# Patient Record
Sex: Male | Born: 1950 | Race: Black or African American | Hispanic: No | Marital: Married | Smoking: Never smoker
Health system: Southern US, Community
[De-identification: ages and names within clinical notes are randomized; demographics above are authoritative.]

## PROBLEM LIST (undated history)

## (undated) DIAGNOSIS — I10 Essential (primary) hypertension: Secondary | ICD-10-CM

## (undated) DIAGNOSIS — E7800 Pure hypercholesterolemia, unspecified: Secondary | ICD-10-CM

## (undated) DIAGNOSIS — N400 Enlarged prostate without lower urinary tract symptoms: Secondary | ICD-10-CM

## (undated) DIAGNOSIS — B009 Herpesviral infection, unspecified: Secondary | ICD-10-CM

## (undated) HISTORY — DX: Essential (primary) hypertension: I10

## (undated) HISTORY — DX: Benign prostatic hyperplasia without lower urinary tract symptoms: N40.0

## (undated) HISTORY — DX: Herpesviral infection, unspecified: B00.9

## (undated) HISTORY — DX: Pure hypercholesterolemia, unspecified: E78.00

## (undated) HISTORY — PX: CYST REMOVAL TRUNK: SHX6283

## (undated) HISTORY — PX: ELBOW SURGERY: SHX618

---

## 2002-12-22 ENCOUNTER — Encounter (INDEPENDENT_AMBULATORY_CARE_PROVIDER_SITE_OTHER): Payer: Self-pay | Admitting: *Deleted

## 2002-12-22 ENCOUNTER — Ambulatory Visit (HOSPITAL_BASED_OUTPATIENT_CLINIC_OR_DEPARTMENT_OTHER): Admission: RE | Admit: 2002-12-22 | Discharge: 2002-12-22 | Payer: Self-pay | Admitting: Orthopedic Surgery

## 2005-06-14 ENCOUNTER — Ambulatory Visit: Payer: Self-pay | Admitting: Pulmonary Disease

## 2006-09-02 ENCOUNTER — Ambulatory Visit: Payer: Self-pay | Admitting: Pulmonary Disease

## 2006-09-02 LAB — CONVERTED CEMR LAB
ALT: 27 units/L (ref 0–40)
Bacteria, U Microscopic: NEGATIVE /hpf
Basophils Relative: 0.3 % (ref 0.0–1.0)
Bilirubin Urine: NEGATIVE
Calcium: 9.3 mg/dL (ref 8.4–10.5)
Chloride: 104 meq/L (ref 96–112)
Chol/HDL Ratio, serum: 4.3
Cholesterol: 245 mg/dL (ref 0–200)
Eosinophil percent: 1.3 % (ref 0.0–5.0)
Glucose, Bld: 97 mg/dL (ref 70–99)
HCT: 40.3 % (ref 39.0–52.0)
HDL: 56.5 mg/dL (ref >39.0)
Hemoglobin, Urine: NEGATIVE
LDL DIRECT: 172.5 mg/dL
Lymphocytes Relative: 36.9 % (ref 12.0–46.0)
MCV: 91.9 fL (ref 78.0–100.0)
Monocytes Absolute: 0.4 10*3/uL (ref 0.2–0.7)
Nitrite: NEGATIVE
PSA: 0.67 ng/mL (ref 0.10–4.00)
TSH: 1.19 microintl units/mL (ref 0.35–5.50)
Total Protein, Urine: NEGATIVE mg/dL
Triglyceride fasting, serum: 66 mg/dL (ref 0–149)
Urobilinogen, UA: 0.2 (ref 0.0–1.0)
VLDL: 13 mg/dL (ref 0–40)
WBC: 3.9 10*3/uL — ABNORMAL LOW (ref 4.5–10.5)
pH: 7.5 (ref 5.0–8.0)

## 2007-10-21 DIAGNOSIS — E7800 Pure hypercholesterolemia, unspecified: Secondary | ICD-10-CM | POA: Insufficient documentation

## 2007-11-24 ENCOUNTER — Ambulatory Visit: Payer: Self-pay | Admitting: Pulmonary Disease

## 2007-11-24 DIAGNOSIS — B009 Herpesviral infection, unspecified: Secondary | ICD-10-CM | POA: Insufficient documentation

## 2007-12-09 ENCOUNTER — Telehealth: Payer: Self-pay | Admitting: Pulmonary Disease

## 2009-01-05 ENCOUNTER — Ambulatory Visit: Payer: Self-pay | Admitting: Pulmonary Disease

## 2009-01-05 DIAGNOSIS — I10 Essential (primary) hypertension: Secondary | ICD-10-CM | POA: Insufficient documentation

## 2009-01-09 LAB — CONVERTED CEMR LAB
ALT: 21 units/L (ref 0–53)
AST: 24 units/L (ref 0–37)
BUN: 9 mg/dL (ref 6–23)
Eosinophils Relative: 1.7 % (ref 0.0–5.0)
GFR calc non Af Amer: 82.28 mL/min (ref >60)
HCT: 40.7 % (ref 39.0–52.0)
Hemoglobin: 13.8 g/dL (ref 13.0–17.0)
Ketones, ur: NEGATIVE mg/dL
LDL Cholesterol: 136 mg/dL — ABNORMAL HIGH (ref 0–99)
Lymphs Abs: 1.4 10*3/uL (ref 0.7–4.0)
Monocytes Relative: 11.6 % (ref 3.0–12.0)
Platelets: 231 10*3/uL (ref 150.0–400.0)
Potassium: 4.1 meq/L (ref 3.5–5.1)
RBC: 4.4 M/uL (ref 4.22–5.81)
Sodium: 139 meq/L (ref 135–145)
Specific Gravity, Urine: 1.01 (ref 1.000–1.030)
TSH: 1.28 microintl units/mL (ref 0.35–5.50)
Total Bilirubin: 0.9 mg/dL (ref 0.3–1.2)
Total CHOL/HDL Ratio: 4
Urine Glucose: NEGATIVE mg/dL
Urobilinogen, UA: 0.2 (ref 0.0–1.0)
VLDL: 12.6 mg/dL (ref 0.0–40.0)
WBC: 4 10*3/uL — ABNORMAL LOW (ref 4.5–10.5)
pH: 7 (ref 5.0–8.0)

## 2009-02-09 ENCOUNTER — Telehealth: Payer: Self-pay | Admitting: Pulmonary Disease

## 2009-08-09 ENCOUNTER — Telehealth: Payer: Self-pay | Admitting: Pulmonary Disease

## 2009-08-09 ENCOUNTER — Ambulatory Visit: Payer: Self-pay | Admitting: Pulmonary Disease

## 2009-08-09 LAB — CONVERTED CEMR LAB
Cholesterol: 171 mg/dL (ref 0–200)
HDL: 53.2 mg/dL (ref >39.00)
Triglycerides: 41 mg/dL (ref 0.0–149.0)

## 2010-03-01 ENCOUNTER — Telehealth (INDEPENDENT_AMBULATORY_CARE_PROVIDER_SITE_OTHER): Payer: Self-pay | Admitting: *Deleted

## 2010-03-03 ENCOUNTER — Ambulatory Visit: Payer: Self-pay | Admitting: Pulmonary Disease

## 2010-03-06 ENCOUNTER — Ambulatory Visit: Payer: Self-pay | Admitting: Pulmonary Disease

## 2010-03-07 LAB — CONVERTED CEMR LAB
ALT: 20 units/L (ref 0–53)
AST: 25 units/L (ref 0–37)
Albumin: 4.1 g/dL (ref 3.5–5.2)
Basophils Absolute: 0 10*3/uL (ref 0.0–0.1)
CO2: 30 meq/L (ref 19–32)
Calcium: 9.2 mg/dL (ref 8.4–10.5)
Chloride: 108 meq/L (ref 96–112)
Cholesterol: 194 mg/dL (ref 0–200)
Eosinophils Relative: 2.8 % (ref 0.0–5.0)
Monocytes Relative: 11.5 % (ref 3.0–12.0)
Neutrophils Relative %: 48.4 % (ref 43.0–77.0)
Nitrite: NEGATIVE
PSA: 0.9 ng/mL (ref 0.10–4.00)
Platelets: 251 10*3/uL (ref 150.0–400.0)
Sodium: 142 meq/L (ref 135–145)
Specific Gravity, Urine: 1.02 (ref 1.000–1.030)
TSH: 1.08 microintl units/mL (ref 0.35–5.50)
Total Protein: 7.6 g/dL (ref 6.0–8.3)
Urine Glucose: NEGATIVE mg/dL
Urobilinogen, UA: 0.2 (ref 0.0–1.0)
VLDL: 17.2 mg/dL (ref 0.0–40.0)
WBC: 4.3 10*3/uL — ABNORMAL LOW (ref 4.5–10.5)

## 2010-09-24 LAB — CONVERTED CEMR LAB
ALT: 22 units/L (ref 0–53)
Alkaline Phosphatase: 57 units/L (ref 39–117)
BUN: 7 mg/dL (ref 6–23)
Basophils Relative: 0.5 % (ref 0.0–1.0)
Bilirubin, Direct: 0.1 mg/dL (ref 0.0–0.3)
CO2: 33 meq/L — ABNORMAL HIGH (ref 19–32)
Crystals: NEGATIVE
Eosinophils Absolute: 0.1 10*3/uL (ref 0.0–0.7)
Eosinophils Relative: 1.9 % (ref 0.0–5.0)
Glucose, Bld: 93 mg/dL (ref 70–99)
HCT: 40.3 % (ref 39.0–52.0)
HDL: 56.2 mg/dL (ref >39.0)
Hemoglobin, Urine: NEGATIVE
Hemoglobin: 13.4 g/dL (ref 13.0–17.0)
MCV: 93.2 fL (ref 78.0–100.0)
Monocytes Absolute: 0.4 10*3/uL (ref 0.1–1.0)
Monocytes Relative: 10.9 % (ref 3.0–12.0)
Mucus, UA: NEGATIVE
Platelets: 233 10*3/uL (ref 150–400)
Potassium: 4.1 meq/L (ref 3.5–5.1)
RBC / HPF: NONE SEEN
RBC: 4.33 M/uL (ref 4.22–5.81)
Sodium: 143 meq/L (ref 135–145)
Total CHOL/HDL Ratio: 2.9
Total Protein, Urine: NEGATIVE mg/dL
Total Protein: 7.9 g/dL (ref 6.0–8.3)
Urine Glucose: NEGATIVE mg/dL
Urobilinogen, UA: 0.2 (ref 0.0–1.0)
WBC: 4 10*3/uL — ABNORMAL LOW (ref 4.5–10.5)

## 2010-09-26 NOTE — Assessment & Plan Note (Signed)
Summary: 6 months/apc   CC:  7 month ROV & review....  History of Present Illness: 60 y/o BM here for a follow up visit.. he has multiple medical problems as noted below... Followed for general medical purposes w/ hx of HBP, Hyperchol, BPH & hx HSVII.Marland Kitchen.   ~  August 09, 2009:  he has been taking the AZOR regularly & tol well- BP's at home have been 120-130's/ 70-80's... due for f/u FLP...   ~  March 06, 2010:  he noted some self-limited upper back pain several months ago- now resolved w/ heat & Ibuprofen... BP remains controlled on AZOR, but LDL is still sl elevated on Simva20 so we decided to incr to Simva40... no other complaints or concerns...    Current Problems:   PHYSICAL EXAMINATION (ICD-V70.0) - he is allergic to Sulfa drugs... he had routine colonoscopy by GI in [place] (DrMixon) 9/07 and it was neg... f/u planned 9yrs... I've reiterated my rec for ASA 81mg /d and MVI...  HYPERTENSION (ICD-401.9) - he's had borderline BP's on prev office visits in the past- up to 140's / 90's... BP today = 140/82 and he notes similar at home... denies HA, fatigue, visual changes, CP, palipit, dizziness, syncope, dyspnea, edema, etc...    ~  5/10:  we decided to start therapy w/ combination product- AZOR 5/40 one tab daily...  ~  12/10: he reports good BP control at home & reads 130/82 here today- tol Rx well, continue same.  ~  7/11: remains stable on the AZOR Rx> 140/82 & similar at home.  HYPERCHOLESTEROLEMIA (ICD-272.0) - on SIMVASTATIN 20mg /d... he is allergic to Lipitor w/ rash...  ~  FLP 1/08 showed TChol 245, TG 66, HDL 57, LDL 173... on diet alone, started Zocor 20/d...  ~  FLP 3/09 showed TChol 163, TG 96, HDL 56, LDL 88... rec> continue Simva20.  ~  FLP 5/10 on diet alone showed TChol 199, TG 63, HDL 51, LDL 136... rec restart SIMVA20.  ~  FLP 12/10 showed TChol 171, TG 41, HDL 53, LDL 110... continue Simva20 + diet.  ~  FLP 7/11 showed TChol 194, TG 86, HDL 58, LDL 119... he wants to get  LDL<100, rec incr Simva40.  BENIGN PROSTATIC HYPERTROPHY, MILD, HX OF (ICD-V13.8) - eval by DrCarbon @ [facility] and [facility] in [facility] urology w/ prev cysto... all symptoms resolved and denies current problems...  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 he gets occas outbreaks (?4-6/yr) and requests suppressive therapy- Rx Famvir 250mg Bid Prn.   Preventive Screening-Counseling & Management  Alcohol-Tobacco     Smoking Status: never  Allergies: 1)  ! Sulfa 2)  ! Lipitor (Atorvastatin Calcium)  Comments:  Nurse/Medical Assistant: The patient's medications and allergies were reviewed with the patient and were updated in the Medication and Allergy Lists.  Past History:  Past Medical History: HYPERTENSION (ICD-401.9) HYPERCHOLESTEROLEMIA (ICD-272.0) BENIGN PROSTATIC HYPERTROPHY, MILD, HX OF (ICD-V13.8) Hx of HSV (ICD-054.9)  Family History: Reviewed history from 01/05/2009 and no changes required. Father died age 49 w/ heart dis & CHF Mother died age 56 w/ stroke, hx heart dis, HBP 9 Siblings:  he is the youngest of 38... 2 Brothers passed- 1 age 58 ?cause, 1 died from lymphoma 7 Sibs alive in good health- 1 w/ DM  Social History: Reviewed history from 01/05/2009 and no changes required. Married, wife= Windell Moulding, 12 yrs... 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  Review of Systems  The patient denies fever, chills, sweats, anorexia, fatigue, weakness, malaise, weight loss, sleep disorder, blurring, diplopia, eye irritation, eye discharge, vision loss, eye pain, photophobia, earache, ear discharge, tinnitus, decreased hearing, nasal congestion, nosebleeds, sore throat, hoarseness, chest pain, palpitations, syncope, dyspnea on exertion, orthopnea, PND, peripheral edema, cough, dyspnea at rest, excessive sputum, hemoptysis, wheezing, pleurisy, nausea, vomiting,  diarrhea, constipation, change in bowel habits, abdominal pain, melena, hematochezia, jaundice, gas/bloating, indigestion/heartburn, dysphagia, odynophagia, dysuria, hematuria, urinary frequency, urinary hesitancy, nocturia, incontinence, back pain, joint pain, joint swelling, muscle cramps, muscle weakness, stiffness, arthritis, sciatica, restless legs, leg pain at night, leg pain with exertion, rash, itching, dryness, suspicious lesions, paralysis, paresthesias, seizures, tremors, vertigo, transient blindness, frequent falls, frequent headaches, difficulty walking, depression, anxiety, memory loss, confusion, cold intolerance, heat intolerance, polydipsia, polyphagia, polyuria, unusual weight change, abnormal bruising, bleeding, enlarged lymph nodes, urticaria, allergic rash, hay fever, and recurrent infections.    Vital Signs:  Patient profile:   60 year old male Height:      66 inches Weight:      175.50 pounds BMI:     28.43 O2 Sat:      99 % on Room air Temp:     97.8 degrees F oral Pulse rate:   57 / minute BP sitting:   140 / 82  (right arm) Cuff size:   regular  Vitals Entered By: Randell Loop CMA (March 06, 2010 2:16 PM)  O2 Sat at Rest %:  99 O2 Flow:  Room air CC: 7 month ROV & review... Is Patient Diabetic? No Pain Assessment Patient in pain? no      Comments no changes in meds today   Physical Exam  Additional Exam:  WD, WN, 60 y/o BM in NAD... GENERAL:  Alert & oriented; pleasant & cooperative... HEENT:  [place]/AT, EOM-wnl, PERRLA, Fundi-benign, EACs-clear, TMs-wnl, NOSE-clear, THROAT-clear & wnl. NECK:  Supple w/ full ROM; no JVD; normal carotid impulses w/o bruits; no thyromegaly or nodules palpated; no lymphadenopathy. CHEST:  Clear to P & A; without wheezes/ rales/ or rhonchi. HEART:  Regular Rhythm; without murmurs/ rubs/ or gallops. ABDOMEN:  Soft & nontender; normal bowel sounds; no organomegaly or masses detected. (RECTAL:  Neg - prostate 3+ & nontender w/o  nodules; stool hematest neg) EXT: without deformities or arthritic changes; no varicose veins/ venous insuffic/ or edema. NEURO:  CN's intact; motor testing normal; sensory testing normal; gait normal & balance OK. DERM:  No lesions noted; no rash etc...    MISC. Report  Procedure date:  03/03/2010  Findings:      Hepatic/Liver Function Panel (HEPATIC)   Total Bilirubin           0.7 mg/dL                   5.7-8.4   Direct Bilirubin          0.1 mg/dL                   6.9-6.2   Alkaline Phosphatase      53 U/L                      39-117   AST                       25 U/L  0-37   ALT                       20 U/L                      0-53   Total Protein             7.6 g/dL                    1.6-1.0   Albumin                   4.1 g/dL                    9.6-0.4  CBC Platelet w/Diff (CBCD)   White Cell Count     [L]  4.3 K/uL                    4.5-10.5   Red Cell Count       [L]  4.12 Mil/uL                 4.22-5.81   Hemoglobin                13.1 g/dL                   54.0-98.1   Hematocrit           [L]  38.4 %                      39.0-52.0   MCV                       93.1 fl                     78.0-100.0   Platelet Count            251.0 K/uL                  150.0-400.0   Neutrophil %              48.4 %                      43.0-77.0   Lymphocyte %              36.4 %                      12.0-46.0   Monocyte %                11.5 %                      3.0-12.0   Eosinophils%              2.8 %                       0.0-5.0   Basophils %               0.9 %                       0.0-3.0  TSH (TSH)   FastTSH                   1.08 uIU/mL  0.35-5.50  BMP (METABOL)   Sodium                    142 mEq/L                   135-145   Potassium                 4.4 mEq/L                   3.5-5.1   Chloride                  108 mEq/L                   96-112   Carbon Dioxide            30 mEq/L                    19-32   Glucose                    97 mg/dL                    04-54   BUN                       10 mg/dL                    0-98   Creatinine                0.9 mg/dL                   1.1-9.1   Calcium                   9.2 mg/dL                   4.7-82.9   GFR                       90.22 mL/min                >60  Comments:      UDip Only (UDIP)   Color                     LT. YELLOW   Clarity                   CLEAR                       Clear   Specific Gravity          1.020                       1.000 - 1.030   Urine Ph                  7.0                         5.0-8.0   Protein                   NEGATIVE                    Negative   Urine Glucose  NEGATIVE                    Negative   Ketones                   NEGATIVE                    Negative   Urine Bilirubin           NEGATIVE                    Negative   Blood                     NEGATIVE                    Negative   Urobilinogen              0.2                         0.0 - 1.0   Leukocyte Esterace        NEGATIVE                    Negative   Nitrite                   NEGATIVE                    Negative  Prostate Specific Antigen (PSA)   PSA-Hyb                   0.90 ng/mL                  0.10-4.00  Lipid Panel (LIPID)   Cholesterol               194 mg/dL                   8-756   Triglycerides             86.0 mg/dL                  4.3-329.5   HDL                       18.84 mg/dL                 >16.60   LDL Cholesterol      [H]  630 mg/dL                   1-60   Impression & Recommendations:  Problem # 1:  HYPERTENSION (ICD-401.9) Controlled>  same Rx, refilled. His updated medication list for this problem includes:    Azor 5-40 Mg Tabs (Amlodipine-olmesartan) .Marland Kitchen... Take 1 tab by mouth once daily...  Problem # 2:  HYPERCHOLESTEROLEMIA (ICD-272.0) We reviewed FLP & LDL 119... he wants to get his numbers closer to goal <100...  REC> increase SIMVA40... His updated medication list for this problem  includes:    Simvastatin 40 Mg Tabs (Simvastatin) .Marland Kitchen... Take 1 tab by mouth at bedtime...  Problem # 3:  BENIGN PROSTATIC HYPERTROPHY, MILD, HX OF (ICD-V13.8) He is asymptomatic  and PSA = 0.90...  Complete Medication List: 1)  Azor 5-40 Mg Tabs (Amlodipine-olmesartan) .... Take 1 tab by mouth once daily.Marland KitchenMarland Kitchen 2)  Simvastatin 40 Mg Tabs (Simvastatin) .... Take  1 tab by mouth at bedtime.Marland KitchenMarland Kitchen 3)  Famvir 250 Mg Tabs (Famciclovir) .Marland Kitchen.. 1 tab by mouth two times a day as directed...  Patient Instructions: 1)  Today we updated your med list- see below.... 2)  We decided to increase the Simvastatin to 40mg  at bedtime.Marland KitchenMarland Kitchen 3)  Continue your low cholesterol/ low fat diet.Marland KitchenMarland Kitchen 4)  Monitor your BP at home & call for questions.Marland KitchenMarland Kitchen 5)  Please schedule a follow-up appointment in 1 year. Prescriptions: SIMVASTATIN 40 MG TABS (SIMVASTATIN) take 1 tab by mouth at bedtime...  #30 x 12   Entered and Authorized by:   Michele Mcalpine MD   Signed by:   Michele Mcalpine MD on 03/06/2010   Method used:   Print then Give to Patient   RxID:   [id number] AZOR 5-40 MG TABS (AMLODIPINE-OLMESARTAN) take 1 tab by mouth once daily...  #30 x 12   Entered and Authorized by:   Michele Mcalpine MD   Signed by:   Michele Mcalpine MD on 03/06/2010   Method used:   Print then Give to Patient   RxID:   [id number]

## 2010-09-26 NOTE — Progress Notes (Signed)
Summary: put in labs  Phone Note Call from Patient Call back at Home Phone [phone]   Caller: Patient Call For: nadel Summary of Call: pt has appt 7/11. wants to do his labs this week.  Initial call taken by: Tivis Ringer, CNA,  March 01, 2010 3:13 PM  Follow-up for Phone Call        last full labs done 5.2010.  order placed in idx fro hepatic, tsh, udip, psa, cbcd, flp, bmet (same labs done last year).  pt aware order has been placed, the time the lab opens and to arrive for lab work fasting.  pt verbalized his understanding. Follow-up by: Boone Master CNA/MA,  March 01, 2010 3:20 PM

## 2010-11-22 ENCOUNTER — Other Ambulatory Visit: Payer: Self-pay | Admitting: Pulmonary Disease

## 2011-01-12 NOTE — Op Note (Signed)
   NAME:  Jared Roth, BERGFELD                            ACCOUNT NO.:  [id number]   MEDICAL RECORD NO.:  [id number]                   PATIENT TYPE:  AMB   LOCATION:  DSC                                  FACILITY:  [facility]   PHYSICIAN:  Leonides Grills, M.D.                  DATE OF BIRTH:  [birth year]   DATE OF PROCEDURE:  12/22/2002  DATE OF DISCHARGE:                                 OPERATIVE REPORT   PREOPERATIVE DIAGNOSIS:  Left second metatarsal osteochondroma.   POSTOPERATIVE DIAGNOSIS:  Most likely left second metatarsal osteochondroma.   PROCEDURE:  Excision, deep left second metatarsal osteochondroma.   ANESTHESIA:  General endotracheal tube with popliteal block.   SURGEON:  Leonides Grills, M.D.   ASSISTANT:  Lianne Cure, P.A.   ESTIMATED BLOOD LOSS:  Minimal.   COMPLICATIONS:  None.   DISPOSITION:  Stable to the PAR.   INDICATIONS:  This is a 59 year old male who has had a slow-growing mass on  the left second metatarsal that has been symptomatic and interfering with  his life.  After obtaining an MRI that was consistent with an  osteochondroma, he was consented for the above procedure.  All risks, which  include infection, nerve or vessel injury, possibility of malignancy with  future surgery, pathologic fracture, swelling, and recurrence, were all  explained and questions encouraged and answered.   DESCRIPTION OF PROCEDURE:  Patient brought to the operating room and placed  in supine position after adequate general endotracheal tube anesthesia was  administered as well as a popliteal block.  The left lower extremity was  then prepped and draped in a sterile manner over a proximally-placed thigh  tourniquet.  The limb was gravity-exsanguinated and the tourniquet was  elevated to 290 mmHg.  A longitudinal incision over the lesion was then made  dorsally.  Dissection was carried down through soft tissue.  A longitudinal  incision over the incision was then made dorsally.   Dissection was carried  down through soft tissue.  Soft tissues were dissected both medially and  laterally around the lesion.  Once the lesion was isolated out with  meticulous dissection, a curved quarter-inch osteotome was used and at the  base of the lesion, this was osteotomized.  The wound was copiously  irrigated with normal saline, the tourniquet was deflated, hemostasis was  obtained.  The wound was closed with 4-0 nylon suture.  A sterile dressing  was applied.  The patient was stable to the PAR.                                               Leonides Grills, M.D.    PB/MEDQ  D:  12/22/2002  T:  12/22/2002  Job:  [id number]

## 2011-02-01 ENCOUNTER — Encounter: Payer: Self-pay | Admitting: *Deleted

## 2011-03-06 ENCOUNTER — Ambulatory Visit: Payer: Self-pay | Admitting: Pulmonary Disease

## 2011-03-23 ENCOUNTER — Ambulatory Visit (INDEPENDENT_AMBULATORY_CARE_PROVIDER_SITE_OTHER): Payer: Managed Care, Other (non HMO) | Admitting: Pulmonary Disease

## 2011-03-23 ENCOUNTER — Ambulatory Visit (INDEPENDENT_AMBULATORY_CARE_PROVIDER_SITE_OTHER)
Admission: RE | Admit: 2011-03-23 | Discharge: 2011-03-23 | Disposition: A | Discharge status: Home / Self Care | Payer: Managed Care, Other (non HMO) | Source: Ambulatory Visit | Attending: Pulmonary Disease | Admitting: Pulmonary Disease

## 2011-03-23 ENCOUNTER — Other Ambulatory Visit (INDEPENDENT_AMBULATORY_CARE_PROVIDER_SITE_OTHER): Payer: Managed Care, Other (non HMO)

## 2011-03-23 ENCOUNTER — Other Ambulatory Visit (INDEPENDENT_AMBULATORY_CARE_PROVIDER_SITE_OTHER): Payer: Managed Care, Other (non HMO) | Admitting: Pulmonary Disease

## 2011-03-23 ENCOUNTER — Encounter: Payer: Self-pay | Admitting: Pulmonary Disease

## 2011-03-23 VITALS — BP 144/88 | HR 56 | Temp 97.5°F | Ht 66.0 in | Wt 175.6 lb

## 2011-03-23 DIAGNOSIS — Z0000 Encounter for general adult medical examination without abnormal findings: Secondary | ICD-10-CM

## 2011-03-23 DIAGNOSIS — Z87898 Personal history of other specified conditions: Secondary | ICD-10-CM

## 2011-03-23 DIAGNOSIS — E7800 Pure hypercholesterolemia, unspecified: Secondary | ICD-10-CM

## 2011-03-23 DIAGNOSIS — I10 Essential (primary) hypertension: Secondary | ICD-10-CM

## 2011-03-23 LAB — HEPATIC FUNCTION PANEL
ALT: 26 U/L (ref 0–53)
Alkaline Phosphatase: 54 U/L (ref 39–117)
Bilirubin, Direct: 0.1 mg/dL (ref 0.0–0.3)
Total Protein: 7.7 g/dL (ref 6.0–8.3)

## 2011-03-23 LAB — BASIC METABOLIC PANEL
CO2: 30 mEq/L (ref 19–32)
Chloride: 104 mEq/L (ref 96–112)
Potassium: 4.3 mEq/L (ref 3.5–5.1)
Sodium: 141 mEq/L (ref 135–145)

## 2011-03-23 LAB — CBC WITH DIFFERENTIAL/PLATELET
Basophils Absolute: 0.1 10*3/uL (ref 0.0–0.1)
Eosinophils Absolute: 0.1 10*3/uL (ref 0.0–0.7)
Lymphocytes Relative: 33.6 % (ref 12.0–46.0)
MCHC: 33.8 g/dL (ref 30.0–36.0)
MCV: 92.7 fl (ref 78.0–100.0)
Monocytes Absolute: 0.4 10*3/uL (ref 0.1–1.0)
Neutro Abs: 2.1 10*3/uL (ref 1.4–7.7)
Neutrophils Relative %: 52.4 % (ref 43.0–77.0)
RDW: 13 % (ref 11.5–14.6)

## 2011-03-23 LAB — URINALYSIS, ROUTINE W REFLEX MICROSCOPIC
Ketones, ur: NEGATIVE
Specific Gravity, Urine: >=1.03 (ref 1.000–1.030)
Urine Glucose: NEGATIVE
pH: 6 (ref 5.0–8.0)

## 2011-03-23 LAB — LIPID PANEL
Total CHOL/HDL Ratio: 3
Triglycerides: 66 mg/dL (ref 0.0–149.0)

## 2011-03-23 MED ORDER — AMLODIPINE-OLMESARTAN 5-40 MG PO TABS: 1.0000 | ORAL_TABLET | Freq: Every day | ORAL | Status: DC

## 2011-03-23 MED ORDER — FAMCICLOVIR 250 MG PO TABS: 250.0000 mg | ORAL_TABLET | Freq: Two times a day (BID) | ORAL | Status: DC

## 2011-03-23 MED ORDER — SIMVASTATIN 20 MG PO TABS: 20.0000 mg | ORAL_TABLET | Freq: Every day | ORAL | Status: DC

## 2011-03-23 NOTE — Progress Notes (Signed)
Subjective:    Patient ID: Jared Roth, male    DOB: 06-19-1951, 60 y.o.   MRN: [id number]  HPI 60 y/o BM here for a follow up visit.. he has multiple medical problems as noted below... Followed for general medical purposes w/ hx of HBP, Hyperchol, BPH & hx HSVII.Marland Kitchen.  ~  August 09, 2009:  he has been taking the AZOR regularly & tol well- BP's at home have been 120-130's/ 70-80's... due for f/u FLP...  ~  March 06, 2010:  he noted some self-limited upper back pain several months ago- now resolved w/ heat & Ibuprofen... BP remains controlled on AZOR, but LDL is still sl elevated on Simva20 so we decided to incr to Simva40... no other complaints or concerns...  ~  March 23, 2011:  Yearly ROV & CPX> he notes some intermittent heartburn that he relates to emotional upset & it affects his breathing, no CP/ palpit/ etc; occurs 1-2 per week, lasts a few seconds, & relief is spont; we discussed further eval if it increasing in freq or severity, for now try Prilosec OTC before breakfast...  BP appears well controlled on AZOR;  Chol well managed on the Simva40;  The only issue is med compliance> not taking them every day & he is encouraged to take regularly...   Problem List:   PHYSICAL EXAMINATION (ICD-V70.0) - he is allergic to Sulfa drugs... he had routine colonoscopy by GI in [place] (DrMixon) 9/07 and it was neg... f/u planned 63yrs... I've reiterated my rec for ASA 81mg /d and MVI... ~  CXR 7/12 showed normal heart size, clear lungs, NAD... ~  EKG 7/12 showed NSR, WNL...  HYPERTENSION (ICD-401.9) - on AZOR 5-40 daily & tol well... BP today = 144/88 but he ran out of med several days ago... denies HA, fatigue, visual changes, CP, palipit, dizziness, syncope, dyspnea, edema, etc...   ~  5/10:  we decided to start therapy w/ combination product- AZOR 5/40 one tab daily... ~  12/10: he reports good BP control at home & reads 130/82 here today- tol Rx well, continue same. ~  7/11: remains stable on the  AZOR Rx> 140/82 & similar at home. ~  7/12: stable on AZOR 5-40 per his home BP checks; BP 144/88 in office but ran out several days he said.  HYPERCHOLESTEROLEMIA (ICD-272.0) - on SIMVASTATIN 40mg /d... he is allergic to Lipitor w/ rash... ~  FLP 1/08 showed TChol 245, TG 66, HDL 57, LDL 173... on diet alone, started Zocor 20/d... ~  FLP 3/09 showed TChol 163, TG 96, HDL 56, LDL 88... rec> continue Simva20. ~  FLP 5/10 on diet alone showed TChol 199, TG 63, HDL 51, LDL 136... rec restart SIMVA20. ~  FLP 12/10 showed TChol 171, TG 41, HDL 53, LDL 110... continue Simva20 + diet. ~  FLP 7/11 on Simva20 showed TChol 194, TG 86, HDL 58, LDL 119... he wants to get LDL<100, rec incr Simva40. ~  FLP 7/12 on Simva40 showed TChol 166, TG 66, HDL 56, LDL 96... Continue same + diet...  BENIGN PROSTATIC HYPERTROPHY, MILD, HX OF (ICD-V13.8) - eval by DrCarbon @ [facility] and Dr[facility] in [facility] urology w/ prev cysto... all symptoms resolved and denies current problems... 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 he gets occas outbreaks (?4-6/yr) and requests suppressive therapy- Rx Famvir 250mg Bid Prn.   No past surgical history on file.   Outpatient Encounter Prescriptions  as of 03/23/2011  Medication Sig Dispense Refill  . amLODipine-olmesartan (AZOR) 5-40 MG per tablet Take 1 tablet by mouth daily.  30 tablet  11  . famciclovir (FAMVIR) 250 MG tablet Take 1 tablet (250 mg total) by mouth 2 (two) times daily.  60 tablet  11  . simvastatin (ZOCOR) 40 MG tablet Take 1 tablet (40 mg total) by mouth at bedtime.  30 tablet  11    Allergies  Allergen Reactions  . Atorvastatin     REACTION: INTOL Lipitor w/ rash  . Sulfonamide Derivatives     REACTION: as a child    Current Medications, Allergies, Past Medical History, Past Surgical History, Family History, and Social History were reviewed in [employer] record.    Review of  Systems    The patient denies fever, chills, sweats, anorexia, fatigue, weakness, malaise, weight loss, sleep disorder, blurring, diplopia, eye irritation, eye discharge, vision loss, eye pain, photophobia, earache, ear discharge, tinnitus, decreased hearing, nasal congestion, nosebleeds, sore throat, hoarseness, chest pain, palpitations, syncope, dyspnea on exertion, orthopnea, PND, peripheral edema, cough, dyspnea at rest, excessive sputum, hemoptysis, wheezing, pleurisy, nausea, vomiting, diarrhea, constipation, change in bowel habits, abdominal pain, melena, hematochezia, jaundice, gas/bloating, indigestion/heartburn, dysphagia, odynophagia, dysuria, hematuria, urinary frequency, urinary hesitancy, nocturia, incontinence, back pain, joint pain, joint swelling, muscle cramps, muscle weakness, stiffness, arthritis, sciatica, restless legs, leg pain at night, leg pain with exertion, rash, itching, dryness, suspicious lesions, paralysis, paresthesias, seizures, tremors, vertigo, transient blindness, frequent falls, frequent headaches, difficulty walking, depression, anxiety, memory loss, confusion, cold intolerance, heat intolerance, polydipsia, polyphagia, polyuria, unusual weight change, abnormal bruising, bleeding, enlarged lymph nodes, urticaria, allergic rash, hay fever, and recurrent infections.     Objective:   Physical Exam      WD, WN, 60 y/o BM in NAD... GENERAL:  Alert & oriented; pleasant & cooperative... HEENT:  [place]/AT, EOM-wnl, PERRLA, Fundi-benign, EACs-clear, TMs-wnl, NOSE-clear, THROAT-clear & wnl. NECK:  Supple w/ full ROM; no JVD; normal carotid impulses w/o bruits; no thyromegaly or nodules palpated; no lymphadenopathy. CHEST:  Clear to P & A; without wheezes/ rales/ or rhonchi. HEART:  Regular Rhythm; without murmurs/ rubs/ or gallops. ABDOMEN:  Soft & nontender; normal bowel sounds; no organomegaly or masses detected. (RECTAL:  Neg - prostate 3+ & nontender w/o nodules; stool  hematest neg) EXT: without deformities or arthritic changes; no varicose veins/ venous insuffic/ or edema. NEURO:  CN's intact; motor testing normal; sensory testing normal; gait normal & balance OK. DERM:  No lesions noted; no rash etc...   Assessment & Plan:   CPX>  Good general health; not sure what to make of his symptom- very brief (just seconds), resolves spont, only 1-2/wk- so we decided to try OTC Prilosec & rec further eval if persistant or worsening symptoms...  HBP>  AZOR appears to be working well> rec taking it every day!  CHOL>  Simva40 working satis & again rec taking every day + diet & exercise discussed...  GU>  Stable & asymptomatic at present; PSA is wnl.Marland KitchenMarland Kitchen

## 2011-03-23 NOTE — Patient Instructions (Signed)
Today we updated your med list in EPIC...    Be sure to take your meds regularly!!!  Today we did your follow up CXR & fasting blood work...    Please call the PHONE TREE in a few days for your results...    Dial [id number] & when prompted enter your patient number followed by the # symbol...    Your patient number is:   [id number]#  Call for any problems.Marland KitchenMarland Kitchen

## 2011-03-25 ENCOUNTER — Encounter: Payer: Self-pay | Admitting: Pulmonary Disease

## 2011-03-26 ENCOUNTER — Other Ambulatory Visit: Payer: Self-pay | Admitting: *Deleted

## 2011-03-26 MED ORDER — SIMVASTATIN 40 MG PO TABS: 20.0000 mg | ORAL_TABLET | Freq: Every day | ORAL | Status: DC

## 2011-03-27 ENCOUNTER — Other Ambulatory Visit: Payer: Self-pay | Admitting: *Deleted

## 2011-03-27 MED ORDER — SIMVASTATIN 40 MG PO TABS: 40.0000 mg | ORAL_TABLET | Freq: Every day | ORAL | Status: DC

## 2011-06-21 ENCOUNTER — Telehealth: Payer: Self-pay | Admitting: Pulmonary Disease

## 2011-06-21 NOTE — Telephone Encounter (Signed)
LMOM for pt TCB.  Reviewed pt's chart, I don't believe he has ever been on either of these meds before.

## 2011-06-22 MED ORDER — TADALAFIL 20 MG PO TABS: ORAL_TABLET | ORAL | Status: DC

## 2011-06-22 NOTE — Telephone Encounter (Signed)
Called, spoke with pt.  Informed SN is ok with the cialis 20 mg.  He verbalized understanding of this and aware rx sent to [facility].

## 2011-06-22 NOTE — Telephone Encounter (Signed)
Per SN---ok for cialis 20mg    #10  Take as directed and refill prn.  thanks

## 2011-06-22 NOTE — Telephone Encounter (Signed)
Called, spoke with pt.  He is requesting a rx for cialis to try this medication.  States approx 2-3 years ago he was given a sample of viagra from the urologist but after taking it, his vision was "bright."  Dr. Kriste Basque, pls advise. Thanks!  [facility] [address]

## 2011-09-11 ENCOUNTER — Ambulatory Visit: Payer: Managed Care, Other (non HMO) | Admitting: Adult Health

## 2011-09-12 ENCOUNTER — Ambulatory Visit: Payer: Managed Care, Other (non HMO) | Admitting: Adult Health

## 2011-11-19 ENCOUNTER — Ambulatory Visit: Payer: Managed Care, Other (non HMO) | Admitting: Adult Health

## 2011-11-20 ENCOUNTER — Encounter: Payer: Self-pay | Admitting: Adult Health

## 2011-11-20 ENCOUNTER — Ambulatory Visit (INDEPENDENT_AMBULATORY_CARE_PROVIDER_SITE_OTHER): Payer: Managed Care, Other (non HMO) | Admitting: Adult Health

## 2011-11-20 DIAGNOSIS — R079 Chest pain, unspecified: Secondary | ICD-10-CM

## 2011-11-20 DIAGNOSIS — K219 Gastro-esophageal reflux disease without esophagitis: Secondary | ICD-10-CM

## 2011-11-20 NOTE — Progress Notes (Signed)
Subjective:    Patient ID: Jared Roth, male    DOB: 12-13-1950, 61 y.o.   MRN: [id number]  Shortness of Breath   61 y/o BM here for a follow up visit.. he has multiple medical problems as noted below... Followed for general medical purposes w/ hx of HBP, Hyperchol, BPH & hx HSVII.Marland Kitchen.  ~  August 09, 2009:  he has been taking the AZOR regularly & tol well- BP's at home have been 120-130's/ 70-80's... due for f/u FLP...  ~  March 06, 2010:  he noted some self-limited upper back pain several months ago- now resolved w/ heat & Ibuprofen... BP remains controlled on AZOR, but LDL is still sl elevated on Simva20 so we decided to incr to Simva40... no other complaints or concerns...  ~  March 23, 2011:  Yearly ROV & CPX> he notes some intermittent heartburn that he relates to emotional upset & it affects his breathing, no CP/ palpit/ etc; occurs 1-2 per week, lasts a few seconds, & relief is spont; we discussed further eval if it increasing in freq or severity, for now try Prilosec OTC before breakfast...  BP appears well controlled on AZOR;  Chol well managed on the Simva40;  The only issue is med compliance> not taking them every day & he is encouraged to take regularly...    11/20/2011  Pt c/o sob x7-2mo. Pt states that the "attacks" occur with/without exertion.  Occurs for few seconds. Comes and goes. Xray last ov was neg. Labs unrevealing Some anxiety, EKG was okay. Had this last ov with Dr. Kriste Basque  . Was recommended to take Prilosec , unsure if it helped.  Has a lot of reflux , heartburn and indigestion. 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 Has occasional Trouble swallowing . No choking.  No hemoptysis, exertional chest pain , n/v/d, edema.     Problem List:   PHYSICAL EXAMINATION (ICD-V70.0) - he is allergic to Sulfa drugs... he had routine colonoscopy by GI in [place] (DrMixon) 9/07 and it was neg... f/u planned 71yrs... I've reiterated my rec for  ASA 81mg /d and MVI... ~  CXR 7/12 showed normal heart size, clear lungs, NAD... ~  EKG 7/12 showed NSR, WNL...  HYPERTENSION (ICD-401.9) - on AZOR 5-40 daily & tol well... BP today = 144/88 but he ran out of med several days ago... denies HA, fatigue, visual changes, CP, palipit, dizziness, syncope, dyspnea, edema, etc...   ~  5/10:  we decided to start therapy w/ combination product- AZOR 5/40 one tab daily... ~  12/10: he reports good BP control at home & reads 130/82 here today- tol Rx well, continue same. ~  7/11: remains stable on the AZOR Rx> 140/82 & similar at home. ~  7/12: stable on AZOR 5-40 per his home BP checks; BP 144/88 in office but ran out several days he said.  HYPERCHOLESTEROLEMIA (ICD-272.0) - on SIMVASTATIN 40mg /d... he is allergic to Lipitor w/ rash... ~  FLP 1/08 showed TChol 245, TG 66, HDL 57, LDL 173... on diet alone, started Zocor 20/d... ~  FLP 3/09 showed TChol 163, TG 96, HDL 56, LDL 88... rec> continue Simva20. ~  FLP 5/10 on diet alone showed TChol 199, TG 63, HDL 51, LDL 136... rec restart SIMVA20. ~  FLP 12/10 showed TChol 171, TG 41, HDL 53, LDL 110... continue Simva20 + diet. ~  FLP 7/11 on Simva20 showed TChol 194, TG 86, HDL 58, LDL 119.Marland KitchenMarland Kitchen  he wants to get LDL<100, rec incr Simva40. ~  FLP 7/12 on Simva40 showed TChol 166, TG 66, HDL 56, LDL 96... Continue same + diet...  BENIGN PROSTATIC HYPERTROPHY, MILD, HX OF (ICD-V13.8) - eval by DrCarbon @ [facility] and DrDavis in [facility] urology w/ prev cysto... all symptoms resolved and denies current problems... 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 he gets occas outbreaks (?4-6/yr) and requests suppressive therapy- Rx Famvir 250mg Bid Prn.   No past surgical history on file.   Outpatient Encounter Prescriptions as of 03/23/2011  Medication Sig Dispense Refill  . amLODipine-olmesartan (AZOR) 5-40 MG per tablet Take 1 tablet by mouth daily.  30 tablet  11  .  famciclovir (FAMVIR) 250 MG tablet Take 1 tablet (250 mg total) by mouth 2 (two) times daily.  60 tablet  11  . simvastatin (ZOCOR) 40 MG tablet Take 1 tablet (40 mg total) by mouth at bedtime.  30 tablet  11    Allergies  Allergen Reactions  . Atorvastatin     REACTION: INTOL Lipitor w/ rash  . Sulfonamide Derivatives     REACTION: as a child    Current Medications, Allergies, Past Medical History, Past Surgical History, Family History, and Social History were reviewed in [employer] record.    Review of Systems  Respiratory: Positive for shortness of breath.        Constitutional:   No  weight loss, night sweats,  Fevers, chills, fatigue, or  lassitude.  HEENT:   No headaches,  Difficulty swallowing,  Tooth/dental problems, or  Sore throat,                No sneezing, itching, ear ache, nasal congestion, post nasal drip,   CV:  No chest pain,  Orthopnea, PND, swelling in lower extremities, anasarca, dizziness, palpitations, syncope.   GI  No  nausea, vomiting, diarrhea, change in bowel habits, loss of appetite, bloody stools.   Resp:   No excess mucus, no productive cough,  No non-productive cough,  No coughing up of blood.  No change in color of mucus.  No wheezing.  No chest wall deformity  Skin: no rash or lesions.  GU: no dysuria, change in color of urine, no urgency or frequency.  No flank pain, no hematuria   MS:  No joint pain or swelling.  No decreased range of motion.  No back pain.  Psych:  No change in mood or affect. No depression or anxiety.  No memory loss.       Objective:   Physical Exam       WD, WN, 62 y/o BM in NAD... GENERAL:  Alert & oriented; pleasant & cooperative... HEENT:  [place]/AT, , EACs-clear, TMs-wnl, NOSE-clear, THROAT-clear & wnl. NECK:  Supple w/ full ROM; no JVD; normal carotid impulses w/o bruits; no thyromegaly or nodules palpated; no lymphadenopathy. CHEST:  Clear to P & A; without wheezes/ rales/ or  rhonchi. HEART:  Regular Rhythm; without murmurs/ rubs/ or gallops. ABDOMEN:  Soft & nontender; normal bowel sounds; no organomegaly or masses detected.  EXT: without deformities or arthritic changes; no varicose veins/ venous insuffic/ or edema. NEURO:   gait normal & balance OK. DERM:  No lesions noted; no rash etc...  EKG with no acute changes   Assessment & Plan:

## 2011-11-20 NOTE — Patient Instructions (Signed)
Begin Dexilant 60mg daily until samples are gone then switch to Prilosec 20mg daily before meal .  GERD diet  follow up Dr. Nadel  4 weeks and As needed   Please contact office for sooner follow up if symptoms do not improve or worsen or seek emergency care    

## 2011-11-22 DIAGNOSIS — K219 Gastro-esophageal reflux disease without esophagitis: Secondary | ICD-10-CM | POA: Insufficient documentation

## 2011-11-22 NOTE — Assessment & Plan Note (Signed)
Begin Dexilant 60mg  daily until samples are gone then switch to Prilosec 20mg  daily before meal .  GERD diet  follow up Dr. Kriste Basque  4 weeks and As needed   Please contact office for sooner follow up if symptoms do not improve or worsen or seek emergency care

## 2011-11-30 ENCOUNTER — Telehealth: Payer: Self-pay | Admitting: Pulmonary Disease

## 2011-11-30 NOTE — Telephone Encounter (Signed)
Called and spoke with pt and he is aware of appt made for him to see SN on 4/26.  Pt voiced his understanding.

## 2011-12-21 ENCOUNTER — Ambulatory Visit: Payer: Managed Care, Other (non HMO) | Admitting: Pulmonary Disease

## 2011-12-26 ENCOUNTER — Other Ambulatory Visit: Payer: Self-pay | Admitting: Pulmonary Disease

## 2012-01-01 ENCOUNTER — Telehealth: Payer: Self-pay | Admitting: Pulmonary Disease

## 2012-01-01 NOTE — Telephone Encounter (Signed)
Received copies from [facility] 01/01/12 . Forwarded 3 pages to Dr.Nadel,for review.

## 2012-02-05 DIAGNOSIS — R002 Palpitations: Secondary | ICD-10-CM | POA: Insufficient documentation

## 2012-02-05 DIAGNOSIS — R0602 Shortness of breath: Secondary | ICD-10-CM | POA: Insufficient documentation

## 2012-02-05 DIAGNOSIS — R9431 Abnormal electrocardiogram [ECG] [EKG]: Secondary | ICD-10-CM | POA: Insufficient documentation

## 2012-02-05 DIAGNOSIS — R011 Cardiac murmur, unspecified: Secondary | ICD-10-CM | POA: Insufficient documentation

## 2012-03-12 DIAGNOSIS — I351 Nonrheumatic aortic (valve) insufficiency: Secondary | ICD-10-CM | POA: Insufficient documentation

## 2012-03-25 ENCOUNTER — Encounter: Payer: Managed Care, Other (non HMO) | Admitting: Pulmonary Disease

## 2012-03-26 ENCOUNTER — Encounter: Payer: Self-pay | Admitting: Pulmonary Disease

## 2012-04-03 ENCOUNTER — Other Ambulatory Visit: Payer: Self-pay | Admitting: Pulmonary Disease

## 2012-04-04 ENCOUNTER — Telehealth: Payer: Self-pay | Admitting: Pulmonary Disease

## 2012-04-04 NOTE — Telephone Encounter (Signed)
I spoke with pt and he stated rx was already sent and [facility] and made him aware of this. Nothing further was needed

## 2012-04-23 ENCOUNTER — Encounter: Payer: Managed Care, Other (non HMO) | Admitting: Pulmonary Disease

## 2012-05-15 ENCOUNTER — Encounter: Payer: Managed Care, Other (non HMO) | Admitting: Pulmonary Disease

## 2012-05-16 ENCOUNTER — Other Ambulatory Visit: Payer: Self-pay | Admitting: Pulmonary Disease

## 2012-05-16 MED ORDER — AMLODIPINE-OLMESARTAN 5-40 MG PO TABS: 1.0000 | ORAL_TABLET | Freq: Every day | ORAL | Status: DC

## 2012-05-16 NOTE — Telephone Encounter (Signed)
Pt has appt on 10 /18/2013 Refill x 1

## 2012-06-12 ENCOUNTER — Encounter: Payer: Self-pay | Admitting: *Deleted

## 2012-06-12 ENCOUNTER — Other Ambulatory Visit: Payer: Self-pay | Admitting: Pulmonary Disease

## 2012-06-13 ENCOUNTER — Other Ambulatory Visit (INDEPENDENT_AMBULATORY_CARE_PROVIDER_SITE_OTHER): Payer: Managed Care, Other (non HMO)

## 2012-06-13 ENCOUNTER — Encounter: Payer: Self-pay | Admitting: Pulmonary Disease

## 2012-06-13 ENCOUNTER — Ambulatory Visit (INDEPENDENT_AMBULATORY_CARE_PROVIDER_SITE_OTHER): Payer: Managed Care, Other (non HMO) | Admitting: Pulmonary Disease

## 2012-06-13 VITALS — BP 134/80 | HR 54 | Temp 97.3°F | Ht 66.0 in | Wt 167.8 lb

## 2012-06-13 DIAGNOSIS — Z87898 Personal history of other specified conditions: Secondary | ICD-10-CM

## 2012-06-13 DIAGNOSIS — E291 Testicular hypofunction: Secondary | ICD-10-CM

## 2012-06-13 DIAGNOSIS — E7800 Pure hypercholesterolemia, unspecified: Secondary | ICD-10-CM

## 2012-06-13 DIAGNOSIS — I10 Essential (primary) hypertension: Secondary | ICD-10-CM

## 2012-06-13 DIAGNOSIS — Z0000 Encounter for general adult medical examination without abnormal findings: Secondary | ICD-10-CM

## 2012-06-13 LAB — HEPATIC FUNCTION PANEL
Albumin: 3.8 g/dL (ref 3.5–5.2)
Alkaline Phosphatase: 48 U/L (ref 39–117)
Total Bilirubin: 0.9 mg/dL (ref 0.3–1.2)

## 2012-06-13 LAB — CBC WITH DIFFERENTIAL/PLATELET
Eosinophils Relative: 1 % (ref 0.0–5.0)
HCT: 41.6 % (ref 39.0–52.0)
Hemoglobin: 13.5 g/dL (ref 13.0–17.0)
Lymphs Abs: 2 10*3/uL (ref 0.7–4.0)
Monocytes Relative: 12.8 % — ABNORMAL HIGH (ref 3.0–12.0)
Neutro Abs: 2.1 10*3/uL (ref 1.4–7.7)
WBC: 4.8 10*3/uL (ref 4.5–10.5)

## 2012-06-13 LAB — BASIC METABOLIC PANEL
Calcium: 9.3 mg/dL (ref 8.4–10.5)
GFR: 87.3 mL/min (ref >60.00)
Glucose, Bld: 84 mg/dL (ref 70–99)
Sodium: 140 mEq/L (ref 135–145)

## 2012-06-13 LAB — LIPID PANEL
HDL: 62.8 mg/dL (ref >39.00)
LDL Cholesterol: 104 mg/dL — ABNORMAL HIGH (ref 0–99)
VLDL: 14 mg/dL (ref 0.0–40.0)

## 2012-06-13 MED ORDER — SIMVASTATIN 40 MG PO TABS: 40.0000 mg | ORAL_TABLET | Freq: Every day | ORAL | Status: DC

## 2012-06-13 MED ORDER — TADALAFIL 20 MG PO TABS: ORAL_TABLET | ORAL | Status: DC

## 2012-06-13 MED ORDER — FAMCICLOVIR 250 MG PO TABS: 250.0000 mg | ORAL_TABLET | Freq: Two times a day (BID) | ORAL | Status: DC | PRN

## 2012-06-13 MED ORDER — AMLODIPINE-OLMESARTAN 5-40 MG PO TABS: 1.0000 | ORAL_TABLET | Freq: Every day | ORAL | Status: DC

## 2012-06-13 NOTE — Patient Instructions (Addendum)
Today we updated your med list in our EPIC system...    Continue your current medications the same...  Today we did your FASTING blood work...    We will communicate the results to you when avail...  Call for any questions or if we can be of assistance in any way.Marland KitchenMarland Kitchen

## 2012-06-14 ENCOUNTER — Encounter: Payer: Self-pay | Admitting: Pulmonary Disease

## 2012-06-14 NOTE — Progress Notes (Addendum)
Subjective:    Patient ID: Jared Roth, male    DOB: 08/07/1951, 61 y.o.   MRN: [id number]  HPI 61 y/o BM here for a follow up visit.. he has multiple medical problems as noted below... Followed for general medical purposes w/ hx of HBP, Hyperchol, BPH & hx HSVII.Marland Kitchen.  ~  August 09, 2009:  he has been taking the AZOR regularly & tol well- BP's at home have been 120-130's/ 70-80's... due for f/u FLP...  ~  March 06, 2010:  he noted some self-limited upper back pain several months ago- now resolved w/ heat & Ibuprofen... BP remains controlled on AZOR, but LDL is still sl elevated on Simva20 so we decided to incr to Simva40... no other complaints or concerns...  ~  March 23, 2011:  Yearly ROV & CPX> he notes some intermittent heartburn that he relates to emotional upset & it affects his breathing, no CP/ palpit/ etc; occurs 1-2 per week, lasts a few seconds, & relief is spont; we discussed further eval if it increasing in freq or severity, for now try Prilosec OTC before breakfast...  BP appears well controlled on AZOR;  Chol well managed on the Simva40;  The only issue is med compliance> not taking them every day & he is encouraged to take regularly...  ~  June 13, 2012:  77mo ROV & CPX> Jared Roth reports that he has seen a number of physicians this past yr>>    Cardiology>  He reports being referred for a treadmill by his GI before they would do EGD; he tells me that his EKG was OK & the treadmill was neg...    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    Derm>  He had a plantar wart removed recently...    We reviewed prob list, meds, xrays and labs> see below for updates >>  LABS 10/13:  FLP- at goals on Simva40 x LDL=104;  Chems- wnl;  CBC- wnl;  TSH=0.55         Problem List:   PHYSICAL EXAMINATION (ICD-V70.0) - he is allergic to Sulfa drugs... he had routine colonoscopy by GI in [place] (DrMixon) 9/07 and it was neg... f/u planned 13yrs... I've reiterated my rec for ASA 81mg /d and MVI... ~  CXR 7/12 showed normal heart size, clear lungs, NAD... ~  EKG 7/12 showed NSR, WNL... ~  2013:  He reports CXR & EKG from his physicians in [place] at [facility] (we do not have copies)...  HYPERTENSION (ICD-401.9) - on AZOR 5-40 daily & tol well...  ~  5/10:  we decided to start therapy w/ combination product- AZOR 5/40 one tab daily... ~  12/10: he reports good BP control at home & reads 130/82 here today- tol Rx well, continue same. ~  7/11: remains stable on the AZOR Rx> 140/82 & similar at home. ~  7/12: stable on AZOR 5-40 per his home  BP checks; BP 144/88 in office but ran out several days he said. ~  10/13: BP= 134/80 & denies HA, fatigue, visual changes, CP, palipit, dizziness, syncope, dyspnea, edema, etc...    HYPERCHOLESTEROLEMIA (ICD-272.0) - on SIMVASTATIN 40mg /d... he is allergic to Lipitor w/ rash... ~  FLP 1/08 showed TChol 245, TG 66, HDL 57, LDL 173... on diet alone, started Zocor 20/d... ~  FLP 3/09 showed TChol 163, TG 96, HDL 56, LDL 88... rec> continue Simva20. ~  FLP 5/10 on diet alone showed TChol 199, TG 63, HDL 51, LDL 136... rec restart SIMVA20. ~  FLP 12/10 showed TChol 171, TG 41, HDL 53, LDL 110... continue Simva20 + diet. ~  FLP 7/11 on Simva20 showed TChol 194, TG 86, HDL 58, LDL 119... he wants to get LDL<100, rec incr Simva40. ~  FLP 7/12 on Simva40 showed TChol 166, TG 66, HDL 56, LDL 96... Continue same + diet... ~  FLP 10/13 on Simva40 showed TChol 181, TG  70, HDL 63, LDL 104  GI>  Eval in [place] for "throat spasms" in 2013; we don't have records but pt reports not much improvement after Prilosec Rx & then had EGD & Ba swallow which he reports were both neg/ WNL; he was tried on Dexilant & again- not much benefit; pt reports that all his symptoms resolved spontaneously ~Sep2013 & he notes "I think it was stress related, now I don't let things bother me"; he also notes new part time job now (retired from [employer] in 2009)... ~  He had neg colonoscopy from GI in [place] in 2007 w/ rec for recheck in 10 yrs...  MALE HYPOGONADISM >> BENIGN PROSTATIC HYPERTROPHY, MILD, HX OF (ICD-V13.8) - eval by DrCarbon @ [facility] and DrDavis in [facility] urology w/ prev cysto... all symptoms resolved and denies current problems... 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 he gets occas outbreaks (?4-6/yr) and requests suppressive therapy- Rx Famvir 250mg Bid Prn.   No past surgical history on file.   Outpatient Encounter Prescriptions as of 06/13/2012  Medication Sig Dispense Refill  . amLODipine-olmesartan (AZOR) 5-40 MG per tablet Take 1 tablet by mouth daily.  30 tablet  11  . famciclovir (FAMVIR) 250 MG tablet Take 1 tablet (250 mg total) by mouth 2 (two) times daily as needed.  60 tablet  11  . simvastatin (ZOCOR) 40 MG tablet Take 1 tablet (40 mg total) by mouth at bedtime.  30 tablet  11  . tadalafil (CIALIS) 20 MG tablet Take as directed  10 tablet  prn  . DISCONTD: amLODipine-olmesartan (AZOR) 5-40 MG per tablet Take 1 tablet by mouth daily.  30 tablet  1  . DISCONTD: famciclovir (FAMVIR) 250 MG tablet Take 1 tablet (250 mg total) by mouth 2 (two) times daily.  60 tablet  11  .  DISCONTD: famciclovir (FAMVIR) 250 MG tablet Take 250 mg by mouth 2 (two) times daily as needed.      Marland Kitchen DISCONTD: simvastatin (ZOCOR) 40 MG tablet Take 1 tablet (40 mg total) by mouth at bedtime.  30 tablet  11  . DISCONTD: tadalafil (CIALIS) 20 MG tablet Take as directed  10 tablet  prn  . DISCONTD: simvastatin (ZOCOR) 20 MG tablet TAKE 1 TABLET AT BEDTIME  30 tablet  0    Allergies  Allergen Reactions  . Atorvastatin     REACTION: INTOL Lipitor w/ rash  . Sulfonamide Derivatives     REACTION: as a child    Current Medications, Allergies, Past Medical History, Past Surgical History, Family History, and Social History were reviewed in [employer] record.    Review of Systems    The patient denies fever, chills, sweats, anorexia, fatigue, weakness, malaise, weight loss, sleep disorder, blurring, diplopia, eye irritation, eye discharge, vision loss, eye pain, photophobia, earache, ear discharge, tinnitus, decreased hearing, nasal congestion, nosebleeds, sore throat, hoarseness, chest pain, palpitations, syncope, dyspnea on exertion, orthopnea, PND, peripheral edema, cough, dyspnea at rest, excessive sputum, hemoptysis, wheezing, pleurisy, nausea, vomiting, diarrhea, constipation, change in bowel habits, abdominal pain, melena, hematochezia, jaundice, gas/bloating, indigestion/heartburn, dysphagia, odynophagia, dysuria, hematuria, urinary frequency, urinary hesitancy, nocturia, incontinence, back pain, joint pain, joint swelling, muscle cramps, muscle weakness, stiffness, arthritis, sciatica, restless legs, leg pain at night, leg pain with exertion, rash, itching, dryness, suspicious lesions, paralysis, paresthesias, seizures, tremors, vertigo, transient blindness, frequent falls, frequent headaches, difficulty walking, depression, anxiety, memory loss, confusion, cold intolerance, heat intolerance, polydipsia, polyphagia, polyuria, unusual weight change, abnormal bruising,  bleeding, enlarged lymph nodes, urticaria, allergic rash, hay fever, and recurrent infections.     Objective:   Physical Exam      WD, WN, 61 y/o BM in NAD... GENERAL:  Alert & oriented; pleasant & cooperative... HEENT:  [place]/AT, EOM-wnl, PERRLA, Fundi-benign, EACs-clear, TMs-wnl, NOSE-clear, THROAT-clear & wnl. NECK:  Supple w/ full ROM; no JVD; normal carotid impulses w/o bruits; no thyromegaly or nodules palpated; no lymphadenopathy. CHEST:  Clear to P & A; without wheezes/ rales/ or rhonchi. HEART:  Regular Rhythm; without murmurs/ rubs/ or gallops. ABDOMEN:  Soft & nontender; normal bowel sounds; no organomegaly or masses detected. (RECTAL:  Neg - prostate 3+ & nontender w/o nodules; stool hematest neg) EXT: without deformities or arthritic changes; no varicose veins/ venous insuffic/ or edema. NEURO:  CN's intact; motor testing normal; sensory testing normal; gait normal & balance OK. DERM:  No lesions noted; no rash etc...  RADIOLOGY DATA:  Reviewed in the EPIC EMR & discussed w/ the patient...  LABORATORY DATA:  Reviewed in the EPIC EMR & discussed w/ the patient...   Assessment & Plan:    CPX>  Good general health; not sure what to make of his symptom- very brief (just seconds), resolves spont, only 1-2/wk- so we decided to try OTC Prilosec & rec further eval if persistant or worsening symptoms...  HBP>  AZOR appears to be working well> rec taking it every day!   We discussed poss changing med if he wants to consider [facility] etc...  CHOL>  Simva40 working satis & again rec taking every day + diet & exercise discussed...  GU> Hypogonadism & ED>  He reports an interval check from Urology in [place] (we don't have notes); Dx w/ Low-T & he tried Axiron transiently & stopped on his own; I encouraged him to consider restart w/ f/u Testos & PSAs...   Patient's Medications  New Prescriptions    He was started on AXIRON by his Urologist in [place]...  Previous Medications   No  medications on file  Modified Medications   Modified Medication Previous Medication   AMLODIPINE-OLMESARTAN (AZOR) 5-40 MG PER TABLET amLODipine-olmesartan (AZOR) 5-40 MG per tablet  Take 1 tablet by mouth daily.    Take 1 tablet by mouth daily.   FAMCICLOVIR (FAMVIR) 250 MG TABLET famciclovir (FAMVIR) 250 MG tablet      Take 1 tablet (250 mg total) by mouth 2 (two) times daily as needed.    Take 1 tablet (250 mg total) by mouth 2 (two) times daily.   SIMVASTATIN (ZOCOR) 40 MG TABLET simvastatin (ZOCOR) 40 MG tablet      Take 1 tablet (40 mg total) by mouth at bedtime.    Take 1 tablet (40 mg total) by mouth at bedtime.   TADALAFIL (CIALIS) 20 MG TABLET tadalafil (CIALIS) 20 MG tablet      Take as directed    Take as directed  Discontinued Medications   FAMCICLOVIR (FAMVIR) 250 MG TABLET    Take 250 mg by mouth 2 (two) times daily as needed.   SIMVASTATIN (ZOCOR) 20 MG TABLET    TAKE 1 TABLET AT BEDTIME

## 2012-06-20 ENCOUNTER — Telehealth: Payer: Self-pay | Admitting: Pulmonary Disease

## 2012-06-20 NOTE — Progress Notes (Signed)
Quick Note:  Pt aware of lab results/recs per 10.25.13 phone note. ______

## 2012-06-20 NOTE — Telephone Encounter (Signed)
Per 10.18.13 lab results:  Notes Recorded by Michele Mcalpine, MD on 06/14/2012 at 9:54 AM Please notify patient>  FLP looks good on Simva40 + low chol diet... Chems/ CBC/ Thyroid are OK/ wnl...  Called spoke with patient, advised of lab results/recs as stated by SN above.  Pt verbalized his understanding and denied any questions.    Pt does report that his cialis 20mg  is too expensive and would like to try levitra if okay with SN.  Pt is aware SN is out of the office today and is okay with a call back on Monday when he returns.  Dr Kriste Basque please advise, thanks.  Allergies  Allergen Reactions  . Atorvastatin     REACTION: INTOL Lipitor w/ rash  . Sulfonamide Derivatives     REACTION: as a child

## 2012-06-23 MED ORDER — VARDENAFIL HCL 20 MG PO TABS: 20.0000 mg | ORAL_TABLET | Freq: Every day | ORAL | Status: DC | PRN

## 2012-06-23 NOTE — Telephone Encounter (Signed)
Per SN---ok to change to levitra 20 mg  #10   Use as directed with 5 refills. thanks

## 2012-06-23 NOTE — Telephone Encounter (Signed)
Spoke with pt and notified of recs per SN Pt verbalized understanding and states nothing further needed Rx was sent to pharm 

## 2012-07-08 ENCOUNTER — Other Ambulatory Visit: Payer: Self-pay | Admitting: Pulmonary Disease

## 2012-09-23 ENCOUNTER — Telehealth: Payer: Self-pay | Admitting: Pulmonary Disease

## 2012-09-23 NOTE — Telephone Encounter (Signed)
The pt has been notified of the last results we have on record. He says that he just had another test done with his urologist in [place] and the level was 2.0. They are going to repeat this in a few weeks and he will have the labs sent to SN.

## 2012-09-23 NOTE — Telephone Encounter (Signed)
Last PSA was done on 03-23-11 and level was 1.04. Pre SN this was normal result.  [id number] to advise the pt. Carron Curie, CMA

## 2013-06-15 ENCOUNTER — Ambulatory Visit: Payer: Managed Care, Other (non HMO) | Admitting: Pulmonary Disease

## 2013-07-06 ENCOUNTER — Other Ambulatory Visit: Payer: Self-pay | Admitting: Pulmonary Disease

## 2013-08-19 ENCOUNTER — Other Ambulatory Visit: Payer: Self-pay | Admitting: Pulmonary Disease

## 2013-09-03 ENCOUNTER — Telehealth: Payer: Self-pay | Admitting: Pulmonary Disease

## 2013-09-03 NOTE — Telephone Encounter (Signed)
I spoke with the pt to verify what medication he is asking for in place of azor. He is currently working and does not have the name with him. He will call back with alternative that was given to him by insurance company. Will await call back.Jared CurieJennifer Sabien Umland, CMA

## 2013-09-03 NOTE — Telephone Encounter (Signed)
Called and spoke with pt and he stated that the azor has gone up on the price this year and pt is requesting to have a cheaper alternative.  He stated that etena told him of a generic that is in the same family as the azor that is covered and cheaper.  Pt stated that he will call back with the name of this medication

## 2013-09-03 NOTE — Telephone Encounter (Signed)
Patient calling back--hctz quinapr--to give generice for azor.

## 2013-09-04 NOTE — Telephone Encounter (Signed)
Per SN--ok to call in   Amlodipine 5 mg 1 daily Losartan 100 mg  1 daily   lmomtcb x 1

## 2013-09-07 MED ORDER — LOSARTAN POTASSIUM 100 MG PO TABS: 100.0000 mg | ORAL_TABLET | Freq: Every day | ORAL | Status: DC

## 2013-09-07 MED ORDER — AMLODIPINE BESYLATE 5 MG PO TABS
5.0000 mg | ORAL_TABLET | Freq: Every day | ORAL | Status: DC
Start: 2013-09-07 — End: 2013-12-07

## 2013-09-09 ENCOUNTER — Ambulatory Visit: Payer: Managed Care, Other (non HMO) | Admitting: Pulmonary Disease

## 2013-10-30 ENCOUNTER — Other Ambulatory Visit: Payer: Self-pay | Admitting: Pulmonary Disease

## 2013-10-30 DIAGNOSIS — I10 Essential (primary) hypertension: Secondary | ICD-10-CM

## 2013-10-30 DIAGNOSIS — E7800 Pure hypercholesterolemia, unspecified: Secondary | ICD-10-CM

## 2013-11-05 ENCOUNTER — Ambulatory Visit: Payer: Managed Care, Other (non HMO) | Admitting: Pulmonary Disease

## 2013-11-24 ENCOUNTER — Ambulatory Visit: Payer: Managed Care, Other (non HMO) | Admitting: Internal Medicine

## 2013-12-07 ENCOUNTER — Ambulatory Visit (INDEPENDENT_AMBULATORY_CARE_PROVIDER_SITE_OTHER): Payer: Managed Care, Other (non HMO) | Admitting: Internal Medicine

## 2013-12-07 ENCOUNTER — Encounter: Payer: Self-pay | Admitting: Internal Medicine

## 2013-12-07 VITALS — BP 158/100 | HR 80 | Temp 97.7°F | Resp 16 | Ht 66.0 in | Wt 175.0 lb

## 2013-12-07 DIAGNOSIS — R972 Elevated prostate specific antigen [PSA]: Secondary | ICD-10-CM | POA: Insufficient documentation

## 2013-12-07 DIAGNOSIS — Z23 Encounter for immunization: Secondary | ICD-10-CM

## 2013-12-07 DIAGNOSIS — Z0000 Encounter for general adult medical examination without abnormal findings: Secondary | ICD-10-CM

## 2013-12-07 DIAGNOSIS — B009 Herpesviral infection, unspecified: Secondary | ICD-10-CM

## 2013-12-07 DIAGNOSIS — N529 Male erectile dysfunction, unspecified: Secondary | ICD-10-CM | POA: Insufficient documentation

## 2013-12-07 DIAGNOSIS — I10 Essential (primary) hypertension: Secondary | ICD-10-CM

## 2013-12-07 DIAGNOSIS — E7800 Pure hypercholesterolemia, unspecified: Secondary | ICD-10-CM

## 2013-12-07 MED ORDER — VARDENAFIL HCL 20 MG PO TABS: 20.0000 mg | ORAL_TABLET | Freq: Every day | ORAL | Status: DC | PRN

## 2013-12-07 MED ORDER — AMLODIPINE-OLMESARTAN 5-40 MG PO TABS: ORAL_TABLET | ORAL | Status: DC

## 2013-12-07 MED ORDER — FAMCICLOVIR 250 MG PO TABS: 250.0000 mg | ORAL_TABLET | Freq: Three times a day (TID) | ORAL | Status: DC

## 2013-12-07 NOTE — Progress Notes (Signed)
Subjective:    Patient ID: Jared Roth, male    DOB: 09-07-1950, 63 y.o.   MRN: [id number]  HPI New pt - swiching from Dr Kriste BasqueNadel  The patient presents for a follow-up of  chronic hypertension, chronic dyslipidemia, ED controlled with medicines. BP is not well controlled on a new combo  BP Readings from Last 3 Encounters:  12/07/13 158/100  06/13/12 134/80  11/20/11 144/92   Wt Readings from Last 3 Encounters:  12/07/13 175 lb (79.379 kg)  06/13/12 167 lb 12.8 oz (76.114 kg)  11/20/11 178 lb 6.4 oz (80.922 kg)       Review of Systems  Constitutional: Negative for appetite change, fatigue and unexpected weight change.  HENT: Negative for congestion, nosebleeds, sneezing, sore throat and trouble swallowing.   Eyes: Negative for itching and visual disturbance.  Respiratory: Negative for cough.   Cardiovascular: Negative for chest pain, palpitations and leg swelling.  Gastrointestinal: Negative for nausea, diarrhea, blood in stool and abdominal distention.  Genitourinary: Negative for frequency and hematuria.  Musculoskeletal: Negative for back pain, gait problem, joint swelling and neck pain.  Skin: Negative for rash.  Neurological: Negative for dizziness, tremors, speech difficulty and weakness.  Psychiatric/Behavioral: Negative for sleep disturbance, dysphoric mood and agitation. The patient is not nervous/anxious.        Objective:   Physical Exam  Constitutional: He is oriented to person, place, and time. He appears well-developed and well-nourished. No distress.  HENT:  Head: Normocephalic and atraumatic.  Right Ear: External ear normal.  Left Ear: External ear normal.  Nose: Nose normal.  Mouth/Throat: Oropharynx is clear and moist. No oropharyngeal exudate.  Eyes: Conjunctivae and EOM are normal. Pupils are equal, round, and reactive to light. Right eye exhibits no discharge. Left eye exhibits no discharge. No scleral icterus.  Neck: Normal range of motion. Neck  supple. No JVD present. No tracheal deviation present. No thyromegaly present.  Cardiovascular: Normal rate, regular rhythm, normal heart sounds and intact distal pulses.  Exam reveals no gallop and no friction rub.   No murmur heard. Pulmonary/Chest: Effort normal and breath sounds normal. No stridor. No respiratory distress. He has no wheezes. He has no rales. He exhibits no tenderness.  Abdominal: Soft. Bowel sounds are normal. He exhibits no distension and no mass. There is no tenderness. There is no rebound and no guarding.  Musculoskeletal: Normal range of motion. He exhibits no edema and no tenderness.  Lymphadenopathy:    He has no cervical adenopathy.  Neurological: He is alert and oriented to person, place, and time. He has normal reflexes. No cranial nerve deficit. He exhibits normal muscle tone. Coordination normal.  Skin: Skin is warm and dry. No rash noted. He is not diaphoretic. No erythema. No pallor.  Psychiatric: He has a normal mood and affect. His behavior is normal. Judgment and thought content normal.  Looks very young  Lab Results  Component Value Date   WBC 4.8 06/13/2012   HGB 13.5 06/13/2012   HCT 41.6 06/13/2012   PLT 261.0 06/13/2012   GLUCOSE 84 06/13/2012   CHOL 181 06/13/2012   TRIG 70.0 06/13/2012   HDL 62.80 06/13/2012   LDLDIRECT 172.5 09/02/2006   LDLCALC 104* 06/13/2012   ALT 50 06/13/2012   AST 49* 06/13/2012   NA 140 06/13/2012   K 4.1 06/13/2012   CL 104 06/13/2012   CREATININE 0.9 06/13/2012   BUN 10 06/13/2012   CO2 30 06/13/2012   TSH 0.55 06/13/2012  PSA 1.04 03/23/2011         Assessment & Plan:

## 2013-12-07 NOTE — Assessment & Plan Note (Signed)
2014 Dr Williams CheHubbard did a prostate bx in 2014

## 2013-12-07 NOTE — Assessment & Plan Note (Signed)
Levitr prn

## 2013-12-07 NOTE — Assessment & Plan Note (Signed)
See Rx 

## 2013-12-07 NOTE — Progress Notes (Signed)
Pre visit review using our clinic review tool, if applicable. No additional management support is needed unless otherwise documented below in the visit note. 

## 2013-12-07 NOTE — Assessment & Plan Note (Signed)
Continue with current prescription therapy as reflected on the Med list.  

## 2013-12-08 ENCOUNTER — Telehealth: Payer: Self-pay | Admitting: Internal Medicine

## 2013-12-08 NOTE — Telephone Encounter (Signed)
Relevant patient education assigned to patient using Emmi. ° °

## 2014-06-08 ENCOUNTER — Encounter: Payer: Self-pay | Admitting: Internal Medicine

## 2014-06-08 ENCOUNTER — Other Ambulatory Visit (INDEPENDENT_AMBULATORY_CARE_PROVIDER_SITE_OTHER): Payer: Managed Care, Other (non HMO)

## 2014-06-08 ENCOUNTER — Ambulatory Visit (INDEPENDENT_AMBULATORY_CARE_PROVIDER_SITE_OTHER): Payer: Managed Care, Other (non HMO) | Admitting: Internal Medicine

## 2014-06-08 VITALS — BP 152/108 | HR 60 | Temp 98.4°F | Wt 172.0 lb

## 2014-06-08 DIAGNOSIS — N400 Enlarged prostate without lower urinary tract symptoms: Secondary | ICD-10-CM

## 2014-06-08 DIAGNOSIS — Z0000 Encounter for general adult medical examination without abnormal findings: Secondary | ICD-10-CM

## 2014-06-08 DIAGNOSIS — Z23 Encounter for immunization: Secondary | ICD-10-CM

## 2014-06-08 LAB — CBC WITH DIFFERENTIAL/PLATELET
BASOS PCT: 0.3 % (ref 0.0–3.0)
Basophils Absolute: 0 10*3/uL (ref 0.0–0.1)
EOS PCT: 0.9 % (ref 0.0–5.0)
Eosinophils Absolute: 0 10*3/uL (ref 0.0–0.7)
HCT: 40.7 % (ref 39.0–52.0)
HEMOGLOBIN: 13.5 g/dL (ref 13.0–17.0)
LYMPHS PCT: 31.6 % (ref 12.0–46.0)
Lymphs Abs: 1.5 10*3/uL (ref 0.7–4.0)
MCHC: 33.3 g/dL (ref 30.0–36.0)
MCV: 92.6 fl (ref 78.0–100.0)
Monocytes Absolute: 0.5 10*3/uL (ref 0.1–1.0)
Monocytes Relative: 9.9 % (ref 3.0–12.0)
NEUTROS ABS: 2.8 10*3/uL (ref 1.4–7.7)
Neutrophils Relative %: 57.3 % (ref 43.0–77.0)
Platelets: 277 10*3/uL (ref 150.0–400.0)
RBC: 4.39 Mil/uL (ref 4.22–5.81)
RDW: 12.6 % (ref 11.5–15.5)
WBC: 4.9 10*3/uL (ref 4.0–10.5)

## 2014-06-08 LAB — URINALYSIS
BILIRUBIN URINE: NEGATIVE
Hgb urine dipstick: NEGATIVE
KETONES UR: NEGATIVE
Leukocytes, UA: NEGATIVE
NITRITE: NEGATIVE
PH: 6 (ref 5.0–8.0)
Specific Gravity, Urine: 1.025 (ref 1.000–1.030)
Total Protein, Urine: NEGATIVE
Urine Glucose: NEGATIVE
Urobilinogen, UA: 0.2 (ref 0.0–1.0)

## 2014-06-08 LAB — BASIC METABOLIC PANEL
BUN: 11 mg/dL (ref 6–23)
CHLORIDE: 104 meq/L (ref 96–112)
CO2: 28 mEq/L (ref 19–32)
CREATININE: 1.1 mg/dL (ref 0.4–1.5)
Calcium: 9.5 mg/dL (ref 8.4–10.5)
GFR: 69.41 mL/min (ref >60.00)
Glucose, Bld: 98 mg/dL (ref 70–99)
Potassium: 3.9 mEq/L (ref 3.5–5.1)
SODIUM: 140 meq/L (ref 135–145)

## 2014-06-08 LAB — HEPATIC FUNCTION PANEL
ALT: 22 U/L (ref 0–53)
AST: 28 U/L (ref 0–37)
Albumin: 3.7 g/dL (ref 3.5–5.2)
Alkaline Phosphatase: 56 U/L (ref 39–117)
Bilirubin, Direct: 0.1 mg/dL (ref 0.0–0.3)
Total Bilirubin: 0.8 mg/dL (ref 0.2–1.2)
Total Protein: 8.2 g/dL (ref 6.0–8.3)

## 2014-06-08 LAB — LIPID PANEL
CHOL/HDL RATIO: 3
Cholesterol: 173 mg/dL (ref 0–200)
HDL: 50.9 mg/dL (ref >39.00)
LDL Cholesterol: 105 mg/dL — ABNORMAL HIGH (ref 0–99)
NONHDL: 122.1
Triglycerides: 86 mg/dL (ref 0.0–149.0)
VLDL: 17.2 mg/dL (ref 0.0–40.0)

## 2014-06-08 LAB — PSA: PSA: 1.83 ng/mL (ref 0.10–4.00)

## 2014-06-08 LAB — TSH: TSH: 0.68 u[IU]/mL (ref 0.35–4.50)

## 2014-06-08 MED ORDER — AMLODIPINE-OLMESARTAN 5-40 MG PO TABS: ORAL_TABLET | ORAL | Status: DC

## 2014-06-08 MED ORDER — TADALAFIL 20 MG PO TABS: 20.0000 mg | ORAL_TABLET | Freq: Every day | ORAL | Status: DC | PRN

## 2014-06-08 MED ORDER — TADALAFIL 5 MG PO TABS: 5.0000 mg | ORAL_TABLET | Freq: Every day | ORAL | Status: DC | PRN

## 2014-06-08 NOTE — Assessment & Plan Note (Signed)
Cialis 5 mg/d  °

## 2014-06-08 NOTE — Progress Notes (Signed)
   Subjective:    HPI   The patient presents for a follow-up of  chronic hypertension, chronic dyslipidemia, ED controlled with medicines. BP is not well controlled on a new combo  BP Readings from Last 3 Encounters:  06/08/14 152/108  12/07/13 158/100  06/13/12 134/80   Wt Readings from Last 3 Encounters:  06/08/14 172 lb (78.019 kg)  12/07/13 175 lb (79.379 kg)  06/13/12 167 lb 12.8 oz (76.114 kg)       Review of Systems  Constitutional: Negative for appetite change, fatigue and unexpected weight change.  HENT: Negative for congestion, nosebleeds, sneezing, sore throat and trouble swallowing.   Eyes: Negative for itching and visual disturbance.  Respiratory: Negative for cough.   Cardiovascular: Negative for chest pain, palpitations and leg swelling.  Gastrointestinal: Negative for nausea, diarrhea, blood in stool and abdominal distention.  Genitourinary: Negative for frequency and hematuria.  Musculoskeletal: Negative for back pain, gait problem, joint swelling and neck pain.  Skin: Negative for rash.  Neurological: Negative for dizziness, tremors, speech difficulty and weakness.  Psychiatric/Behavioral: Negative for sleep disturbance, dysphoric mood and agitation. The patient is not nervous/anxious.        Objective:   Physical Exam  Constitutional: He is oriented to person, place, and time. He appears well-developed and well-nourished. No distress.  HENT:  Head: Normocephalic and atraumatic.  Right Ear: External ear normal.  Left Ear: External ear normal.  Nose: Nose normal.  Mouth/Throat: Oropharynx is clear and moist. No oropharyngeal exudate.  Eyes: Conjunctivae and EOM are normal. Pupils are equal, round, and reactive to light. Right eye exhibits no discharge. Left eye exhibits no discharge. No scleral icterus.  Neck: Normal range of motion. Neck supple. No JVD present. No tracheal deviation present. No thyromegaly present.  Cardiovascular: Normal rate,  regular rhythm, normal heart sounds and intact distal pulses.  Exam reveals no gallop and no friction rub.   No murmur heard. Pulmonary/Chest: Effort normal and breath sounds normal. No stridor. No respiratory distress. He has no wheezes. He has no rales. He exhibits no tenderness.  Abdominal: Soft. Bowel sounds are normal. He exhibits no distension and no mass. There is no tenderness. There is no rebound and no guarding.  Musculoskeletal: Normal range of motion. He exhibits no edema and no tenderness.  Lymphadenopathy:    He has no cervical adenopathy.  Neurological: He is alert and oriented to person, place, and time. He has normal reflexes. No cranial nerve deficit. He exhibits normal muscle tone. Coordination normal.  Skin: Skin is warm and dry. No rash noted. He is not diaphoretic. No erythema. No pallor.  Psychiatric: He has a normal mood and affect. His behavior is normal. Judgment and thought content normal.  Looks very young  Lab Results  Component Value Date   WBC 4.8 06/13/2012   HGB 13.5 06/13/2012   HCT 41.6 06/13/2012   PLT 261.0 06/13/2012   GLUCOSE 84 06/13/2012   CHOL 181 06/13/2012   TRIG 70.0 06/13/2012   HDL 62.80 06/13/2012   LDLDIRECT 172.5 09/02/2006   LDLCALC 104* 06/13/2012   ALT 50 06/13/2012   AST 49* 06/13/2012   NA 140 06/13/2012   K 4.1 06/13/2012   CL 104 06/13/2012   CREATININE 0.9 06/13/2012   BUN 10 06/13/2012   CO2 30 06/13/2012   TSH 0.55 06/13/2012   PSA 1.04 03/23/2011         Assessment & Plan:

## 2014-06-08 NOTE — Progress Notes (Signed)
Pre visit review using our clinic review tool, if applicable. No additional management support is needed unless otherwise documented below in the visit note. 

## 2014-06-11 ENCOUNTER — Other Ambulatory Visit: Payer: Self-pay

## 2014-07-05 ENCOUNTER — Other Ambulatory Visit: Payer: Self-pay | Admitting: Pulmonary Disease

## 2014-07-07 ENCOUNTER — Other Ambulatory Visit: Payer: Self-pay | Admitting: Internal Medicine

## 2014-12-08 ENCOUNTER — Encounter: Payer: Self-pay | Admitting: Internal Medicine

## 2014-12-08 ENCOUNTER — Other Ambulatory Visit (INDEPENDENT_AMBULATORY_CARE_PROVIDER_SITE_OTHER): Payer: BLUE CROSS/BLUE SHIELD

## 2014-12-08 ENCOUNTER — Ambulatory Visit (INDEPENDENT_AMBULATORY_CARE_PROVIDER_SITE_OTHER): Payer: BLUE CROSS/BLUE SHIELD | Admitting: Internal Medicine

## 2014-12-08 VITALS — BP 139/80 | HR 58 | Ht 66.0 in | Wt 177.0 lb

## 2014-12-08 DIAGNOSIS — Z0000 Encounter for general adult medical examination without abnormal findings: Secondary | ICD-10-CM | POA: Diagnosis not present

## 2014-12-08 LAB — BASIC METABOLIC PANEL
BUN: 13 mg/dL (ref 6–23)
CO2: 29 meq/L (ref 19–32)
Calcium: 9.7 mg/dL (ref 8.4–10.5)
Chloride: 103 mEq/L (ref 96–112)
Creatinine, Ser: 1.05 mg/dL (ref 0.40–1.50)
GFR: 76.2 mL/min (ref >60.00)
Glucose, Bld: 104 mg/dL — ABNORMAL HIGH (ref 70–99)
Potassium: 4.3 mEq/L (ref 3.5–5.1)
SODIUM: 139 meq/L (ref 135–145)

## 2014-12-08 LAB — CBC WITH DIFFERENTIAL/PLATELET
BASOS PCT: 1.1 % (ref 0.0–3.0)
Basophils Absolute: 0 10*3/uL (ref 0.0–0.1)
EOS ABS: 0.1 10*3/uL (ref 0.0–0.7)
Eosinophils Relative: 1.8 % (ref 0.0–5.0)
HCT: 39 % (ref 39.0–52.0)
HEMOGLOBIN: 13.4 g/dL (ref 13.0–17.0)
Lymphocytes Relative: 39.7 % (ref 12.0–46.0)
Lymphs Abs: 1.7 10*3/uL (ref 0.7–4.0)
MCHC: 34.3 g/dL (ref 30.0–36.0)
MCV: 91.2 fl (ref 78.0–100.0)
Monocytes Absolute: 0.5 10*3/uL (ref 0.1–1.0)
Monocytes Relative: 11.7 % (ref 3.0–12.0)
NEUTROS PCT: 45.7 % (ref 43.0–77.0)
Neutro Abs: 2 10*3/uL (ref 1.4–7.7)
Platelets: 267 10*3/uL (ref 150.0–400.0)
RBC: 4.28 Mil/uL (ref 4.22–5.81)
RDW: 13.6 % (ref 11.5–15.5)
WBC: 4.3 10*3/uL (ref 4.0–10.5)

## 2014-12-08 LAB — HEPATIC FUNCTION PANEL
ALK PHOS: 59 U/L (ref 39–117)
ALT: 20 U/L (ref 0–53)
AST: 22 U/L (ref 0–37)
Albumin: 4.2 g/dL (ref 3.5–5.2)
Bilirubin, Direct: 0.1 mg/dL (ref 0.0–0.3)
Total Bilirubin: 0.7 mg/dL (ref 0.2–1.2)
Total Protein: 7.6 g/dL (ref 6.0–8.3)

## 2014-12-08 LAB — URINALYSIS
Bilirubin Urine: NEGATIVE
Hgb urine dipstick: NEGATIVE
KETONES UR: NEGATIVE
LEUKOCYTES UA: NEGATIVE
Nitrite: NEGATIVE
PH: 6.5 (ref 5.0–8.0)
SPECIFIC GRAVITY, URINE: 1.025 (ref 1.000–1.030)
TOTAL PROTEIN, URINE-UPE24: NEGATIVE
URINE GLUCOSE: NEGATIVE
Urobilinogen, UA: 0.2 (ref 0.0–1.0)

## 2014-12-08 LAB — TSH: TSH: 1.08 u[IU]/mL (ref 0.35–4.50)

## 2014-12-08 LAB — LIPID PANEL
CHOLESTEROL: 177 mg/dL (ref 0–200)
HDL: 56.4 mg/dL (ref >39.00)
LDL CALC: 99 mg/dL (ref 0–99)
NonHDL: 120.6
TRIGLYCERIDES: 106 mg/dL (ref 0.0–149.0)
Total CHOL/HDL Ratio: 3
VLDL: 21.2 mg/dL (ref 0.0–40.0)

## 2014-12-08 LAB — PSA: PSA: 1.74 ng/mL (ref 0.10–4.00)

## 2014-12-08 MED ORDER — VITAMIN D 1000 UNITS PO TABS: 1000.0000 [IU] | ORAL_TABLET | Freq: Every day | ORAL | Status: AC

## 2014-12-08 NOTE — Progress Notes (Signed)
Subjective:    HPI  The patient is here for a wellness exam.  The patient presents for a follow-up of  chronic hypertension, chronic dyslipidemia, ED controlled with medicines. BP is not well controlled on a new combo  BP Readings from Last 3 Encounters:  12/08/14 139/80  06/08/14 152/108  12/07/13 158/100   Wt Readings from Last 3 Encounters:  12/08/14 177 lb (80.287 kg)  06/08/14 172 lb (78.019 kg)  12/07/13 175 lb (79.379 kg)       Review of Systems  Constitutional: Negative for appetite change, fatigue and unexpected weight change.  HENT: Negative for congestion, nosebleeds, sneezing, sore throat, trouble swallowing and voice change.   Eyes: Negative for itching and visual disturbance.  Respiratory: Negative for cough.   Cardiovascular: Negative for chest pain, palpitations and leg swelling.  Gastrointestinal: Negative for nausea, diarrhea, blood in stool and abdominal distention.  Genitourinary: Negative for dysuria, frequency and hematuria.  Musculoskeletal: Negative for back pain, joint swelling, gait problem and neck pain.  Skin: Negative for rash.  Neurological: Negative for dizziness, tremors, speech difficulty and weakness.  Psychiatric/Behavioral: Negative for suicidal ideas, sleep disturbance, dysphoric mood and agitation. The patient is not nervous/anxious.        Objective:   Physical Exam  Constitutional: He is oriented to person, place, and time. He appears well-developed and well-nourished. No distress.  HENT:  Head: Normocephalic and atraumatic.  Right Ear: External ear normal.  Left Ear: External ear normal.  Nose: Nose normal.  Mouth/Throat: Oropharynx is clear and moist. No oropharyngeal exudate.  Eyes: Conjunctivae and EOM are normal. Pupils are equal, round, and reactive to light. Right eye exhibits no discharge. Left eye exhibits no discharge. No scleral icterus.  Neck: Normal range of motion. Neck supple. No JVD present. No tracheal  deviation present. No thyromegaly present.  Cardiovascular: Normal rate, regular rhythm, normal heart sounds and intact distal pulses.  Exam reveals no gallop and no friction rub.   No murmur heard. Pulmonary/Chest: Effort normal and breath sounds normal. No stridor. No respiratory distress. He has no wheezes. He has no rales. He exhibits no tenderness.  Abdominal: Soft. Bowel sounds are normal. He exhibits no distension and no mass. There is no tenderness. There is no rebound and no guarding.  Genitourinary: Penis normal. Guaiac negative stool.  Prostate 1+  Musculoskeletal: Normal range of motion. He exhibits no edema or tenderness.  Lymphadenopathy:    He has no cervical adenopathy.  Neurological: He is alert and oriented to person, place, and time. He has normal reflexes. No cranial nerve deficit. He exhibits normal muscle tone. Coordination normal.  Skin: Skin is warm and dry. No rash noted. He is not diaphoretic. No erythema. No pallor.  Psychiatric: He has a normal mood and affect. His behavior is normal. Judgment and thought content normal.  Looks very young  Lab Results  Component Value Date   WBC 4.9 06/08/2014   HGB 13.5 06/08/2014   HCT 40.7 06/08/2014   PLT 277.0 06/08/2014   GLUCOSE 98 06/08/2014   CHOL 173 06/08/2014   TRIG 86.0 06/08/2014   HDL 50.90 06/08/2014   LDLDIRECT 172.5 09/02/2006   LDLCALC 105* 06/08/2014   ALT 22 06/08/2014   AST 28 06/08/2014   NA 140 06/08/2014   K 3.9 06/08/2014   CL 104 06/08/2014   CREATININE 1.1 06/08/2014   BUN 11 06/08/2014   CO2 28 06/08/2014   TSH 0.68 06/08/2014   PSA 1.83 06/08/2014  Assessment & Plan:

## 2014-12-08 NOTE — Assessment & Plan Note (Addendum)
We discussed age appropriate health related issues, including available/recomended screening tests and vaccinations. We discussed a need for adhering to healthy diet and exercise. Labs/EKG were reviewed/ordered. All questions were answered. Labs Zostavax discussed Colonoscopy

## 2014-12-08 NOTE — Progress Notes (Signed)
Pre visit review using our clinic review tool, if applicable. No additional management support is needed unless otherwise documented below in the visit note. 

## 2015-01-26 ENCOUNTER — Telehealth: Payer: Self-pay | Admitting: Internal Medicine

## 2015-01-26 NOTE — Telephone Encounter (Signed)
Patient is requesting a generic of Azor to be [facility] to [facility] [place] [address].  Patient states this med has gotten too expensive for him.

## 2015-01-28 NOTE — Telephone Encounter (Signed)
OK. Thx

## 2015-01-28 NOTE — Telephone Encounter (Signed)
Please advise what generic med can be sent in place of Azor? It is too expensive.

## 2015-01-31 MED ORDER — LOSARTAN POTASSIUM 100 MG PO TABS: 100.0000 mg | ORAL_TABLET | Freq: Every day | ORAL | Status: DC

## 2015-01-31 MED ORDER — AMLODIPINE BESYLATE 5 MG PO TABS: 5.0000 mg | ORAL_TABLET | Freq: Every day | ORAL | Status: DC

## 2015-01-31 NOTE — Telephone Encounter (Signed)
OK Losartan+amlodipine instead - emailed Thx

## 2015-01-31 NOTE — Telephone Encounter (Signed)
Pt informed

## 2015-03-18 ENCOUNTER — Other Ambulatory Visit: Payer: Self-pay | Admitting: Internal Medicine

## 2015-03-29 ENCOUNTER — Other Ambulatory Visit: Payer: Self-pay | Admitting: Internal Medicine

## 2015-05-13 ENCOUNTER — Other Ambulatory Visit: Payer: Self-pay | Admitting: *Deleted

## 2015-05-13 MED ORDER — SIMVASTATIN 40 MG PO TABS: 40.0000 mg | ORAL_TABLET | Freq: Every day | ORAL | Status: DC

## 2015-07-11 ENCOUNTER — Other Ambulatory Visit: Payer: Self-pay | Admitting: *Deleted

## 2015-07-11 MED ORDER — OLMESARTAN MEDOXOMIL 40 MG PO TABS: 40.0000 mg | ORAL_TABLET | Freq: Every day | ORAL | Status: DC

## 2015-07-11 NOTE — Telephone Encounter (Signed)
Ok Benicar 40 mg, amlodipine 5 mg daily  Sch OV in 2-4 weeks Thx

## 2015-07-11 NOTE — Telephone Encounter (Signed)
Receive call pt states pharmacist told him that the Losartan is not equivalent to Azor. Benicar is what is equivalent, and to have md send new rx in for Benicar. Pt states he check his BP it was 180 something over 90. Requesting rx for Benicar...Raechel Chute/lmb

## 2015-07-11 NOTE — Telephone Encounter (Signed)
Notified pt with md response. [facility] made appt for 12/6...Raechel Chute/lmb

## 2015-07-27 ENCOUNTER — Other Ambulatory Visit: Payer: Self-pay | Admitting: Pulmonary Disease

## 2015-08-02 ENCOUNTER — Ambulatory Visit (INDEPENDENT_AMBULATORY_CARE_PROVIDER_SITE_OTHER): Payer: BLUE CROSS/BLUE SHIELD | Admitting: Internal Medicine

## 2015-08-02 ENCOUNTER — Encounter: Payer: Self-pay | Admitting: Internal Medicine

## 2015-08-02 VITALS — BP 136/82 | HR 78 | Temp 98.6°F | Wt 178.0 lb

## 2015-08-02 DIAGNOSIS — I10 Essential (primary) hypertension: Secondary | ICD-10-CM

## 2015-08-02 DIAGNOSIS — Z23 Encounter for immunization: Secondary | ICD-10-CM

## 2015-08-02 DIAGNOSIS — Z0000 Encounter for general adult medical examination without abnormal findings: Secondary | ICD-10-CM | POA: Diagnosis not present

## 2015-08-02 MED ORDER — FAMCICLOVIR 250 MG PO TABS: 250.0000 mg | ORAL_TABLET | Freq: Three times a day (TID) | ORAL | Status: DC

## 2015-08-02 NOTE — Assessment & Plan Note (Signed)
Chronic  Benicar, Norvasc

## 2015-08-02 NOTE — Progress Notes (Signed)
Pre visit review using our clinic review tool, if applicable. No additional management support is needed unless otherwise documented below in the visit note. 

## 2015-08-02 NOTE — Progress Notes (Signed)
Subjective:  Patient ID: Jared Roth, male    DOB: [birth year]  Age: 64 y.o. MRN: [id number]  CC: No chief complaint on file.   HPI Jared Roth presents for HTN  Outpatient Prescriptions Prior to Visit  Medication Sig Dispense Refill  . amLODipine (NORVASC) 5 MG tablet Take 1 tablet (5 mg total) by mouth daily. 90 tablet 3  . cholecalciferol (VITAMIN D) 1000 UNITS tablet Take 1 tablet (1,000 Units total) by mouth daily. 100 tablet 3  . famciclovir (FAMVIR) 250 MG tablet Take 1 tablet (250 mg total) by mouth 3 (three) times daily. 21 tablet 5  . olmesartan (BENICAR) 40 MG tablet Take 1 tablet (40 mg total) by mouth daily. D/C LOSARTAN 30 tablet 11  . sildenafil (REVATIO) 20 MG tablet  1-5 po as directed    . simvastatin (ZOCOR) 40 MG tablet Take 1 tablet (40 mg total) by mouth at bedtime. 90 tablet 2  . tadalafil (CIALIS) 5 MG tablet Take 1 tablet (5 mg total) by mouth daily as needed for erectile dysfunction. 90 tablet 3   No facility-administered medications prior to visit.    ROS Review of Systems  Constitutional: Negative for appetite change, fatigue and unexpected weight change.  HENT: Negative for congestion, nosebleeds, sneezing, sore throat and trouble swallowing.   Eyes: Negative for itching and visual disturbance.  Respiratory: Negative for cough.   Cardiovascular: Negative for chest pain, palpitations and leg swelling.  Gastrointestinal: Negative for nausea, diarrhea, blood in stool and abdominal distention.  Genitourinary: Negative for frequency and hematuria.  Musculoskeletal: Negative for back pain, joint swelling, gait problem and neck pain.  Skin: Negative for rash.  Neurological: Negative for dizziness, tremors, speech difficulty and weakness.  Psychiatric/Behavioral: Negative for suicidal ideas, sleep disturbance, dysphoric mood and agitation. The patient is not nervous/anxious.     Objective:  BP 136/82 mmHg  Pulse 78  Temp(Src) 98.6 F (37 C) (Oral)   Wt 178 lb (80.74 kg)  SpO2 98%  BP Readings from Last 3 Encounters:  08/02/15 136/82  12/08/14 139/80  06/08/14 152/108    Wt Readings from Last 3 Encounters:  08/02/15 178 lb (80.74 kg)  12/08/14 177 lb (80.287 kg)  06/08/14 172 lb (78.019 kg)    Physical Exam  Constitutional: He is oriented to person, place, and time. He appears well-developed. No distress.  NAD  HENT:  Mouth/Throat: Oropharynx is clear and moist.  Eyes: Conjunctivae are normal. Pupils are equal, round, and reactive to light.  Neck: Normal range of motion. No JVD present. No thyromegaly present.  Cardiovascular: Normal rate, regular rhythm, normal heart sounds and intact distal pulses.  Exam reveals no gallop and no friction rub.   No murmur heard. Pulmonary/Chest: Effort normal and breath sounds normal. No respiratory distress. He has no wheezes. He has no rales. He exhibits no tenderness.  Abdominal: Soft. Bowel sounds are normal. He exhibits no distension and no mass. There is no tenderness. There is no rebound and no guarding.  Musculoskeletal: Normal range of motion. He exhibits no edema or tenderness.  Lymphadenopathy:    He has no cervical adenopathy.  Neurological: He is alert and oriented to person, place, and time. He has normal reflexes. No cranial nerve deficit. He exhibits normal muscle tone. He displays a negative Romberg sign. Coordination and gait normal.  Skin: Skin is warm and dry. No rash noted.  Psychiatric: He has a normal mood and affect. His behavior is normal. Judgment and thought content  normal.    Lab Results  Component Value Date   WBC 4.3 12/08/2014   HGB 13.4 12/08/2014   HCT 39.0 12/08/2014   PLT 267.0 12/08/2014   GLUCOSE 104* 12/08/2014   CHOL 177 12/08/2014   TRIG 106.0 12/08/2014   HDL 56.40 12/08/2014   LDLDIRECT 172.5 09/02/2006   LDLCALC 99 12/08/2014   ALT 20 12/08/2014   AST 22 12/08/2014   NA 139 12/08/2014   K 4.3 12/08/2014   CL 103 12/08/2014    CREATININE 1.05 12/08/2014   BUN 13 12/08/2014   CO2 29 12/08/2014   TSH 1.08 12/08/2014   PSA 1.74 12/08/2014    Dg Chest 2 View  03/23/2011  *RADIOLOGY REPORT* Clinical Data: Physical exam.  Former smoker.  Hypertension. CHEST - 2 VIEW Comparison: 01/05/2009 Findings: Heart size and mediastinal contours are normal. No pleural effusion or pulmonary edema. There is no airspace consolidation identified. IMPRESSION: 1.  No active cardiopulmonary abnormalities. Normal chest. Original Report Authenticated By: Rosealee AlbeeAYLOR H. STROUD, M.D.   Assessment & Plan:   There are no diagnoses linked to this encounter. I am having [name] maintain his famciclovir, tadalafil, sildenafil, cholecalciferol, amLODipine, simvastatin, and olmesartan.  No orders of the defined types were placed in this encounter.     Follow-up: No Follow-up on file.  Sonda PrimesAlex Jakori Burkett, MD

## 2015-11-17 ENCOUNTER — Telehealth: Payer: Self-pay | Admitting: Internal Medicine

## 2015-11-17 MED ORDER — OLMESARTAN MEDOXOMIL 40 MG PO TABS: 40.0000 mg | ORAL_TABLET | Freq: Every day | ORAL | Status: DC

## 2015-11-17 NOTE — Telephone Encounter (Signed)
Pt states insurance has changed and he is requesting to have his olmesartan (BENICAR) 40 MG tablet [[id number] changed to 90 day supply. [facility] [place] told him it would be cheaper this way

## 2015-12-09 ENCOUNTER — Encounter: Payer: BLUE CROSS/BLUE SHIELD | Admitting: Internal Medicine

## 2015-12-16 ENCOUNTER — Ambulatory Visit (INDEPENDENT_AMBULATORY_CARE_PROVIDER_SITE_OTHER): Payer: BLUE CROSS/BLUE SHIELD | Admitting: Internal Medicine

## 2015-12-16 ENCOUNTER — Encounter: Payer: Self-pay | Admitting: Internal Medicine

## 2015-12-16 ENCOUNTER — Other Ambulatory Visit (INDEPENDENT_AMBULATORY_CARE_PROVIDER_SITE_OTHER): Payer: BLUE CROSS/BLUE SHIELD

## 2015-12-16 VITALS — BP 139/79 | HR 60 | Ht 66.0 in | Wt 178.0 lb

## 2015-12-16 DIAGNOSIS — Z0000 Encounter for general adult medical examination without abnormal findings: Secondary | ICD-10-CM

## 2015-12-16 DIAGNOSIS — E291 Testicular hypofunction: Secondary | ICD-10-CM

## 2015-12-16 DIAGNOSIS — N529 Male erectile dysfunction, unspecified: Secondary | ICD-10-CM

## 2015-12-16 DIAGNOSIS — I10 Essential (primary) hypertension: Secondary | ICD-10-CM

## 2015-12-16 DIAGNOSIS — E7800 Pure hypercholesterolemia, unspecified: Secondary | ICD-10-CM | POA: Diagnosis not present

## 2015-12-16 DIAGNOSIS — Z1211 Encounter for screening for malignant neoplasm of colon: Secondary | ICD-10-CM

## 2015-12-16 DIAGNOSIS — R7989 Other specified abnormal findings of blood chemistry: Secondary | ICD-10-CM

## 2015-12-16 LAB — CBC WITH DIFFERENTIAL/PLATELET
Basophils Absolute: 0.1 K/uL (ref 0.0–0.1)
Basophils Relative: 1.5 % (ref 0.0–3.0)
Eosinophils Absolute: 0.1 K/uL (ref 0.0–0.7)
Eosinophils Relative: 1.6 % (ref 0.0–5.0)
HCT: 40.3 % (ref 39.0–52.0)
Hemoglobin: 13.6 g/dL (ref 13.0–17.0)
Lymphocytes Relative: 33.9 % (ref 12.0–46.0)
Lymphs Abs: 1.8 K/uL (ref 0.7–4.0)
MCHC: 33.8 g/dL (ref 30.0–36.0)
MCV: 91.8 fl (ref 78.0–100.0)
Monocytes Absolute: 0.7 K/uL (ref 0.1–1.0)
Monocytes Relative: 12.4 % — ABNORMAL HIGH (ref 3.0–12.0)
Neutro Abs: 2.8 K/uL (ref 1.4–7.7)
Neutrophils Relative %: 50.6 % (ref 43.0–77.0)
Platelets: 271 K/uL (ref 150.0–400.0)
RBC: 4.39 Mil/uL (ref 4.22–5.81)
RDW: 13 % (ref 11.5–15.5)
WBC: 5.5 K/uL (ref 4.0–10.5)

## 2015-12-16 LAB — HEPATIC FUNCTION PANEL
ALT: 20 U/L (ref 0–53)
AST: 22 U/L (ref 0–37)
Albumin: 4.4 g/dL (ref 3.5–5.2)
Alkaline Phosphatase: 54 U/L (ref 39–117)
Bilirubin, Direct: 0.1 mg/dL (ref 0.0–0.3)
Total Bilirubin: 0.7 mg/dL (ref 0.2–1.2)
Total Protein: 7.7 g/dL (ref 6.0–8.3)

## 2015-12-16 LAB — URINALYSIS
BILIRUBIN URINE: NEGATIVE
Hgb urine dipstick: NEGATIVE
KETONES UR: NEGATIVE
Leukocytes, UA: NEGATIVE
Nitrite: NEGATIVE
SPECIFIC GRAVITY, URINE: 1.015 (ref 1.000–1.030)
Total Protein, Urine: NEGATIVE
URINE GLUCOSE: NEGATIVE
UROBILINOGEN UA: 0.2 (ref 0.0–1.0)
pH: 6 (ref 5.0–8.0)

## 2015-12-16 LAB — BASIC METABOLIC PANEL WITH GFR
BUN: 12 mg/dL (ref 6–23)
CO2: 31 meq/L (ref 19–32)
Calcium: 9.7 mg/dL (ref 8.4–10.5)
Chloride: 102 meq/L (ref 96–112)
Creatinine, Ser: 1.01 mg/dL (ref 0.40–1.50)
GFR: 96.1 mL/min (ref >60.00)
Glucose, Bld: 97 mg/dL (ref 70–99)
Potassium: 4.4 meq/L (ref 3.5–5.1)
Sodium: 140 meq/L (ref 135–145)

## 2015-12-16 LAB — PSA: PSA: 1.48 ng/mL (ref 0.10–4.00)

## 2015-12-16 LAB — LIPID PANEL
Cholesterol: 191 mg/dL (ref 0–200)
HDL: 58.7 mg/dL (ref >39.00)
LDL Cholesterol: 113 mg/dL — ABNORMAL HIGH (ref 0–99)
NONHDL: 132.61
Total CHOL/HDL Ratio: 3
Triglycerides: 98 mg/dL (ref 0.0–149.0)
VLDL: 19.6 mg/dL (ref 0.0–40.0)

## 2015-12-16 LAB — TESTOSTERONE: TESTOSTERONE: 284.69 ng/dL — AB (ref 300.00–890.00)

## 2015-12-16 LAB — TSH: TSH: 0.83 u[IU]/mL (ref 0.35–4.50)

## 2015-12-16 MED ORDER — TADALAFIL 20 MG PO TABS: 20.0000 mg | ORAL_TABLET | Freq: Every day | ORAL | Status: DC | PRN

## 2015-12-16 NOTE — Assessment & Plan Note (Signed)
We discussed age appropriate health related issues, including available/recomended screening tests and vaccinations. We discussed a need for adhering to healthy diet and exercise. Labs/EKG were reviewed/ordered. All questions were answered. Colonoscopy   

## 2015-12-16 NOTE — Progress Notes (Signed)
Pre visit review using our clinic review tool, if applicable. No additional management support is needed unless otherwise documented below in the visit note. 

## 2015-12-16 NOTE — Assessment & Plan Note (Signed)
Benicar, Norvasc

## 2015-12-16 NOTE — Assessment & Plan Note (Addendum)
Remote per [facility]

## 2015-12-16 NOTE — Assessment & Plan Note (Signed)
Cialis 20 prn

## 2015-12-16 NOTE — Progress Notes (Signed)
Subjective:  Patient ID: Jared Roth, male    DOB: [birth year]  Age: 64 y.o. MRN: [id number]  CC: Annual Exam   HPI Jared Roth presents for a well exam  Outpatient Prescriptions Prior to Visit  Medication Sig Dispense Refill  . amLODipine (NORVASC) 5 MG tablet Take 1 tablet (5 mg total) by mouth daily. 90 tablet 3  . famciclovir (FAMVIR) 250 MG tablet Take 1 tablet (250 mg total) by mouth 3 (three) times daily. 21 tablet 5  . olmesartan (BENICAR) 40 MG tablet Take 1 tablet (40 mg total) by mouth daily. D/C LOSARTAN 90 tablet 2  . sildenafil (REVATIO) 20 MG tablet  1-5 po as directed    . simvastatin (ZOCOR) 40 MG tablet Take 1 tablet (40 mg total) by mouth at bedtime. 90 tablet 2  . tadalafil (CIALIS) 5 MG tablet Take 1 tablet (5 mg total) by mouth daily as needed for erectile dysfunction. 90 tablet 3   No facility-administered medications prior to visit.    ROS Review of Systems  Constitutional: Negative for appetite change, fatigue and unexpected weight change.  HENT: Negative for congestion, nosebleeds, sneezing, sore throat and trouble swallowing.   Eyes: Negative for itching and visual disturbance.  Respiratory: Negative for cough.   Cardiovascular: Negative for chest pain, palpitations and leg swelling.  Gastrointestinal: Negative for nausea, diarrhea, blood in stool and abdominal distention.  Genitourinary: Negative for frequency and hematuria.  Musculoskeletal: Negative for back pain, joint swelling, gait problem and neck pain.  Skin: Negative for rash.  Neurological: Negative for dizziness, tremors, speech difficulty and weakness.  Psychiatric/Behavioral: Negative for suicidal ideas, sleep disturbance, dysphoric mood and agitation. The patient is not nervous/anxious.     Objective:  BP 150/88 mmHg  Pulse 60  Ht  (1.676 m)  Wt 178 lb (80.74 kg)  BMI 28.74 kg/m2  SpO2 98%  BP Readings from Last 3 Encounters:  12/16/15 150/88  08/02/15 136/82    12/08/14 139/80    Wt Readings from Last 3 Encounters:  12/16/15 178 lb (80.74 kg)  08/02/15 178 lb (80.74 kg)  12/08/14 177 lb (80.287 kg)    Physical Exam  Constitutional: He is oriented to person, place, and time. He appears well-developed and well-nourished. No distress.  HENT:  Head: Normocephalic and atraumatic.  Right Ear: External ear normal.  Left Ear: External ear normal.  Nose: Nose normal.  Mouth/Throat: Oropharynx is clear and moist. No oropharyngeal exudate.  Eyes: Conjunctivae and EOM are normal. Pupils are equal, round, and reactive to light. Right eye exhibits no discharge. Left eye exhibits no discharge. No scleral icterus.  Neck: Normal range of motion. Neck supple. No JVD present. No tracheal deviation present. No thyromegaly present.  Cardiovascular: Normal rate, regular rhythm, normal heart sounds and intact distal pulses.  Exam reveals no gallop and no friction rub.   No murmur heard. Pulmonary/Chest: Effort normal and breath sounds normal. No stridor. No respiratory distress. He has no wheezes. He has no rales. He exhibits no tenderness.  Abdominal: Soft. Bowel sounds are normal. He exhibits no distension and no mass. There is no tenderness. There is no rebound and no guarding.  Genitourinary: Rectum normal, prostate normal and penis normal. Guaiac negative stool. No penile tenderness.  Musculoskeletal: Normal range of motion. He exhibits no edema or tenderness.  Lymphadenopathy:    He has no cervical adenopathy.  Neurological: He is alert and oriented to person, place, and time. He has normal reflexes. No  cranial nerve deficit. He exhibits normal muscle tone. Coordination normal.  Skin: Skin is warm and dry. No rash noted. He is not diaphoretic. No erythema. No pallor.  Psychiatric: He has a normal mood and affect. His behavior is normal. Judgment and thought content normal.  Rectal - per GI this year  Lab Results  Component Value Date   WBC 4.3 12/08/2014    HGB 13.4 12/08/2014   HCT 39.0 12/08/2014   PLT 267.0 12/08/2014   GLUCOSE 104* 12/08/2014   CHOL 177 12/08/2014   TRIG 106.0 12/08/2014   HDL 56.40 12/08/2014   LDLDIRECT 172.5 09/02/2006   LDLCALC 99 12/08/2014   ALT 20 12/08/2014   AST 22 12/08/2014   NA 139 12/08/2014   K 4.3 12/08/2014   CL 103 12/08/2014   CREATININE 1.05 12/08/2014   BUN 13 12/08/2014   CO2 29 12/08/2014   TSH 1.08 12/08/2014   PSA 1.74 12/08/2014    Dg Chest 2 View  03/23/2011  *RADIOLOGY REPORT* Clinical Data: Physical exam.  Former smoker.  Hypertension. CHEST - 2 VIEW Comparison: 01/05/2009 Findings: Heart size and mediastinal contours are normal. No pleural effusion or pulmonary edema. There is no airspace consolidation identified. IMPRESSION: 1.  No active cardiopulmonary abnormalities. Normal chest. Original Report Authenticated By: Rosealee AlbeeAYLOR H. STROUD, M.D.   Assessment & Plan:   There are no diagnoses linked to this encounter. I am having [name] maintain his tadalafil, sildenafil, amLODipine, simvastatin, famciclovir, and olmesartan.  No orders of the defined types were placed in this encounter.     Follow-up: No Follow-up on file.  Sonda PrimesAlex Plotnikov, MD

## 2015-12-16 NOTE — Patient Instructions (Signed)
Zostavax

## 2015-12-16 NOTE — Assessment & Plan Note (Signed)
Simvastatin 

## 2015-12-17 DIAGNOSIS — R7989 Other specified abnormal findings of blood chemistry: Secondary | ICD-10-CM | POA: Insufficient documentation

## 2015-12-17 NOTE — Assessment & Plan Note (Signed)
See Lab tests comments

## 2016-04-13 ENCOUNTER — Other Ambulatory Visit: Payer: Self-pay | Admitting: Internal Medicine

## 2016-08-07 ENCOUNTER — Other Ambulatory Visit: Payer: Self-pay | Admitting: Internal Medicine

## 2016-11-09 ENCOUNTER — Other Ambulatory Visit: Payer: Self-pay | Admitting: Internal Medicine

## 2016-12-17 ENCOUNTER — Encounter: Payer: Self-pay | Admitting: Internal Medicine

## 2016-12-17 ENCOUNTER — Other Ambulatory Visit (INDEPENDENT_AMBULATORY_CARE_PROVIDER_SITE_OTHER): Payer: BLUE CROSS/BLUE SHIELD

## 2016-12-17 ENCOUNTER — Ambulatory Visit (INDEPENDENT_AMBULATORY_CARE_PROVIDER_SITE_OTHER): Payer: BLUE CROSS/BLUE SHIELD | Admitting: Internal Medicine

## 2016-12-17 DIAGNOSIS — I10 Essential (primary) hypertension: Secondary | ICD-10-CM | POA: Diagnosis not present

## 2016-12-17 DIAGNOSIS — R972 Elevated prostate specific antigen [PSA]: Secondary | ICD-10-CM | POA: Diagnosis not present

## 2016-12-17 DIAGNOSIS — Z0000 Encounter for general adult medical examination without abnormal findings: Secondary | ICD-10-CM

## 2016-12-17 LAB — HEPATIC FUNCTION PANEL
ALT: 20 U/L (ref 0–53)
AST: 28 U/L (ref 0–37)
Albumin: 4.3 g/dL (ref 3.5–5.2)
Alkaline Phosphatase: 55 U/L (ref 39–117)
BILIRUBIN DIRECT: 0.2 mg/dL (ref 0.0–0.3)
TOTAL PROTEIN: 7.5 g/dL (ref 6.0–8.3)
Total Bilirubin: 0.8 mg/dL (ref 0.2–1.2)

## 2016-12-17 LAB — LIPID PANEL
CHOL/HDL RATIO: 3
CHOLESTEROL: 190 mg/dL (ref 0–200)
HDL: 62.2 mg/dL (ref >39.00)
LDL Cholesterol: 113 mg/dL — ABNORMAL HIGH (ref 0–99)
NonHDL: 128.02
TRIGLYCERIDES: 77 mg/dL (ref 0.0–149.0)
VLDL: 15.4 mg/dL (ref 0.0–40.0)

## 2016-12-17 LAB — CBC WITH DIFFERENTIAL/PLATELET
Basophils Absolute: 0 10*3/uL (ref 0.0–0.1)
Basophils Relative: 0.4 % (ref 0.0–3.0)
EOS PCT: 1.2 % (ref 0.0–5.0)
Eosinophils Absolute: 0.1 10*3/uL (ref 0.0–0.7)
HCT: 39.3 % (ref 39.0–52.0)
Hemoglobin: 13.4 g/dL (ref 13.0–17.0)
LYMPHS ABS: 1.8 10*3/uL (ref 0.7–4.0)
Lymphocytes Relative: 35.3 % (ref 12.0–46.0)
MCHC: 34.3 g/dL (ref 30.0–36.0)
MCV: 94 fl (ref 78.0–100.0)
MONOS PCT: 14.5 % — AB (ref 3.0–12.0)
Monocytes Absolute: 0.7 10*3/uL (ref 0.1–1.0)
NEUTROS ABS: 2.4 10*3/uL (ref 1.4–7.7)
NEUTROS PCT: 48.6 % (ref 43.0–77.0)
PLATELETS: 264 10*3/uL (ref 150.0–400.0)
RBC: 4.18 Mil/uL — AB (ref 4.22–5.81)
RDW: 12.6 % (ref 11.5–15.5)
WBC: 5 10*3/uL (ref 4.0–10.5)

## 2016-12-17 LAB — URINALYSIS
BILIRUBIN URINE: NEGATIVE
HGB URINE DIPSTICK: NEGATIVE
KETONES UR: NEGATIVE
Leukocytes, UA: NEGATIVE
Nitrite: NEGATIVE
PH: 6.5 (ref 5.0–8.0)
Specific Gravity, Urine: 1.02 (ref 1.000–1.030)
TOTAL PROTEIN, URINE-UPE24: NEGATIVE
URINE GLUCOSE: NEGATIVE
Urobilinogen, UA: 0.2 (ref 0.0–1.0)

## 2016-12-17 LAB — BASIC METABOLIC PANEL
BUN: 9 mg/dL (ref 6–23)
CALCIUM: 9.5 mg/dL (ref 8.4–10.5)
CO2: 29 meq/L (ref 19–32)
CREATININE: 0.96 mg/dL (ref 0.40–1.50)
Chloride: 102 mEq/L (ref 96–112)
GFR: 101.57 mL/min (ref >60.00)
Glucose, Bld: 96 mg/dL (ref 70–99)
Potassium: 4.1 mEq/L (ref 3.5–5.1)
Sodium: 137 mEq/L (ref 135–145)

## 2016-12-17 LAB — PSA: PSA: 2.06 ng/mL (ref 0.10–4.00)

## 2016-12-17 LAB — TSH: TSH: 0.91 u[IU]/mL (ref 0.35–4.50)

## 2016-12-17 MED ORDER — LORATADINE 10 MG PO TABS: 10.0000 mg | ORAL_TABLET | Freq: Every day | ORAL | 3 refills | Status: DC

## 2016-12-17 NOTE — Assessment & Plan Note (Signed)
We discussed age appropriate health related issues, including available/recomended screening tests and vaccinations. We discussed a need for adhering to healthy diet and exercise. Labs were ordered. All questions were answered. 

## 2016-12-17 NOTE — Assessment & Plan Note (Signed)
On Benicar and Norvasc BP Readings from Last 3 Encounters:  12/17/16 140/80  12/16/15 139/79  08/02/15 136/82

## 2016-12-17 NOTE — Assessment & Plan Note (Signed)
Labs

## 2016-12-17 NOTE — Progress Notes (Signed)
Subjective:  Patient ID: Jared Roth, male    DOB: Aug 19, 1951  Age: 66 y.o. MRN: [id number]  CC: No chief complaint on file.   HPI Jared Roth presents for a well exam F/u HTN, dyslipidemia  Outpatient Medications Prior to Visit  Medication Sig Dispense Refill  . amLODipine (NORVASC) 5 MG tablet TAKE 1 TABLET (5 MG TOTAL) BY MOUTH DAILY. 90 tablet 3  . famciclovir (FAMVIR) 250 MG tablet Take 1 tablet (250 mg total) by mouth 3 (three) times daily. 21 tablet 5  . olmesartan (BENICAR) 40 MG tablet Take 1 tablet (40 mg total) by mouth daily. Yearly physical due in April must see MD for refills 30 tablet 0  . simvastatin (ZOCOR) 40 MG tablet TAKE 1 TABLET (40 MG TOTAL) BY MOUTH AT BEDTIME. 90 tablet 2  . tadalafil (CIALIS) 20 MG tablet Take 1 tablet (20 mg total) by mouth daily as needed for erectile dysfunction. 30 tablet 5   No facility-administered medications prior to visit.     ROS Review of Systems  Constitutional: Negative for appetite change, fatigue and unexpected weight change.  HENT: Negative for congestion, nosebleeds, sneezing, sore throat and trouble swallowing.   Eyes: Negative for itching and visual disturbance.  Respiratory: Negative for cough.   Cardiovascular: Negative for chest pain, palpitations and leg swelling.  Gastrointestinal: Negative for abdominal distention, blood in stool, diarrhea and nausea.  Genitourinary: Negative for frequency and hematuria.  Musculoskeletal: Negative for back pain, gait problem, joint swelling and neck pain.  Skin: Negative for rash.  Neurological: Negative for dizziness, tremors, speech difficulty and weakness.  Psychiatric/Behavioral: Negative for agitation, dysphoric mood, sleep disturbance and suicidal ideas. The patient is not nervous/anxious.     Objective:  BP (!) 156/88 (BP Location: Left Arm, Patient Position: Sitting, Cuff Size: Large)   Pulse (!) 56   Temp 98.6 F (37 C) (Oral)   Ht  (1.676 m)   Wt 174 lb  (78.9 kg)   SpO2 99%   BMI 28.08 kg/m   BP Readings from Last 3 Encounters:  12/17/16 (!) 156/88  12/16/15 139/79  08/02/15 136/82    Wt Readings from Last 3 Encounters:  12/17/16 174 lb (78.9 kg)  12/16/15 178 lb (80.7 kg)  08/02/15 178 lb (80.7 kg)    Physical Exam  Constitutional: He is oriented to person, place, and time. He appears well-developed. No distress.  NAD  HENT:  Mouth/Throat: Oropharynx is clear and moist.  Eyes: Conjunctivae are normal. Pupils are equal, round, and reactive to light.  Neck: Normal range of motion. No JVD present. No thyromegaly present.  Cardiovascular: Normal rate, regular rhythm, normal heart sounds and intact distal pulses.  Exam reveals no gallop and no friction rub.   No murmur heard. Pulmonary/Chest: Effort normal and breath sounds normal. No respiratory distress. He has no wheezes. He has no rales. He exhibits no tenderness.  Abdominal: Soft. Bowel sounds are normal. He exhibits no distension and no mass. There is no tenderness. There is no rebound and no guarding.  Genitourinary: Rectum normal and penis normal. Rectal exam shows guaiac negative stool.  Musculoskeletal: Normal range of motion. He exhibits no edema or tenderness.  Lymphadenopathy:    He has no cervical adenopathy.  Neurological: He is alert and oriented to person, place, and time. He has normal reflexes. No cranial nerve deficit. He exhibits normal muscle tone. He displays a negative Romberg sign. Coordination and gait normal.  Skin: Skin is warm  and dry. No rash noted.  Psychiatric: He has a normal mood and affect. His behavior is normal. Judgment and thought content normal.  prostate 1+  Lab Results  Component Value Date   WBC 5.5 12/16/2015   HGB 13.6 12/16/2015   HCT 40.3 12/16/2015   PLT 271.0 12/16/2015   GLUCOSE 97 12/16/2015   CHOL 191 12/16/2015   TRIG 98.0 12/16/2015   HDL 58.70 12/16/2015   LDLDIRECT 172.5 09/02/2006   LDLCALC 113 (H) 12/16/2015   ALT  20 12/16/2015   AST 22 12/16/2015   NA 140 12/16/2015   K 4.4 12/16/2015   CL 102 12/16/2015   CREATININE 1.01 12/16/2015   BUN 12 12/16/2015   CO2 31 12/16/2015   TSH 0.83 12/16/2015   PSA 1.48 12/16/2015    Dg Chest 2 View  Result Date: 03/23/2011 *RADIOLOGY REPORT* Clinical Data: Physical exam.  Former smoker.  Hypertension. CHEST - 2 VIEW Comparison: 01/05/2009 Findings: Heart size and mediastinal contours are normal. No pleural effusion or pulmonary edema. There is no airspace consolidation identified. IMPRESSION: 1.  No active cardiopulmonary abnormalities. Normal chest. Original Report Authenticated By: Rosealee Albee, M.D.   Assessment & Plan:   There are no diagnoses linked to this encounter. I am having Mr. Stierwalt maintain his famciclovir, tadalafil, amLODipine, simvastatin, and olmesartan.  No orders of the defined types were placed in this encounter.    Follow-up: No Follow-up on file.  Sonda Primes, MD

## 2016-12-17 NOTE — Patient Instructions (Signed)
Shingrix

## 2016-12-17 NOTE — Progress Notes (Signed)
Pre visit review using our clinic review tool, if applicable. No additional management support is needed unless otherwise documented below in the visit note. 

## 2017-01-23 ENCOUNTER — Other Ambulatory Visit: Payer: Self-pay | Admitting: Internal Medicine

## 2017-01-30 ENCOUNTER — Other Ambulatory Visit: Payer: Self-pay | Admitting: Internal Medicine

## 2017-02-28 ENCOUNTER — Other Ambulatory Visit: Payer: Self-pay | Admitting: Internal Medicine

## 2017-04-24 ENCOUNTER — Telehealth: Payer: Self-pay | Admitting: Internal Medicine

## 2017-04-24 MED ORDER — OLMESARTAN MEDOXOMIL 40 MG PO TABS: 40.0000 mg | ORAL_TABLET | Freq: Every day | ORAL | 1 refills | Status: DC

## 2017-04-24 NOTE — Telephone Encounter (Signed)
Reviewed chart pt is up-to-date [facility]

## 2017-04-24 NOTE — Telephone Encounter (Signed)
Pt called in and would like [facility] [address] in [place].  olmesartan (BENICAR) 40 MG tablet [[id number]]    90 day supply

## 2017-04-28 ENCOUNTER — Other Ambulatory Visit: Payer: Self-pay | Admitting: Internal Medicine

## 2017-04-30 ENCOUNTER — Other Ambulatory Visit: Payer: Self-pay | Admitting: General Practice

## 2017-04-30 MED ORDER — AMLODIPINE BESYLATE 5 MG PO TABS: 5.0000 mg | ORAL_TABLET | Freq: Every day | ORAL | 1 refills | Status: DC

## 2017-06-18 ENCOUNTER — Encounter: Payer: Self-pay | Admitting: Internal Medicine

## 2017-06-18 ENCOUNTER — Ambulatory Visit (INDEPENDENT_AMBULATORY_CARE_PROVIDER_SITE_OTHER): Payer: BLUE CROSS/BLUE SHIELD | Admitting: Internal Medicine

## 2017-06-18 ENCOUNTER — Other Ambulatory Visit (INDEPENDENT_AMBULATORY_CARE_PROVIDER_SITE_OTHER): Payer: BLUE CROSS/BLUE SHIELD

## 2017-06-18 VITALS — BP 162/100 | HR 61 | Temp 98.0°F | Ht 66.0 in | Wt 178.0 lb

## 2017-06-18 DIAGNOSIS — R972 Elevated prostate specific antigen [PSA]: Secondary | ICD-10-CM | POA: Diagnosis not present

## 2017-06-18 DIAGNOSIS — I10 Essential (primary) hypertension: Secondary | ICD-10-CM

## 2017-06-18 DIAGNOSIS — Z23 Encounter for immunization: Secondary | ICD-10-CM

## 2017-06-18 DIAGNOSIS — N400 Enlarged prostate without lower urinary tract symptoms: Secondary | ICD-10-CM

## 2017-06-18 LAB — BASIC METABOLIC PANEL
BUN: 12 mg/dL (ref 6–23)
CHLORIDE: 102 meq/L (ref 96–112)
CO2: 30 meq/L (ref 19–32)
Calcium: 9.4 mg/dL (ref 8.4–10.5)
Creatinine, Ser: 0.94 mg/dL (ref 0.40–1.50)
GFR: 103.9 mL/min (ref >60.00)
Glucose, Bld: 107 mg/dL — ABNORMAL HIGH (ref 70–99)
POTASSIUM: 4 meq/L (ref 3.5–5.1)
Sodium: 139 mEq/L (ref 135–145)

## 2017-06-18 LAB — PSA: PSA: 2.32 ng/mL (ref 0.10–4.00)

## 2017-06-18 NOTE — Patient Instructions (Signed)
Athletic underwear - nylon or blend

## 2017-06-18 NOTE — Assessment & Plan Note (Signed)
Monitor PSA 

## 2017-06-18 NOTE — Assessment & Plan Note (Signed)
BP Readings from Last 3 Encounters:  06/18/17 (!) 162/100  12/17/16 140/80  12/16/15 139/79

## 2017-06-18 NOTE — Assessment & Plan Note (Signed)
Doing well 

## 2017-06-18 NOTE — Progress Notes (Signed)
Subjective:  Patient ID: Jared Roth, male    DOB: 30-Aug-1950  Age: 66 y.o. MRN: [id number]  CC: No chief complaint on file.   HPI Jared Roth presents for HTN, GERD, BPH, dyslipidemia f/u C/o sensitive testes  Outpatient Medications Prior to Visit  Medication Sig Dispense Refill  . amLODipine (NORVASC) 5 MG tablet Take 1 tablet (5 mg total) by mouth daily. 90 tablet 1  . famciclovir (FAMVIR) 250 MG tablet TAKE 1 TABLET BY MOUTH THREE TIMES A DAY 21 tablet 1  . loratadine (CLARITIN) 10 MG tablet Take 1 tablet (10 mg total) by mouth daily. 100 tablet 3  . olmesartan (BENICAR) 40 MG tablet Take 1 tablet (40 mg total) by mouth daily. 90 tablet 1  . simvastatin (ZOCOR) 40 MG tablet TAKE 1 TABLET (40 MG TOTAL) BY MOUTH AT BEDTIME. 90 tablet 2  . tadalafil (CIALIS) 20 MG tablet Take 1 tablet (20 mg total) by mouth daily as needed for erectile dysfunction. 30 tablet 5   No facility-administered medications prior to visit.     ROS Review of Systems  Constitutional: Negative for appetite change, fatigue and unexpected weight change.  HENT: Negative for congestion, nosebleeds, sneezing, sore throat and trouble swallowing.   Eyes: Negative for itching and visual disturbance.  Respiratory: Negative for cough.   Cardiovascular: Negative for chest pain, palpitations and leg swelling.  Gastrointestinal: Negative for abdominal distention, blood in stool, diarrhea and nausea.  Genitourinary: Negative for frequency and hematuria.  Musculoskeletal: Negative for back pain, gait problem, joint swelling and neck pain.  Skin: Negative for rash.  Neurological: Negative for dizziness, tremors, speech difficulty and weakness.  Psychiatric/Behavioral: Negative for agitation, dysphoric mood and sleep disturbance. The patient is not nervous/anxious.     Objective:  Ht 5\' 6"  (1.676 m)   Wt 178 lb (80.7 kg)   BMI 28.73 kg/m   BP Readings from Last 3 Encounters:  12/17/16 140/80  12/16/15 139/79    08/02/15 136/82    Wt Readings from Last 3 Encounters:  06/18/17 178 lb (80.7 kg)  12/17/16 174 lb (78.9 kg)  12/16/15 178 lb (80.7 kg)    Physical Exam  Constitutional: He is oriented to person, place, and time. He appears well-developed. No distress.  NAD  HENT:  Mouth/Throat: Oropharynx is clear and moist.  Eyes: Pupils are equal, round, and reactive to light. Conjunctivae are normal.  Neck: Normal range of motion. No JVD present. No thyromegaly present.  Cardiovascular: Normal rate, regular rhythm, normal heart sounds and intact distal pulses.  Exam reveals no gallop and no friction rub.   No murmur heard. Pulmonary/Chest: Effort normal and breath sounds normal. No respiratory distress. He has no wheezes. He has no rales. He exhibits no tenderness.  Abdominal: Soft. Bowel sounds are normal. He exhibits no distension and no mass. There is no tenderness. There is no rebound and no guarding.  Musculoskeletal: Normal range of motion. He exhibits no edema or tenderness.  Lymphadenopathy:    He has no cervical adenopathy.  Neurological: He is alert and oriented to person, place, and time. He has normal reflexes. No cranial nerve deficit. He exhibits normal muscle tone. He displays a negative Romberg sign. Coordination and gait normal.  Skin: Skin is warm and dry. No rash noted.  Psychiatric: He has a normal mood and affect. His behavior is normal. Judgment and thought content normal.  B testes - WNL  Lab Results  Component Value Date   WBC 5.0  12/17/2016   HGB 13.4 12/17/2016   HCT 39.3 12/17/2016   PLT 264.0 12/17/2016   GLUCOSE 96 12/17/2016   CHOL 190 12/17/2016   TRIG 77.0 12/17/2016   HDL 62.20 12/17/2016   LDLDIRECT 172.5 09/02/2006   LDLCALC 113 (H) 12/17/2016   ALT 20 12/17/2016   AST 28 12/17/2016   NA 137 12/17/2016   K 4.1 12/17/2016   CL 102 12/17/2016   CREATININE 0.96 12/17/2016   BUN 9 12/17/2016   CO2 29 12/17/2016   TSH 0.91 12/17/2016   PSA 2.06  12/17/2016    Dg Chest 2 View  Result Date: 03/23/2011 *RADIOLOGY REPORT* Clinical Data: Physical exam.  Former smoker.  Hypertension. CHEST - 2 VIEW Comparison: 01/05/2009 Findings: Heart size and mediastinal contours are normal. No pleural effusion or pulmonary edema. There is no airspace consolidation identified. IMPRESSION: 1.  No active cardiopulmonary abnormalities. Normal chest. Original Report Authenticated By: Rosealee AlbeeAYLOR H. STROUD, M.D.   Assessment & Plan:   Diagnoses and all orders for this visit:  Flu vaccine need -     Flu Vaccine QUAD 6+ mos PF IM (Fluarix Quad PF)   I am having Mr. Johnsen maintain his tadalafil, loratadine, simvastatin, famciclovir, olmesartan, and amLODipine.  No orders of the defined types were placed in this encounter.    Follow-up: No Follow-up on file.  Sonda PrimesAlex Denise Washburn, MD

## 2017-09-24 ENCOUNTER — Ambulatory Visit: Payer: BLUE CROSS/BLUE SHIELD | Admitting: Internal Medicine

## 2017-10-28 ENCOUNTER — Other Ambulatory Visit: Payer: Self-pay | Admitting: Internal Medicine

## 2017-11-23 ENCOUNTER — Other Ambulatory Visit: Payer: Self-pay | Admitting: Internal Medicine

## 2017-12-03 ENCOUNTER — Telehealth: Payer: Self-pay | Admitting: Internal Medicine

## 2017-12-03 NOTE — Telephone Encounter (Signed)
Copied from CRM [id number]. Topic: Quick Communication - See Telephone Encounter >> Dec 03, 2017  1:40 PM Arlyss Gandyichardson, Rasmus Preusser N, NT wrote: CRM for notification. See Telephone encounter for: 12/03/17. Pt states that the pharmacy stated the olmesartan [facility](BENICAR) is on back order and he would need something else called in to replace this one. [facility] [id number] - [place], [place] - [address] [phone] (Phone) [phone] (Fax)

## 2017-12-05 NOTE — Telephone Encounter (Signed)
Please advise 

## 2017-12-05 NOTE — Telephone Encounter (Signed)
Pt calling to check status of this RX. Pharmacy has informed him they have requested this change for 2 weeks and have not received a reply. Pt ran out yesterday. Please advise.

## 2017-12-05 NOTE — Telephone Encounter (Signed)
Okay Micardis 80 mg once a day instead #90 with 3 refills. Thank you

## 2017-12-06 MED ORDER — TELMISARTAN 80 MG PO TABS: 80.0000 mg | ORAL_TABLET | Freq: Every day | ORAL | 3 refills | Status: DC

## 2017-12-06 NOTE — Telephone Encounter (Signed)
rx sent

## 2017-12-18 ENCOUNTER — Encounter: Payer: Self-pay | Admitting: Internal Medicine

## 2017-12-18 ENCOUNTER — Ambulatory Visit: Payer: BLUE CROSS/BLUE SHIELD | Admitting: Internal Medicine

## 2017-12-18 ENCOUNTER — Other Ambulatory Visit (INDEPENDENT_AMBULATORY_CARE_PROVIDER_SITE_OTHER): Payer: BLUE CROSS/BLUE SHIELD

## 2017-12-18 VITALS — BP 142/84 | HR 58 | Temp 98.1°F | Ht 66.0 in | Wt 175.0 lb

## 2017-12-18 DIAGNOSIS — H9319 Tinnitus, unspecified ear: Secondary | ICD-10-CM | POA: Diagnosis not present

## 2017-12-18 DIAGNOSIS — R972 Elevated prostate specific antigen [PSA]: Secondary | ICD-10-CM

## 2017-12-18 DIAGNOSIS — I10 Essential (primary) hypertension: Secondary | ICD-10-CM

## 2017-12-18 DIAGNOSIS — Z0000 Encounter for general adult medical examination without abnormal findings: Secondary | ICD-10-CM | POA: Diagnosis not present

## 2017-12-18 LAB — BASIC METABOLIC PANEL
BUN: 10 mg/dL (ref 6–23)
CALCIUM: 9.3 mg/dL (ref 8.4–10.5)
CO2: 30 meq/L (ref 19–32)
Chloride: 101 mEq/L (ref 96–112)
Creatinine, Ser: 0.95 mg/dL (ref 0.40–1.50)
GFR: 102.48 mL/min (ref >60.00)
GLUCOSE: 103 mg/dL — AB (ref 70–99)
POTASSIUM: 4.3 meq/L (ref 3.5–5.1)
SODIUM: 138 meq/L (ref 135–145)

## 2017-12-18 LAB — HEPATIC FUNCTION PANEL
ALBUMIN: 4.2 g/dL (ref 3.5–5.2)
ALT: 20 U/L (ref 0–53)
AST: 23 U/L (ref 0–37)
Alkaline Phosphatase: 51 U/L (ref 39–117)
BILIRUBIN TOTAL: 0.6 mg/dL (ref 0.2–1.2)
Bilirubin, Direct: 0.1 mg/dL (ref 0.0–0.3)
Total Protein: 7.6 g/dL (ref 6.0–8.3)

## 2017-12-18 LAB — CBC WITH DIFFERENTIAL/PLATELET
BASOS PCT: 0.8 % (ref 0.0–3.0)
Basophils Absolute: 0 10*3/uL (ref 0.0–0.1)
Eosinophils Absolute: 0.1 10*3/uL (ref 0.0–0.7)
Eosinophils Relative: 1.8 % (ref 0.0–5.0)
HEMATOCRIT: 38.2 % — AB (ref 39.0–52.0)
Hemoglobin: 13 g/dL (ref 13.0–17.0)
LYMPHS ABS: 1.4 10*3/uL (ref 0.7–4.0)
LYMPHS PCT: 31.8 % (ref 12.0–46.0)
MCHC: 34 g/dL (ref 30.0–36.0)
MCV: 94.6 fl (ref 78.0–100.0)
MONOS PCT: 12.8 % — AB (ref 3.0–12.0)
Monocytes Absolute: 0.6 10*3/uL (ref 0.1–1.0)
NEUTROS ABS: 2.3 10*3/uL (ref 1.4–7.7)
NEUTROS PCT: 52.8 % (ref 43.0–77.0)
Platelets: 299 10*3/uL (ref 150.0–400.0)
RBC: 4.04 Mil/uL — ABNORMAL LOW (ref 4.22–5.81)
RDW: 13.2 % (ref 11.5–15.5)
WBC: 4.4 10*3/uL (ref 4.0–10.5)

## 2017-12-18 LAB — URINALYSIS
Bilirubin Urine: NEGATIVE
Hgb urine dipstick: NEGATIVE
KETONES UR: NEGATIVE
Leukocytes, UA: NEGATIVE
Nitrite: NEGATIVE
PH: 7 (ref 5.0–8.0)
Specific Gravity, Urine: 1.01 (ref 1.000–1.030)
TOTAL PROTEIN, URINE-UPE24: NEGATIVE
URINE GLUCOSE: NEGATIVE
UROBILINOGEN UA: 0.2 (ref 0.0–1.0)

## 2017-12-18 LAB — LIPID PANEL
CHOLESTEROL: 190 mg/dL (ref 0–200)
HDL: 63.9 mg/dL (ref >39.00)
LDL Cholesterol: 105 mg/dL — ABNORMAL HIGH (ref 0–99)
NonHDL: 126.33
TRIGLYCERIDES: 107 mg/dL (ref 0.0–149.0)
Total CHOL/HDL Ratio: 3
VLDL: 21.4 mg/dL (ref 0.0–40.0)

## 2017-12-18 LAB — PSA: PSA: 2.67 ng/mL (ref 0.10–4.00)

## 2017-12-18 LAB — TSH: TSH: 0.79 u[IU]/mL (ref 0.35–4.50)

## 2017-12-18 MED ORDER — B COMPLEX PO TABS: 1.0000 | ORAL_TABLET | Freq: Every day | ORAL | 3 refills | Status: AC

## 2017-12-18 MED ORDER — VITAMIN D3 50 MCG (2000 UT) PO CAPS: 2000.0000 [IU] | ORAL_CAPSULE | Freq: Every day | ORAL | 3 refills | Status: AC

## 2017-12-18 NOTE — Patient Instructions (Addendum)
Hold Simvastatin x 1 month  Vit D Vit B complex Options to use Neurontin, Diazepam for tinnitus

## 2017-12-18 NOTE — Assessment & Plan Note (Signed)
Micardis, Norvasc 

## 2017-12-18 NOTE — Assessment & Plan Note (Addendum)
We discussed age appropriate health related issues, including available/recomended screening tests and vaccinations. We discussed a need for adhering to healthy diet and exercise. Labs/EKG were reviewed/ordered. All questions were answered. Shingrix

## 2017-12-18 NOTE — Progress Notes (Signed)
Subjective:  Patient ID: Jared Roth, male    DOB: 29-Jan-1951  Age: 67 y.o. MRN: [id number]  CC: No chief complaint on file.   HPI Jared Roth presents for a well exam F/u  tinnitus (seen by ENT, brain MRI was ok), allergies, HTN C/o throat spasms, yawning stops it  Outpatient Medications Prior to Visit  Medication Sig Dispense Refill  . amLODipine (NORVASC) 5 MG tablet Take 1 tablet (5 mg total) by mouth daily. Annual appt due in April must see provider for future refills 90 tablet 0  . famciclovir (FAMVIR) 250 MG tablet TAKE 1 TABLET BY MOUTH THREE TIMES A DAY 21 tablet 1  . loratadine (CLARITIN) 10 MG tablet Take 1 tablet (10 mg total) by mouth daily. 100 tablet 3  . olmesartan (BENICAR) 40 MG tablet TAKE 1 TABLET BY MOUTH EVERY DAY 90 tablet 3  . simvastatin (ZOCOR) 40 MG tablet Take 1 tablet (40 mg total) by mouth at bedtime. Annual appt due in April must see provider for future refills 90 tablet 0  . tadalafil (CIALIS) 20 MG tablet Take 1 tablet (20 mg total) by mouth daily as needed for erectile dysfunction. 30 tablet 5  . telmisartan (MICARDIS) 80 MG tablet Take 1 tablet (80 mg total) by mouth daily. 90 tablet 3   No facility-administered medications prior to visit.     ROS Review of Systems  Constitutional: Negative for appetite change, fatigue and unexpected weight change.  HENT: Positive for tinnitus. Negative for congestion, nosebleeds, sneezing, sore throat and trouble swallowing.   Eyes: Negative for itching and visual disturbance.  Respiratory: Negative for cough.   Cardiovascular: Negative for chest pain, palpitations and leg swelling.  Gastrointestinal: Negative for abdominal distention, blood in stool, diarrhea and nausea.  Genitourinary: Positive for frequency. Negative for hematuria.  Musculoskeletal: Negative for back pain, gait problem, joint swelling and neck pain.  Skin: Negative for rash.  Neurological: Negative for dizziness, tremors, speech  difficulty and weakness.  Psychiatric/Behavioral: Negative for agitation, dysphoric mood, sleep disturbance and suicidal ideas. The patient is not nervous/anxious.     Objective:  BP (!) 142/84 (BP Location: Left Arm, Patient Position: Sitting, Cuff Size: Large)   Pulse (!) 58   Temp 98.1 F (36.7 C) (Oral)   Ht 5\' 6"  (1.676 m)   Wt 175 lb (79.4 kg)   SpO2 99%   BMI 28.25 kg/m   BP Readings from Last 3 Encounters:  12/18/17 (!) 142/84  06/18/17 (!) 162/100  12/17/16 140/80    Wt Readings from Last 3 Encounters:  12/18/17 175 lb (79.4 kg)  06/18/17 178 lb (80.7 kg)  12/17/16 174 lb (78.9 kg)    Physical Exam  Constitutional: He is oriented to person, place, and time. He appears well-developed. No distress.  NAD  HENT:  Mouth/Throat: Oropharynx is clear and moist.  Eyes: Pupils are equal, round, and reactive to light. Conjunctivae are normal.  Neck: Normal range of motion. No JVD present. No thyromegaly present.  Cardiovascular: Normal rate, regular rhythm, normal heart sounds and intact distal pulses. Exam reveals no gallop and no friction rub.  No murmur heard. Pulmonary/Chest: Effort normal and breath sounds normal. No respiratory distress. He has no wheezes. He has no rales. He exhibits no tenderness.  Abdominal: Soft. Bowel sounds are normal. He exhibits no distension and no mass. There is no tenderness. There is no rebound and no guarding.  Musculoskeletal: Normal range of motion. He exhibits no edema or tenderness.  Lymphadenopathy:    He has no cervical adenopathy.  Neurological: He is alert and oriented to person, place, and time. He has normal reflexes. No cranial nerve deficit. He exhibits normal muscle tone. He displays a negative Romberg sign. Coordination and gait normal.  Skin: Skin is warm and dry. No rash noted.  Psychiatric: He has a normal mood and affect. His behavior is normal. Judgment and thought content normal.    Lab Results  Component Value Date     WBC 5.0 12/17/2016   HGB 13.4 12/17/2016   HCT 39.3 12/17/2016   PLT 264.0 12/17/2016   GLUCOSE 107 (H) 06/18/2017   CHOL 190 12/17/2016   TRIG 77.0 12/17/2016   HDL 62.20 12/17/2016   LDLDIRECT 172.5 09/02/2006   LDLCALC 113 (H) 12/17/2016   ALT 20 12/17/2016   AST 28 12/17/2016   NA 139 06/18/2017   K 4.0 06/18/2017   CL 102 06/18/2017   CREATININE 0.94 06/18/2017   BUN 12 06/18/2017   CO2 30 06/18/2017   TSH 0.91 12/17/2016   PSA 2.32 06/18/2017    Dg Chest 2 View  Result Date: 03/23/2011 *RADIOLOGY REPORT* Clinical Data: Physical exam.  Former smoker.  Hypertension. CHEST - 2 VIEW Comparison: 01/05/2009 Findings: Heart size and mediastinal contours are normal. No pleural effusion or pulmonary edema. There is no airspace consolidation identified. IMPRESSION: 1.  No active cardiopulmonary abnormalities. Normal chest. Original Report Authenticated By: Rosealee Albee, M.D.   Assessment & Plan:   There are no diagnoses linked to this encounter. I am having Vanna E. Furgason maintain his tadalafil, loratadine, famciclovir, simvastatin, amLODipine, olmesartan, and telmisartan.  No orders of the defined types were placed in this encounter.    Follow-up: No follow-ups on file.  Sonda Primes, MD

## 2017-12-18 NOTE — Assessment & Plan Note (Signed)
PSA

## 2017-12-18 NOTE — Assessment & Plan Note (Signed)
     seen by ENT, brain MRI was ok)

## 2018-01-23 ENCOUNTER — Ambulatory Visit (INDEPENDENT_AMBULATORY_CARE_PROVIDER_SITE_OTHER): Payer: BLUE CROSS/BLUE SHIELD | Admitting: *Deleted

## 2018-01-23 DIAGNOSIS — Z23 Encounter for immunization: Secondary | ICD-10-CM

## 2018-03-12 ENCOUNTER — Ambulatory Visit (INDEPENDENT_AMBULATORY_CARE_PROVIDER_SITE_OTHER)
Admission: RE | Admit: 2018-03-12 | Discharge: 2018-03-12 | Disposition: A | Discharge status: Home / Self Care | Payer: BLUE CROSS/BLUE SHIELD | Source: Ambulatory Visit | Attending: Internal Medicine | Admitting: Internal Medicine

## 2018-03-12 ENCOUNTER — Ambulatory Visit: Payer: BLUE CROSS/BLUE SHIELD | Admitting: Internal Medicine

## 2018-03-12 ENCOUNTER — Other Ambulatory Visit: Payer: Self-pay | Admitting: Internal Medicine

## 2018-03-12 ENCOUNTER — Other Ambulatory Visit: Payer: BLUE CROSS/BLUE SHIELD

## 2018-03-12 ENCOUNTER — Encounter: Payer: Self-pay | Admitting: Internal Medicine

## 2018-03-12 VITALS — BP 132/78 | HR 56 | Temp 97.6°F | Ht 66.0 in | Wt 172.0 lb

## 2018-03-12 DIAGNOSIS — R0789 Other chest pain: Secondary | ICD-10-CM | POA: Insufficient documentation

## 2018-03-12 DIAGNOSIS — Z23 Encounter for immunization: Secondary | ICD-10-CM

## 2018-03-12 DIAGNOSIS — R079 Chest pain, unspecified: Secondary | ICD-10-CM

## 2018-03-12 DIAGNOSIS — R972 Elevated prostate specific antigen [PSA]: Secondary | ICD-10-CM | POA: Diagnosis not present

## 2018-03-12 DIAGNOSIS — I10 Essential (primary) hypertension: Secondary | ICD-10-CM | POA: Diagnosis not present

## 2018-03-12 DIAGNOSIS — K219 Gastro-esophageal reflux disease without esophagitis: Secondary | ICD-10-CM

## 2018-03-12 MED ORDER — PANTOPRAZOLE SODIUM 40 MG PO TBEC: 40.0000 mg | DELAYED_RELEASE_TABLET | Freq: Every day | ORAL | 5 refills | Status: DC

## 2018-03-12 NOTE — Assessment & Plan Note (Signed)
PSA

## 2018-03-12 NOTE — Assessment & Plan Note (Signed)
Micardis Norvasc

## 2018-03-12 NOTE — Patient Instructions (Signed)
Alinda DoomsGaudi Dali

## 2018-03-12 NOTE — Telephone Encounter (Signed)
Insurance would not cover the pantoprazole. Alternative is Lansoprazole. [facility]

## 2018-03-12 NOTE — Progress Notes (Signed)
Subjective:  Patient ID: Jared HolterSam E Scheirer, male    DOB: [birth year]  Age: 67 y.o. MRN: [id number]  CC: No chief complaint on file.   HPI Jared Roth presents for HTN C/o L thumb cyst C/o ?GERD sx's, ?CP - short episodes  Outpatient Medications Prior to Visit  Medication Sig Dispense Refill  . amLODipine (NORVASC) 5 MG tablet Take 1 tablet (5 mg total) by mouth daily. Annual appt due in April must see provider for future refills 90 tablet 0  . b complex vitamins tablet Take 1 tablet by mouth daily. 100 tablet 3  . Cholecalciferol (VITAMIN D3) 2000 units capsule Take 1 capsule (2,000 Units total) by mouth daily. 100 capsule 3  . famciclovir (FAMVIR) 250 MG tablet TAKE 1 TABLET BY MOUTH THREE TIMES A DAY 21 tablet 1  . loratadine (CLARITIN) 10 MG tablet Take 1 tablet (10 mg total) by mouth daily. 100 tablet 3  . simvastatin (ZOCOR) 40 MG tablet Take 1 tablet (40 mg total) by mouth at bedtime. Annual appt due in April must see provider for future refills 90 tablet 0  . tadalafil (CIALIS) 20 MG tablet Take 1 tablet (20 mg total) by mouth daily as needed for erectile dysfunction. 30 tablet 5  . telmisartan (MICARDIS) 80 MG tablet Take 1 tablet (80 mg total) by mouth daily. 90 tablet 3   No facility-administered medications prior to visit.     ROS: Review of Systems  Constitutional: Negative for appetite change, fatigue and unexpected weight change.  HENT: Negative for congestion, nosebleeds, sneezing, sore throat and trouble swallowing.   Eyes: Negative for itching and visual disturbance.  Respiratory: Negative for cough.   Cardiovascular: Negative for chest pain, palpitations and leg swelling.  Gastrointestinal: Negative for abdominal distention, blood in stool, diarrhea and nausea.  Genitourinary: Negative for frequency and hematuria.  Musculoskeletal: Negative for back pain, gait problem, joint swelling and neck pain.  Skin: Negative for rash.  Neurological: Negative for dizziness,  tremors, speech difficulty and weakness.  Psychiatric/Behavioral: Negative for agitation, dysphoric mood and sleep disturbance. The patient is not nervous/anxious.     Objective:  BP 132/78 (BP Location: Left Arm, Patient Position: Sitting, Cuff Size: Large)   Pulse (!) 56   Temp 97.6 F (36.4 C) (Oral)   Ht 5\' 6"  (1.676 m)   Wt 172 lb (78 kg)   SpO2 99%   BMI 27.76 kg/m   BP Readings from Last 3 Encounters:  03/12/18 132/78  12/18/17 (!) 142/84  06/18/17 (!) 162/100    Wt Readings from Last 3 Encounters:  03/12/18 172 lb (78 kg)  12/18/17 175 lb (79.4 kg)  06/18/17 178 lb (80.7 kg)    Physical Exam  Constitutional: He is oriented to person, place, and time. He appears well-developed. No distress.  NAD  HENT:  Mouth/Throat: Oropharynx is clear and moist.  Eyes: Pupils are equal, round, and reactive to light. Conjunctivae are normal.  Neck: Normal range of motion. No JVD present. No thyromegaly present.  Cardiovascular: Normal rate, regular rhythm, normal heart sounds and intact distal pulses. Exam reveals no gallop and no friction rub.  No murmur heard. Pulmonary/Chest: Effort normal and breath sounds normal. No respiratory distress. He has no wheezes. He has no rales. He exhibits no tenderness.  Abdominal: Soft. Bowel sounds are normal. He exhibits no distension and no mass. There is no tenderness. There is no rebound and no guarding.  Musculoskeletal: Normal range of motion. He exhibits no edema  or tenderness.  Lymphadenopathy:    He has no cervical adenopathy.  Neurological: He is alert and oriented to person, place, and time. He has normal reflexes. No cranial nerve deficit. He exhibits normal muscle tone. He displays a negative Romberg sign. Coordination and gait normal.  Skin: Skin is warm and dry. No rash noted.  Psychiatric: He has a normal mood and affect. His behavior is normal. Judgment and thought content normal.   Procedure: EKG Indication: chest  pain Impression: S brady. No acute changes.  Lab Results  Component Value Date   WBC 4.4 12/18/2017   HGB 13.0 12/18/2017   HCT 38.2 (L) 12/18/2017   PLT 299.0 12/18/2017   GLUCOSE 103 (H) 12/18/2017   CHOL 190 12/18/2017   TRIG 107.0 12/18/2017   HDL 63.90 12/18/2017   LDLDIRECT 172.5 09/02/2006   LDLCALC 105 (H) 12/18/2017   ALT 20 12/18/2017   AST 23 12/18/2017   NA 138 12/18/2017   K 4.3 12/18/2017   CL 101 12/18/2017   CREATININE 0.95 12/18/2017   BUN 10 12/18/2017   CO2 30 12/18/2017   TSH 0.79 12/18/2017   PSA 2.67 12/18/2017    Dg Chest 2 View  Result Date: 03/23/2011 *RADIOLOGY REPORT* Clinical Data: Physical exam.  Former smoker.  Hypertension. CHEST - 2 VIEW Comparison: 01/05/2009 Findings: Heart size and mediastinal contours are normal. No pleural effusion or pulmonary edema. There is no airspace consolidation identified. IMPRESSION: 1.  No active cardiopulmonary abnormalities. Normal chest. Original Report Authenticated By: Rosealee Albee, M.D.   Assessment & Plan:   Diagnoses and all orders for this visit:  Need for shingles vaccine -     Varicella-zoster vaccine IM (Shingrix)     No orders of the defined types were placed in this encounter.    Follow-up: No follow-ups on file.  Sonda Primes, MD

## 2018-03-12 NOTE — Assessment & Plan Note (Addendum)
Prevacid  

## 2018-03-12 NOTE — Assessment & Plan Note (Signed)
?  GERD related CXR EKG Treat GERD

## 2018-03-21 ENCOUNTER — Ambulatory Visit: Payer: BLUE CROSS/BLUE SHIELD | Admitting: Internal Medicine

## 2018-05-01 ENCOUNTER — Telehealth: Payer: Self-pay | Admitting: Internal Medicine

## 2018-05-01 MED ORDER — TADALAFIL 20 MG PO TABS: 20.0000 mg | ORAL_TABLET | Freq: Every day | ORAL | 0 refills | Status: DC | PRN

## 2018-05-01 NOTE — Telephone Encounter (Signed)
Copied from CRM [id number]. Topic: Quick Communication - Rx Refill/Question >> May 01, 2018  3:02 PM Mcneil, Ja-Kwan wrote: Medication: tadalafil (CIALIS) 20 MG tablet  Has the patient contacted their pharmacy? no  Preferred Pharmacy (with phone number [facility] street name[id number] [place], [place] - [address] [phone] ([phone] (Fax)  Agent: Please be advised that RX refills may take up to 3 business days. We ask that you follow-up with your pharmacy.

## 2018-05-15 ENCOUNTER — Other Ambulatory Visit: Payer: Self-pay

## 2018-05-15 MED ORDER — TADALAFIL 20 MG PO TABS: 20.0000 mg | ORAL_TABLET | Freq: Every day | ORAL | 0 refills | Status: DC | PRN

## 2018-06-13 ENCOUNTER — Other Ambulatory Visit: Payer: Self-pay

## 2018-06-13 MED ORDER — TELMISARTAN 80 MG PO TABS: 80.0000 mg | ORAL_TABLET | Freq: Every day | ORAL | 3 refills | Status: DC

## 2018-06-13 MED ORDER — AMLODIPINE BESYLATE 5 MG PO TABS: 5.0000 mg | ORAL_TABLET | Freq: Every day | ORAL | 3 refills | Status: DC

## 2018-06-17 ENCOUNTER — Ambulatory Visit (INDEPENDENT_AMBULATORY_CARE_PROVIDER_SITE_OTHER): Payer: Medicare HMO | Admitting: Internal Medicine

## 2018-06-17 ENCOUNTER — Encounter: Payer: Self-pay | Admitting: Internal Medicine

## 2018-06-17 DIAGNOSIS — K219 Gastro-esophageal reflux disease without esophagitis: Secondary | ICD-10-CM | POA: Diagnosis not present

## 2018-06-17 DIAGNOSIS — R1310 Dysphagia, unspecified: Secondary | ICD-10-CM | POA: Diagnosis not present

## 2018-06-17 DIAGNOSIS — R0789 Other chest pain: Secondary | ICD-10-CM | POA: Diagnosis not present

## 2018-06-17 MED ORDER — PANTOPRAZOLE SODIUM 40 MG PO TBEC: 40.0000 mg | DELAYED_RELEASE_TABLET | Freq: Every day | ORAL | 3 refills | Status: DC

## 2018-06-17 NOTE — Patient Instructions (Signed)

## 2018-06-17 NOTE — Progress Notes (Signed)
Subjective:  Patient ID: Jared Roth, male    DOB: 1950-10-05  Age: 67 y.o. MRN: [id number]  CC: No chief complaint on file.   HPI Jared Roth presents for fluid sensation in the esophagus and chest w/SOB at night mostly off and on - a "wave of water in the throat" 1-3 per night 30 seconds long... He never got to start Prevacid. Hard to swallow food at times - ie chicken. No n/v. No wt loss... F/u HTN  Outpatient Medications Prior to Visit  Medication Sig Dispense Refill  . amLODipine (NORVASC) 5 MG tablet Take 1 tablet (5 mg total) by mouth daily. 90 tablet 3  . b complex vitamins tablet Take 1 tablet by mouth daily. 100 tablet 3  . Cholecalciferol (VITAMIN D3) 2000 units capsule Take 1 capsule (2,000 Units total) by mouth daily. 100 capsule 3  . famciclovir (FAMVIR) 250 MG tablet TAKE 1 TABLET BY MOUTH THREE TIMES A DAY 21 tablet 1  . lansoprazole (PREVACID) 30 MG capsule Take 1 capsule (30 mg total) by mouth daily at 12 noon. 30 capsule 5  . loratadine (CLARITIN) 10 MG tablet Take 1 tablet (10 mg total) by mouth daily. 100 tablet 3  . simvastatin (ZOCOR) 40 MG tablet Take 1 tablet (40 mg total) by mouth at bedtime. Annual appt due in April must see provider for future refills 90 tablet 0  . tadalafil (CIALIS) 20 MG tablet Take 1 tablet (20 mg total) by mouth daily as needed for erectile dysfunction. 30 tablet 0  . telmisartan (MICARDIS) 80 MG tablet Take 1 tablet (80 mg total) by mouth daily. 90 tablet 3   No facility-administered medications prior to visit.     ROS: Review of Systems  Constitutional: Negative for appetite change, fatigue and unexpected weight change.  HENT: Negative for congestion, nosebleeds, sneezing, sore throat and trouble swallowing.   Eyes: Negative for itching and visual disturbance.  Respiratory: Negative for cough.   Cardiovascular: Positive for chest pain. Negative for palpitations and leg swelling.  Gastrointestinal: Negative for abdominal  distention, blood in stool, diarrhea and nausea.  Genitourinary: Negative for frequency and hematuria.  Musculoskeletal: Negative for back pain, gait problem, joint swelling and neck pain.  Skin: Negative for rash.  Neurological: Negative for dizziness, tremors, speech difficulty and weakness.  Psychiatric/Behavioral: Negative for agitation, dysphoric mood and sleep disturbance. The patient is not nervous/anxious.     Objective:  BP 136/82 (BP Location: Left Arm, Patient Position: Sitting, Cuff Size: Normal)   Pulse 60   Temp 98.4 F (36.9 C) (Oral)   Ht 5\' 6"  (1.676 m)   Wt 173 lb (78.5 kg)   SpO2 99%   BMI 27.92 kg/m   BP Readings from Last 3 Encounters:  06/17/18 136/82  03/12/18 132/78  12/18/17 (!) 142/84    Wt Readings from Last 3 Encounters:  06/17/18 173 lb (78.5 kg)  03/12/18 172 lb (78 kg)  12/18/17 175 lb (79.4 kg)    Physical Exam  Constitutional: He is oriented to person, place, and time. He appears well-developed. No distress.  NAD  HENT:  Mouth/Throat: Oropharynx is clear and moist.  Eyes: Pupils are equal, round, and reactive to light. Conjunctivae are normal.  Neck: Normal range of motion. No JVD present. No thyromegaly present.  Cardiovascular: Normal rate, regular rhythm, normal heart sounds and intact distal pulses. Exam reveals no gallop and no friction rub.  No murmur heard. Pulmonary/Chest: Effort normal and breath sounds normal. No  respiratory distress. He has no wheezes. He has no rales. He exhibits no tenderness.  Abdominal: Soft. Bowel sounds are normal. He exhibits no distension and no mass. There is no tenderness. There is no rebound and no guarding.  Musculoskeletal: Normal range of motion. He exhibits no edema or tenderness.  Lymphadenopathy:    He has no cervical adenopathy.  Neurological: He is alert and oriented to person, place, and time. He has normal reflexes. No cranial nerve deficit. He exhibits normal muscle tone. He displays a  negative Romberg sign. Coordination and gait normal.  Skin: Skin is warm and dry. No rash noted.  Psychiatric: He has a normal mood and affect. His behavior is normal. Judgment and thought content normal.    Lab Results  Component Value Date   WBC 4.4 12/18/2017   HGB 13.0 12/18/2017   HCT 38.2 (L) 12/18/2017   PLT 299.0 12/18/2017   GLUCOSE 103 (H) 12/18/2017   CHOL 190 12/18/2017   TRIG 107.0 12/18/2017   HDL 63.90 12/18/2017   LDLDIRECT 172.5 09/02/2006   LDLCALC 105 (H) 12/18/2017   ALT 20 12/18/2017   AST 23 12/18/2017   NA 138 12/18/2017   K 4.3 12/18/2017   CL 101 12/18/2017   CREATININE 0.95 12/18/2017   BUN 10 12/18/2017   CO2 30 12/18/2017   TSH 0.79 12/18/2017   PSA 2.67 12/18/2017    Dg Chest 2 View  Result Date: 03/12/2018 CLINICAL DATA:  Chest discomfort EXAM: CHEST - 2 VIEW COMPARISON:  03/23/2011 FINDINGS: The heart size and mediastinal contours are within normal limits. Both lungs are clear. The visualized skeletal structures are unremarkable. IMPRESSION: No active cardiopulmonary disease. Electronically Signed   By: Elige Ko   On: 03/12/2018 14:56    Assessment & Plan:   There are no diagnoses linked to this encounter.   No orders of the defined types were placed in this encounter.    Follow-up: No follow-ups on file.  Sonda Primes, MD

## 2018-06-17 NOTE — Assessment & Plan Note (Signed)
Prevacid - n/a Start Protonix GI ref 

## 2018-06-17 NOTE — Assessment & Plan Note (Signed)
Prevacid - n/a Start Protonix GI ref

## 2018-06-17 NOTE — Assessment & Plan Note (Addendum)
Prevacid - n/a Start Protonix GI ref CT

## 2018-06-20 DIAGNOSIS — R6882 Decreased libido: Secondary | ICD-10-CM | POA: Insufficient documentation

## 2018-06-20 DIAGNOSIS — N529 Male erectile dysfunction, unspecified: Secondary | ICD-10-CM | POA: Diagnosis not present

## 2018-06-20 DIAGNOSIS — Z125 Encounter for screening for malignant neoplasm of prostate: Secondary | ICD-10-CM | POA: Diagnosis not present

## 2018-07-04 ENCOUNTER — Ambulatory Visit
Admission: RE | Admit: 2018-07-04 | Discharge: 2018-07-04 | Disposition: A | Discharge status: Home / Self Care | Payer: Self-pay | Source: Ambulatory Visit | Attending: Internal Medicine | Admitting: Internal Medicine

## 2018-07-04 DIAGNOSIS — R0789 Other chest pain: Secondary | ICD-10-CM

## 2018-07-04 DIAGNOSIS — R1310 Dysphagia, unspecified: Secondary | ICD-10-CM

## 2018-07-07 ENCOUNTER — Other Ambulatory Visit: Payer: Self-pay | Admitting: Internal Medicine

## 2018-07-07 MED ORDER — ASPIRIN EC 81 MG PO TBEC: 81.0000 mg | DELAYED_RELEASE_TABLET | Freq: Every day | ORAL | 3 refills | Status: AC

## 2018-07-07 MED ORDER — SIMVASTATIN 40 MG PO TABS: 40.0000 mg | ORAL_TABLET | Freq: Every day | ORAL | 3 refills | Status: DC

## 2018-07-29 ENCOUNTER — Ambulatory Visit: Payer: Self-pay

## 2018-07-29 NOTE — Telephone Encounter (Signed)
Pt stated since Spring, he feels like his throat is closes up, then if he yawns or sits up the symptom goes away. Pt stated as he is recovering, he gets SOB for a "few seconds." Pt stated that he has 4 episodes during the day and zero to 2-3 times per night. Pt stated that the SOB comes and goes only with the throat spasms. Pt stated he read some information on Google and he stated that he thinks he is having laryngospasm's. Pt is scheduled to see GI doctor  (Dr Gwenevere AbbotMixon 08/12/18). Pt is asking for a referral to ENT. Pt advised to call back if worse, worsening SOB or increase in frequency in throat episodes. No availability with Dr Posey ReaPlotnikov until January. Scheduled pt for OV tomorrow at 2:00 pm with Alphonse GuildAshleigh Shambley NP.  Answer Assessment - Initial Assessment Questions 1. RESPIRATORY STATUS: "Describe your breathing?" (e.g., wheezing, shortness of breath, unable to speak, severe coughing)      *No Answer* 2. ONSET: "When did this breathing problem begin?"      Spring 3. PATTERN "Does the difficult breathing come and go, or has it been constant since it started?"      Comes and goes only with the thraot spasms 4. SEVERITY: "How bad is your breathing?" (e.g., mild, moderate, severe)    - MILD: No SOB at rest, mild SOB with walking, speaks normally in sentences, can lay down, no retractions, pulse < 100.    - MODERATE: SOB at rest, SOB with minimal exertion and prefers to sit, cannot lie down flat, speaks in phrases, mild retractions, audible wheezing, pulse 100-120.    - SEVERE: Very SOB at rest, speaks in single words, struggling to breathe, sitting hunched forward, retractions, pulse > 120      After episodes has SON for a few seconds 5. RECURRENT SYMPTOM: "Have you had difficulty breathing before?" If so, ask: "When was the last time?" and "What happened that time?"      Since the Spring 6. CARDIAC HISTORY: "Do you have any history of heart disease?" (e.g., heart attack, angina, bypass surgery,  angioplasty)      no 7. LUNG HISTORY: "Do you have any history of lung disease?"  (e.g., pulmonary embolus, asthma, emphysema)     no 8. CAUSE: "What do you think is causing the breathing problem?"      laryngospasm  9. OTHER SYMPTOMS: "Do you have any other symptoms? (e.g., dizziness, runny nose, cough, chest pain, fever)    no 10. PREGNANCY: "Is there any chance you are pregnant?" "When was your last menstrual period?"       n/a 11. TRAVEL: "Have you traveled out of the country in the last month?" (e.g., travel history, exposures)       no  Protocols used: BREATHING DIFFICULTY-A-AH

## 2018-07-30 ENCOUNTER — Encounter: Payer: Self-pay | Admitting: Nurse Practitioner

## 2018-07-30 ENCOUNTER — Ambulatory Visit (INDEPENDENT_AMBULATORY_CARE_PROVIDER_SITE_OTHER): Payer: Medicare HMO | Admitting: Nurse Practitioner

## 2018-07-30 VITALS — BP 150/98 | HR 65 | Ht 66.0 in | Wt 168.0 lb

## 2018-07-30 DIAGNOSIS — R6889 Other general symptoms and signs: Secondary | ICD-10-CM

## 2018-07-30 DIAGNOSIS — R0989 Other specified symptoms and signs involving the circulatory and respiratory systems: Secondary | ICD-10-CM

## 2018-07-30 DIAGNOSIS — I10 Essential (primary) hypertension: Secondary | ICD-10-CM | POA: Diagnosis not present

## 2018-07-30 MED ORDER — PANTOPRAZOLE SODIUM 40 MG PO TBEC: 40.0000 mg | DELAYED_RELEASE_TABLET | Freq: Every day | ORAL | 3 refills | Status: DC

## 2018-07-30 NOTE — Patient Instructions (Addendum)
Please start protonix 40 daily   Please keep GI appointment  Please follow up with Dr Posey ReaPlotnikov in about 2 weeks for blood pressure recheck.   Gastroesophageal Reflux Disease, Adult Normally, food travels down the esophagus and stays in the stomach to be digested. If a person has gastroesophageal reflux disease (GERD), food and stomach acid move back up into the esophagus. When this happens, the esophagus becomes sore and swollen (inflamed). Over time, GERD can make small holes (ulcers) in the lining of the esophagus. Follow these instructions at home: Diet  Follow a diet as told by your doctor. You may need to avoid foods and drinks such as: ? Coffee and tea (with or without caffeine). ? Drinks that contain alcohol. ? Energy drinks and sports drinks. ? Carbonated drinks or sodas. ? Chocolate and cocoa. ? Peppermint and mint flavorings. ? Garlic and onions. ? Horseradish. ? Spicy and acidic foods, such as peppers, chili powder, curry powder, vinegar, hot sauces, and BBQ sauce. ? Citrus fruit juices and citrus fruits, such as oranges, lemons, and limes. ? Tomato-based foods, such as red sauce, chili, salsa, and pizza with red sauce. ? Fried and fatty foods, such as donuts, french fries, potato chips, and high-fat dressings. ? High-fat meats, such as hot dogs, rib eye steak, sausage, ham, and bacon. ? High-fat dairy items, such as whole milk, butter, and cream cheese.  Eat small meals often. Avoid eating large meals.  Avoid drinking large amounts of liquid with your meals.  Avoid eating meals during the 2-3 hours before bedtime.  Avoid lying down right after you eat.  Do not exercise right after you eat. General instructions  Pay attention to any changes in your symptoms.  Take over-the-counter and prescription medicines only as told by your doctor. Do not take aspirin, ibuprofen, or other NSAIDs unless your doctor says it is okay.  Do not use any tobacco products, including  cigarettes, chewing tobacco, and e-cigarettes. If you need help quitting, ask your doctor.  Wear loose clothes. Do not wear anything tight around your waist.  Raise (elevate) the head of your bed about 6 inches (15 cm).  Try to lower your stress. If you need help doing this, ask your doctor.  If you are overweight, lose an amount of weight that is healthy for you. Ask your doctor about a safe weight loss goal.  Keep all follow-up visits as told by your doctor. This is important. Contact a doctor if:  You have new symptoms.  You lose weight and you do not know why it is happening.  You have trouble swallowing, or it hurts to swallow.  You have wheezing or a cough that keeps happening.  Your symptoms do not get better with treatment.  You have a hoarse voice. Get help right away if:  You have pain in your arms, neck, jaw, teeth, or back.  You feel sweaty, dizzy, or light-headed.  You have chest pain or shortness of breath.  You throw up (vomit) and your throw up looks like blood or coffee grounds.  You pass out (faint).  Your poop (stool) is bloody or black.  You cannot swallow, drink, or eat. This information is not intended to replace advice given to you by your health care provider. Make sure you discuss any questions you have with your health care provider. Document Released: 01/30/2008 Document Revised: 01/19/2016 Document Reviewed: 12/08/2014 Elsevier Interactive Patient Education  [employer]2018 [employer]

## 2018-07-30 NOTE — Progress Notes (Signed)
Jared Roth is a 67 y.o. male with the following history as recorded in [employer]:  Patient Active Problem List   Diagnosis Date Noted  . Dysphagia 06/17/2018  . Chest pain, atypical 03/12/2018  . Tinnitus 12/18/2017  . Low testosterone 12/17/2015  . Well adult exam 12/08/2014  . BPH (benign prostatic hyperplasia) 06/08/2014  . Elevated PSA 12/07/2013  . Erectile dysfunction 12/07/2013  . 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 GERD (gastroesophageal reflux disease) 11/22/2011  . Essential hypertension 01/05/2009  . 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 HYPERCHOLESTEROLEMIA 10/21/2007    Current Outpatient Medications  Medication Sig Dispense Refill  . amLODipine (NORVASC) 5 MG tablet Take 1 tablet (5 mg total) by mouth daily. 90 tablet 3  . aspirin EC 81 MG tablet Take 1 tablet (81 mg total) by mouth daily. 100 tablet 3  . b complex vitamins tablet Take 1 tablet by mouth daily. 100 tablet 3  . Cholecalciferol (VITAMIN D3) 2000 units capsule Take 1 capsule (2,000 Units total) by mouth daily. 100 capsule 3  . famciclovir (FAMVIR) 250 MG tablet TAKE 1 TABLET BY MOUTH THREE TIMES A DAY 21 tablet 1  . loratadine (CLARITIN) 10 MG tablet Take 1 tablet (10 mg total) by mouth daily. 100 tablet 3  . pantoprazole (PROTONIX) 40 MG tablet Take 1 tablet (40 mg total) by mouth daily. 30 tablet 3  . simvastatin (ZOCOR) 40 MG tablet Take 1 tablet (40 mg total) by mouth at bedtime. Annual appt due in April must see provider for future refills 90 tablet 3  . tadalafil (CIALIS) 20 MG tablet Take 1 tablet (20 mg total) by mouth daily as needed for erectile dysfunction. 30 tablet 0  . telmisartan (MICARDIS) 80 MG tablet Take 1 tablet (80 mg total) by mouth daily. 90 tablet 3   No current facility-administered medications for this visit.     Allergies: Atorvastatin and Sulfonamide derivatives  Past Medical History:  Diagnosis Date  . Benign prostatic hypertrophy   . HSV infection   . Hypercholesteremia   . Hypertension      History reviewed. No pertinent surgical history.  Family History  Problem Relation Age of Onset  . Cancer Brother        lymphoma or leukemia  . Heart disease Sister 66       sudden death    Social History   Tobacco Use  . Smoking status: Never Smoker  . Smokeless tobacco: Never Used  Substance Use Topics  . Alcohol use: Yes    Comment: social use     Subjective:   Jared Roth is here today requesting evaluation of acute complaint of "throat problem." This first began early this year, in the spring, occurring about 3 times a day or more since, occurring every day. He describes the spasms as "buildup in my esophagus, then I cant breathe for a few seconds.' he reports the episodes occur randomly, not seeming to be related to any certain activity. He saw PCP for this earlier this year, and was given protonix, referred to GI with upcoming appointment scheduled on 08/12/18, he has been taking the protonix some but not daily He reports trouble swallowing certain food. He denies fevers, chills, weakness, syncope, heartburn, acid regurgitation, chest pain, shortness of breath, cough, abdominal pain, nausea, vomiting  His BP is slightly elevated today, reports daily complaince to blood pressure medications, does not reguarly check BP readings at home, does feel stressed today and feels that may be why his BP is elevated  BP Readings from Last 3 Encounters:  07/30/18 (!) 150/98  06/17/18 136/82  03/12/18 132/78    ROS- See HPI  Objective:  Vitals:   07/30/18 1348 07/30/18 1434  BP: (!) 160/100 (!) 150/98  Pulse: 65   SpO2: 98%   Weight: 168 lb (76.2 kg)   Height: 5\' 6"  (1.676 m)     General: Well developed, well nourished, in no acute distress  Skin : Warm and dry.  Head: Normocephalic and atraumatic  Eyes: Sclera and conjunctiva clear; pupils round and reactive to light; extraocular movements intact  Oropharynx: Pink, supple. No suspicious lesions  Neck: Supple without  thyromegaly, adenopathy  Lungs: Respirations unlabored; clear to auscultation bilaterally without wheeze, rales, rhonchi  CVS exam: normal rate and regular rhythm, S1 and S2 normal.  Extremities: No edema, cyanosis Vessels: Symmetric bilaterally  Neurologic: Alert and oriented; speech intact; face symmetrical; moves all extremities well; CNII-XII intact without focal deficit  Psychiatric: Normal mood and affect.  Assessment:  1. Throat tightness   2. Essential hypertension     Plan:   Throat tightness 03/12/18 EKG and CXR unremarkable Could be related to GERD, we discussed protonix dosing, instructed to take protonix daily for full effect He will keep GI follow up as planned Consider further testing and treatment if GI evaluation is normal and symptoms persist Home management, red flags and return precautions including when to seek immediate care discussed and printed on AVS - pantoprazole (PROTONIX) 40 MG tablet; Take 1 tablet (40 mg total) by mouth daily.  Dispense: 30 tablet; Refill: 3  Return in about 2 weeks (around 08/13/2018) for F/U- hypertension.  No orders of the defined types were placed in this encounter.   Requested Prescriptions   Signed Prescriptions Disp Refills  . pantoprazole (PROTONIX) 40 MG tablet 30 tablet 3    Sig: Take 1 tablet (40 mg total) by mouth daily.    Discussed this case with Dr Posey Reaplotnikov my supervising phys

## 2018-07-31 NOTE — Assessment & Plan Note (Signed)
Here for acute problem today Continue current medications RTC in 2 weeks for F/U - blood pressure recheck

## 2018-08-12 ENCOUNTER — Ambulatory Visit (INDEPENDENT_AMBULATORY_CARE_PROVIDER_SITE_OTHER): Payer: Medicare HMO | Admitting: Internal Medicine

## 2018-08-12 ENCOUNTER — Encounter: Payer: Self-pay | Admitting: Internal Medicine

## 2018-08-12 DIAGNOSIS — R1310 Dysphagia, unspecified: Secondary | ICD-10-CM | POA: Diagnosis not present

## 2018-08-12 DIAGNOSIS — K219 Gastro-esophageal reflux disease without esophagitis: Secondary | ICD-10-CM | POA: Diagnosis not present

## 2018-08-12 DIAGNOSIS — R6881 Early satiety: Secondary | ICD-10-CM | POA: Diagnosis not present

## 2018-08-12 DIAGNOSIS — R0600 Dyspnea, unspecified: Secondary | ICD-10-CM | POA: Diagnosis not present

## 2018-08-12 DIAGNOSIS — F439 Reaction to severe stress, unspecified: Secondary | ICD-10-CM

## 2018-08-12 DIAGNOSIS — R079 Chest pain, unspecified: Secondary | ICD-10-CM | POA: Diagnosis not present

## 2018-08-12 DIAGNOSIS — I10 Essential (primary) hypertension: Secondary | ICD-10-CM

## 2018-08-12 MED ORDER — LORAZEPAM 0.5 MG PO TABS: 0.5000 mg | ORAL_TABLET | Freq: Two times a day (BID) | ORAL | 1 refills | Status: DC | PRN

## 2018-08-12 NOTE — Progress Notes (Signed)
Subjective:  Patient ID: Jared Roth, male    DOB: 02-May-1951  Age: 67 y.o. MRN: [id number]  CC: No chief complaint on file.   HPI Jared Roth presents for his " throat sensation attacks" - "a build -up" that would I"nhibit your breathing". There is a "heart skipping beats" sensation following. The whole attack would last for a few seconds. No CP. No LOC. On a PPI qd C/o ?GERD sx's. He has to drink water slowly, otherwise it would not go down. No solid food dysphagia. He thinks it is a laryngospasm he watched a youtube video.Marland Kitchen. Lost wt - -- avoiding eating due to "spasms". C/o night time sx's too C/o stress at home...  Outpatient Medications Prior to Visit  Medication Sig Dispense Refill  . amLODipine (NORVASC) 5 MG tablet Take 1 tablet (5 mg total) by mouth daily. 90 tablet 3  . aspirin EC 81 MG tablet Take 1 tablet (81 mg total) by mouth daily. 100 tablet 3  . b complex vitamins tablet Take 1 tablet by mouth daily. 100 tablet 3  . Cholecalciferol (VITAMIN D3) 2000 units capsule Take 1 capsule (2,000 Units total) by mouth daily. 100 capsule 3  . famciclovir (FAMVIR) 250 MG tablet TAKE 1 TABLET BY MOUTH THREE TIMES A DAY 21 tablet 1  . loratadine (CLARITIN) 10 MG tablet Take 1 tablet (10 mg total) by mouth daily. 100 tablet 3  . pantoprazole (PROTONIX) 40 MG tablet Take 1 tablet (40 mg total) by mouth daily. 30 tablet 3  . simvastatin (ZOCOR) 40 MG tablet Take 1 tablet (40 mg total) by mouth at bedtime. Annual appt due in April must see provider for future refills 90 tablet 3  . tadalafil (CIALIS) 20 MG tablet Take 1 tablet (20 mg total) by mouth daily as needed for erectile dysfunction. 30 tablet 0  . telmisartan (MICARDIS) 80 MG tablet Take 1 tablet (80 mg total) by mouth daily. 90 tablet 3   No facility-administered medications prior to visit.     ROS: Review of Systems  Constitutional: Positive for unexpected weight change. Negative for appetite change and fatigue.  HENT:  Negative for congestion, nosebleeds, sneezing, sore throat, trouble swallowing and voice change.   Eyes: Negative for itching and visual disturbance.  Respiratory: Negative for cough, wheezing and stridor.   Cardiovascular: Negative for chest pain, palpitations and leg swelling.  Gastrointestinal: Negative for abdominal distention, abdominal pain, blood in stool, diarrhea, nausea and vomiting.  Genitourinary: Negative for frequency and hematuria.  Musculoskeletal: Negative for back pain, gait problem, joint swelling and neck pain.  Skin: Negative for rash.  Neurological: Negative for dizziness, tremors, speech difficulty and weakness.  Psychiatric/Behavioral: Negative for agitation, dysphoric mood and sleep disturbance. The patient is not nervous/anxious.     Objective:  BP (!) 142/88 (BP Location: Left Arm, Patient Position: Sitting, Cuff Size: Normal)   Pulse 65   Temp 98.7 F (37.1 C) (Oral)   Ht 5\' 6"  (1.676 m)   Wt 165 lb (74.8 kg)   SpO2 99%   BMI 26.63 kg/m   BP Readings from Last 3 Encounters:  08/12/18 (!) 142/88  07/30/18 (!) 150/98  06/17/18 136/82    Wt Readings from Last 3 Encounters:  08/12/18 165 lb (74.8 kg)  07/30/18 168 lb (76.2 kg)  06/17/18 173 lb (78.5 kg)    Physical Exam Constitutional:      General: He is not in acute distress.    Appearance: He is well-developed.  Comments: NAD  Eyes:     Conjunctiva/sclera: Conjunctivae normal.     Pupils: Pupils are equal, round, and reactive to light.  Neck:     Musculoskeletal: Normal range of motion.     Thyroid: No thyromegaly.     Vascular: No JVD.  Cardiovascular:     Rate and Rhythm: Normal rate and regular rhythm.     Heart sounds: Normal heart sounds. No murmur. No friction rub. No gallop.   Pulmonary:     Effort: Pulmonary effort is normal. No respiratory distress.     Breath sounds: Normal breath sounds. No wheezing or rales.  Chest:     Chest wall: No tenderness.  Abdominal:      General: Bowel sounds are normal. There is no distension.     Palpations: Abdomen is soft. There is no mass.     Tenderness: There is no abdominal tenderness. There is no guarding or rebound.  Musculoskeletal: Normal range of motion.        General: No tenderness.  Lymphadenopathy:     Cervical: No cervical adenopathy.  Skin:    General: Skin is warm and dry.     Findings: No rash.  Neurological:     Mental Status: He is alert and oriented to person, place, and time.     Cranial Nerves: No cranial nerve deficit.     Motor: No abnormal muscle tone.     Coordination: Coordination normal.     Gait: Gait normal.     Deep Tendon Reflexes: Reflexes are normal and symmetric.  Psychiatric:        Behavior: Behavior normal.        Thought Content: Thought content normal.        Judgment: Judgment normal.     Lab Results  Component Value Date   WBC 4.4 12/18/2017   HGB 13.0 12/18/2017   HCT 38.2 (L) 12/18/2017   PLT 299.0 12/18/2017   GLUCOSE 103 (H) 12/18/2017   CHOL 190 12/18/2017   TRIG 107.0 12/18/2017   HDL 63.90 12/18/2017   LDLDIRECT 172.5 09/02/2006   LDLCALC 105 (H) 12/18/2017   ALT 20 12/18/2017   AST 23 12/18/2017   NA 138 12/18/2017   K 4.3 12/18/2017   CL 101 12/18/2017   CREATININE 0.95 12/18/2017   BUN 10 12/18/2017   CO2 30 12/18/2017   TSH 0.79 12/18/2017   PSA 2.67 12/18/2017    Ct Cardiac Scoring  Addendum Date: 07/04/2018   ADDENDUM REPORT: 07/04/2018 14:11 CLINICAL DATA:  Risk stratification EXAM: Coronary Calcium Score TECHNIQUE: The patient was scanned on a Siemens Somatom 64 slice scanner. Axial non-contrast 3 mm slices were carried out through the heart. The data set was analyzed on a dedicated work station and scored using the Agatson method. FINDINGS: Non-cardiac: See separate report from [facility]. Ascending aorta: Normal diameter 3.7 cm Pericardium: Normal Coronary arteries: Mild calcium noted in all 3 major epicardial coronary arteries  IMPRESSION: Coronary calcium score of 81. This was 68 st percentile for age and sex matched control. Charlton Haws Electronically Signed   By: Charlton Haws M.D.   On: 07/04/2018 14:11   Result Date: 07/04/2018 EXAM: OVER-READ INTERPRETATION CT CHEST The following report is an over-read performed by radiologist Dr. Hulan Saas of [facility] on 07/04/2018. This over-read does not include interpretation of cardiac or coronary anatomy or pathology. The coronary calcium score interpretation by the cardiologist is attached. COMPARISON:  None. FINDINGS: Vascular: No visible aortic  atherosclerosis. No evidence of thoracic aortic aneurysm, maximum diameter ascending thoracic aorta approximately 3.8 cm. Mediastinum/Nodes: No pathologic lymphadenopathy within the visualized mediastinum. Visualized esophagus normal in appearance. Lungs/Pleura: Visualized lung parenchyma clear. Central bronchi patent without significant bronchial wall thickening. No pleural effusions. Upper Abdomen: Visualized upper abdomen unremarkable for the unenhanced technique Musculoskeletal: Visualized skeleton unremarkable. IMPRESSION: No extracardiac findings. Electronically Signed: By: Hulan Saas M.D. On: 07/04/2018 13:10    Assessment & Plan:   There are no diagnoses linked to this encounter.   No orders of the defined types were placed in this encounter.    Follow-up: No follow-ups on file.  Sonda Primes, MD

## 2018-08-12 NOTE — Patient Instructions (Addendum)
Valerian root for anxiety 

## 2018-08-12 NOTE — Assessment & Plan Note (Signed)
Micardis, Norvasc BP ok at home 

## 2018-08-12 NOTE — Assessment & Plan Note (Signed)
Discussed :" throat sensation attacks" - "a build -up" that would I"nhibit your breathing". There is a "heart skipping beats" sensation following. The whole attack would last for a few seconds. No CP. No LOC. On a PPI qd C/o ?GERD sx's. He has to drink water slowly, otherwise it would not go down. No solid food dysphagia. He thinks it is a laryngospasm he watched a youtube video..  GI appt today He may need to see ENT, ?Cardiology Cont PPI Stress discussed

## 2018-08-12 NOTE — Assessment & Plan Note (Signed)
In-laws moved in (in their 90's) Anxiety discussed Lorazepam prn to try  Potential benefits of a long term benzodiazepines  use as well as potential risks  and complications were explained to the patient and were aknowledged.

## 2018-08-12 NOTE — Assessment & Plan Note (Signed)
"   throat sensation attacks" - "a build -up" that would I"nhibit your breathing". There is a "heart skipping beats" sensation following. The whole attack would last for a few seconds. No CP. No LOC. On a PPI qd C/o ?GERD sx's. He has to drink water slowly, otherwise it would not go down. No solid food dysphagia. He thinks it is a laryngospasm he watched a youtube video..  GI appt today He may need to see ENT, ?Cardiology Cont PPI Stress discussed  Lost wt - -- avoiding eating due to "spasms". C/o night time sx's too

## 2018-08-13 DIAGNOSIS — R1013 Epigastric pain: Secondary | ICD-10-CM | POA: Diagnosis not present

## 2018-09-09 ENCOUNTER — Ambulatory Visit: Payer: Medicare HMO | Admitting: Internal Medicine

## 2018-09-12 DIAGNOSIS — R0602 Shortness of breath: Secondary | ICD-10-CM | POA: Diagnosis not present

## 2018-09-19 ENCOUNTER — Telehealth: Payer: Self-pay

## 2018-09-19 NOTE — Telephone Encounter (Signed)
Copied from CRM [id number]. Topic: Referral - Request for Referral >> Sep 19, 2018  1:28 PM Jaquita Rector A wrote: Has patient seen PCP for this complaint? No. *If NO, is insurance requiring patient see PCP for this issue before PCP can refer them? Referral for which specialty: Endocrinology Preferred provider/office: Dr. Betsey Amen, MD   [id number], [place], [place] [address] Reason for referral: Excessive irritability, lack of appetite

## 2018-09-21 NOTE — Telephone Encounter (Signed)
Is it an error? Endocrinologists don't see pts for these sx's. Thx

## 2018-09-26 DIAGNOSIS — K219 Gastro-esophageal reflux disease without esophagitis: Secondary | ICD-10-CM | POA: Diagnosis not present

## 2018-09-26 DIAGNOSIS — J385 Laryngeal spasm: Secondary | ICD-10-CM | POA: Diagnosis not present

## 2018-09-30 DIAGNOSIS — I10 Essential (primary) hypertension: Secondary | ICD-10-CM | POA: Diagnosis not present

## 2018-09-30 DIAGNOSIS — E785 Hyperlipidemia, unspecified: Secondary | ICD-10-CM | POA: Diagnosis not present

## 2018-09-30 DIAGNOSIS — J385 Laryngeal spasm: Secondary | ICD-10-CM | POA: Diagnosis not present

## 2018-09-30 DIAGNOSIS — Z6825 Body mass index (BMI) 25.0-25.9, adult: Secondary | ICD-10-CM | POA: Diagnosis not present

## 2018-10-01 DIAGNOSIS — E785 Hyperlipidemia, unspecified: Secondary | ICD-10-CM | POA: Insufficient documentation

## 2018-10-01 DIAGNOSIS — J385 Laryngeal spasm: Secondary | ICD-10-CM | POA: Insufficient documentation

## 2018-10-09 NOTE — Telephone Encounter (Signed)
Pt states to disregard referral

## 2018-10-09 NOTE — Telephone Encounter (Signed)
LMTCB

## 2018-10-13 DIAGNOSIS — J385 Laryngeal spasm: Secondary | ICD-10-CM | POA: Diagnosis not present

## 2018-10-15 DIAGNOSIS — N400 Enlarged prostate without lower urinary tract symptoms: Secondary | ICD-10-CM | POA: Diagnosis not present

## 2018-10-15 DIAGNOSIS — E7800 Pure hypercholesterolemia, unspecified: Secondary | ICD-10-CM | POA: Diagnosis not present

## 2018-10-15 DIAGNOSIS — I10 Essential (primary) hypertension: Secondary | ICD-10-CM | POA: Diagnosis not present

## 2018-10-15 DIAGNOSIS — R972 Elevated prostate specific antigen [PSA]: Secondary | ICD-10-CM | POA: Diagnosis not present

## 2018-10-15 DIAGNOSIS — R7989 Other specified abnormal findings of blood chemistry: Secondary | ICD-10-CM | POA: Diagnosis not present

## 2018-10-15 DIAGNOSIS — E785 Hyperlipidemia, unspecified: Secondary | ICD-10-CM | POA: Diagnosis not present

## 2018-12-22 ENCOUNTER — Encounter: Payer: Medicare HMO | Admitting: Internal Medicine

## 2019-01-06 ENCOUNTER — Other Ambulatory Visit: Payer: Self-pay | Admitting: Internal Medicine

## 2019-01-12 ENCOUNTER — Other Ambulatory Visit: Payer: Self-pay

## 2019-01-12 ENCOUNTER — Encounter: Payer: Self-pay | Admitting: Internal Medicine

## 2019-01-12 ENCOUNTER — Ambulatory Visit (INDEPENDENT_AMBULATORY_CARE_PROVIDER_SITE_OTHER): Payer: Medicare HMO | Admitting: Internal Medicine

## 2019-01-12 ENCOUNTER — Other Ambulatory Visit (INDEPENDENT_AMBULATORY_CARE_PROVIDER_SITE_OTHER): Payer: Medicare HMO

## 2019-01-12 VITALS — BP 144/84 | HR 71 | Temp 98.3°F | Ht 66.0 in | Wt 161.0 lb

## 2019-01-12 DIAGNOSIS — N529 Male erectile dysfunction, unspecified: Secondary | ICD-10-CM

## 2019-01-12 DIAGNOSIS — Z0001 Encounter for general adult medical examination with abnormal findings: Secondary | ICD-10-CM | POA: Diagnosis not present

## 2019-01-12 DIAGNOSIS — I2510 Atherosclerotic heart disease of native coronary artery without angina pectoris: Secondary | ICD-10-CM | POA: Diagnosis not present

## 2019-01-12 DIAGNOSIS — Z0000 Encounter for general adult medical examination without abnormal findings: Secondary | ICD-10-CM | POA: Diagnosis not present

## 2019-01-12 DIAGNOSIS — F439 Reaction to severe stress, unspecified: Secondary | ICD-10-CM

## 2019-01-12 DIAGNOSIS — Z125 Encounter for screening for malignant neoplasm of prostate: Secondary | ICD-10-CM | POA: Diagnosis not present

## 2019-01-12 DIAGNOSIS — E7800 Pure hypercholesterolemia, unspecified: Secondary | ICD-10-CM

## 2019-01-12 LAB — LIPID PANEL
Cholesterol: 223 mg/dL — ABNORMAL HIGH (ref 0–200)
HDL: 70.7 mg/dL (ref >39.00)
LDL Cholesterol: 114 mg/dL — ABNORMAL HIGH (ref 0–99)
NonHDL: 151.92
Total CHOL/HDL Ratio: 3
Triglycerides: 190 mg/dL — ABNORMAL HIGH (ref 0.0–149.0)
VLDL: 38 mg/dL (ref 0.0–40.0)

## 2019-01-12 LAB — BASIC METABOLIC PANEL
BUN: 13 mg/dL (ref 6–23)
CO2: 30 mEq/L (ref 19–32)
Calcium: 9.2 mg/dL (ref 8.4–10.5)
Chloride: 100 mEq/L (ref 96–112)
Creatinine, Ser: 0.98 mg/dL (ref 0.40–1.50)
GFR: 92.71 mL/min (ref >60.00)
Glucose, Bld: 93 mg/dL (ref 70–99)
Potassium: 3.9 mEq/L (ref 3.5–5.1)
Sodium: 139 mEq/L (ref 135–145)

## 2019-01-12 LAB — CBC WITH DIFFERENTIAL/PLATELET
Basophils Absolute: 0 10*3/uL (ref 0.0–0.1)
Basophils Relative: 0.7 % (ref 0.0–3.0)
Eosinophils Absolute: 0.1 10*3/uL (ref 0.0–0.7)
Eosinophils Relative: 2.4 % (ref 0.0–5.0)
HCT: 41.2 % (ref 39.0–52.0)
Hemoglobin: 14.1 g/dL (ref 13.0–17.0)
Lymphocytes Relative: 32.5 % (ref 12.0–46.0)
Lymphs Abs: 1.4 10*3/uL (ref 0.7–4.0)
MCHC: 34.1 g/dL (ref 30.0–36.0)
MCV: 96.3 fl (ref 78.0–100.0)
Monocytes Absolute: 0.6 10*3/uL (ref 0.1–1.0)
Monocytes Relative: 13.1 % — ABNORMAL HIGH (ref 3.0–12.0)
Neutro Abs: 2.3 10*3/uL (ref 1.4–7.7)
Neutrophils Relative %: 51.3 % (ref 43.0–77.0)
Platelets: 284 10*3/uL (ref 150.0–400.0)
RBC: 4.28 Mil/uL (ref 4.22–5.81)
RDW: 13.6 % (ref 11.5–15.5)
WBC: 4.4 10*3/uL (ref 4.0–10.5)

## 2019-01-12 LAB — HEPATIC FUNCTION PANEL
ALT: 18 U/L (ref 0–53)
AST: 23 U/L (ref 0–37)
Albumin: 4.2 g/dL (ref 3.5–5.2)
Alkaline Phosphatase: 58 U/L (ref 39–117)
Bilirubin, Direct: 0.1 mg/dL (ref 0.0–0.3)
Total Bilirubin: 0.9 mg/dL (ref 0.2–1.2)
Total Protein: 7.4 g/dL (ref 6.0–8.3)

## 2019-01-12 LAB — URINALYSIS, ROUTINE W REFLEX MICROSCOPIC
Bilirubin Urine: NEGATIVE
Hgb urine dipstick: NEGATIVE
Ketones, ur: NEGATIVE
Nitrite: NEGATIVE
Specific Gravity, Urine: 1.02 (ref 1.000–1.030)
Total Protein, Urine: NEGATIVE
Urine Glucose: NEGATIVE
Urobilinogen, UA: 0.2 (ref 0.0–1.0)
pH: 6.5 (ref 5.0–8.0)

## 2019-01-12 LAB — PSA: PSA: 1.95 ng/mL (ref 0.10–4.00)

## 2019-01-12 LAB — TSH: TSH: 1.26 u[IU]/mL (ref 0.35–4.50)

## 2019-01-12 MED ORDER — TADALAFIL 20 MG PO TABS: 20.0000 mg | ORAL_TABLET | Freq: Every day | ORAL | 3 refills | Status: DC | PRN

## 2019-01-12 NOTE — Progress Notes (Signed)
Subjective:  Patient ID: Jared Roth, male    DOB: March 15, 1951  Age: 68 y.o. MRN: [id number]  CC: No chief complaint on file.   HPI Jared Roth presents for a well exam Laryngospasm is better C/o ED  Outpatient Medications Prior to Visit  Medication Sig Dispense Refill  . amLODipine (NORVASC) 5 MG tablet Take 1 tablet (5 mg total) by mouth daily. 90 tablet 3  . aspirin EC 81 MG tablet Take 1 tablet (81 mg total) by mouth daily. 100 tablet 3  . b complex vitamins tablet Take 1 tablet by mouth daily. 100 tablet 3  . Cholecalciferol (VITAMIN D3) 2000 units capsule Take 1 capsule (2,000 Units total) by mouth daily. 100 capsule 3  . famciclovir (FAMVIR) 250 MG tablet TAKE 1 TABLET BY MOUTH THREE TIMES A DAY 21 tablet 1  . loratadine (CLARITIN) 10 MG tablet Take 1 tablet (10 mg total) by mouth daily. 100 tablet 3  . LORazepam (ATIVAN) 0.5 MG tablet Take 1 tablet (0.5 mg total) by mouth 2 (two) times daily as needed for anxiety. 60 tablet 1  . pantoprazole (PROTONIX) 40 MG tablet Take 1 tablet (40 mg total) by mouth daily. 30 tablet 3  . simvastatin (ZOCOR) 40 MG tablet Take 1 tablet (40 mg total) by mouth at bedtime. Annual appt due in April must see provider for future refills 90 tablet 3  . tadalafil (CIALIS) 20 MG tablet Take 1 tablet (20 mg total) by mouth daily as needed for erectile dysfunction. 30 tablet 0  . telmisartan (MICARDIS) 80 MG tablet TAKE 1 TABLET EVERY DAY 90 tablet 3   No facility-administered medications prior to visit.     ROS: Review of Systems  Constitutional: Negative for appetite change, fatigue and unexpected weight change.  HENT: Negative for congestion, nosebleeds, sneezing, sore throat and trouble swallowing.   Eyes: Negative for itching and visual disturbance.  Respiratory: Negative for cough.   Cardiovascular: Negative for chest pain, palpitations and leg swelling.  Gastrointestinal: Negative for abdominal distention, blood in stool, diarrhea and  nausea.  Genitourinary: Negative for frequency and hematuria.  Musculoskeletal: Negative for back pain, gait problem, joint swelling and neck pain.  Skin: Negative for rash.  Neurological: Negative for dizziness, tremors, speech difficulty and weakness.  Psychiatric/Behavioral: Negative for agitation, dysphoric mood, sleep disturbance and suicidal ideas. The patient is nervous/anxious.     Objective:  BP (!) 144/84 (BP Location: Left Arm, Patient Position: Sitting, Cuff Size: Normal)   Pulse 71   Temp 98.3 F (36.8 C) (Oral)   Ht 5\' 6"  (1.676 m)   Wt 161 lb (73 kg)   SpO2 96%   BMI 25.99 kg/m   BP Readings from Last 3 Encounters:  01/12/19 (!) 144/84  08/12/18 (!) 142/88  07/30/18 (!) 150/98    Wt Readings from Last 3 Encounters:  01/12/19 161 lb (73 kg)  08/12/18 165 lb (74.8 kg)  07/30/18 168 lb (76.2 kg)    Physical Exam Constitutional:      General: He is not in acute distress.    Appearance: He is well-developed.     Comments: NAD  Eyes:     Conjunctiva/sclera: Conjunctivae normal.     Pupils: Pupils are equal, round, and reactive to light.  Neck:     Musculoskeletal: Normal range of motion.     Thyroid: No thyromegaly.     Vascular: No JVD.  Cardiovascular:     Rate and Rhythm: Normal rate and regular  rhythm.     Heart sounds: Normal heart sounds. No murmur. No friction rub. No gallop.   Pulmonary:     Effort: Pulmonary effort is normal. No respiratory distress.     Breath sounds: Normal breath sounds. No wheezing or rales.  Chest:     Chest wall: No tenderness.  Abdominal:     General: Bowel sounds are normal. There is no distension.     Palpations: Abdomen is soft. There is no mass.     Tenderness: There is no abdominal tenderness. There is no guarding or rebound.  Genitourinary:    Rectum: Normal. Guaiac result negative.  Musculoskeletal: Normal range of motion.        General: No tenderness.  Lymphadenopathy:     Cervical: No cervical adenopathy.   Skin:    General: Skin is warm and dry.     Findings: No rash.  Neurological:     Mental Status: He is alert and oriented to person, place, and time.     Cranial Nerves: No cranial nerve deficit.     Motor: No abnormal muscle tone.     Coordination: Coordination normal.     Gait: Gait normal.     Deep Tendon Reflexes: Reflexes are normal and symmetric.  Psychiatric:        Behavior: Behavior normal.        Thought Content: Thought content normal.        Judgment: Judgment normal.   prostate 1+  Lab Results  Component Value Date   WBC 4.4 12/18/2017   HGB 13.0 12/18/2017   HCT 38.2 (L) 12/18/2017   PLT 299.0 12/18/2017   GLUCOSE 103 (H) 12/18/2017   CHOL 190 12/18/2017   TRIG 107.0 12/18/2017   HDL 63.90 12/18/2017   LDLDIRECT 172.5 09/02/2006   LDLCALC 105 (H) 12/18/2017   ALT 20 12/18/2017   AST 23 12/18/2017   NA 138 12/18/2017   K 4.3 12/18/2017   CL 101 12/18/2017   CREATININE 0.95 12/18/2017   BUN 10 12/18/2017   CO2 30 12/18/2017   TSH 0.79 12/18/2017   PSA 2.67 12/18/2017    Ct Cardiac Scoring  Addendum Date: 07/04/2018   ADDENDUM REPORT: 07/04/2018 14:11 CLINICAL DATA:  Risk stratification EXAM: Coronary Calcium Score TECHNIQUE: The patient was scanned on a Siemens Somatom 64 slice scanner. Axial non-contrast 3 mm slices were carried out through the heart. The data set was analyzed on a dedicated work station and scored using the Agatson method. FINDINGS: Non-cardiac: [facility]. Ascending aorta: Normal diameter 3.7 cm Pericardium: Normal Coronary arteries: Mild calcium noted in all 3 major epicardial coronary arteries IMPRESSION: Coronary calcium score of 81. This was 7551 st percentile for age and sex matched control. Charlton HawsPeter Nishan Electronically Signed   By: Charlton HawsPeter  Nishan M.D.   On: 07/04/2018 14:11   Result Date: 07/04/2018 EXAM: OVER-READ INTERPRETATION CT CHEST The following report is an over-read performed by radiologist Dr.  Hulan Saashomas Lawrence of [facility] on 07/04/2018. This over-read does not include interpretation of cardiac or coronary anatomy or pathology. The coronary calcium score interpretation by the cardiologist is attached. COMPARISON:  None. FINDINGS: Vascular: No visible aortic atherosclerosis. No evidence of thoracic aortic aneurysm, maximum diameter ascending thoracic aorta approximately 3.8 cm. Mediastinum/Nodes: No pathologic lymphadenopathy within the visualized mediastinum. Visualized esophagus normal in appearance. Lungs/Pleura: Visualized lung parenchyma clear. Central bronchi patent without significant bronchial wall thickening. No pleural effusions. Upper Abdomen: Visualized upper abdomen unremarkable for  the unenhanced technique Musculoskeletal: Visualized skeleton unremarkable. IMPRESSION: No extracardiac findings. Electronically Signed: By: Hulan Saas M.D. On: 07/04/2018 13:10    Assessment & Plan:   There are no diagnoses linked to this encounter.   No orders of the defined types were placed in this encounter.    Follow-up: No follow-ups on file.  Sonda Primes, MD

## 2019-01-12 NOTE — Assessment & Plan Note (Signed)
CT Ca score 11/19 - Coronary arteries: Mild calcium noted in all 3 major epicardial coronary arteries. Coronary calcium score of 81.   Simvastatin, ASA

## 2019-01-12 NOTE — Assessment & Plan Note (Signed)
Cialis prn 

## 2019-01-12 NOTE — Assessment & Plan Note (Signed)
Better. Discussed 

## 2019-01-12 NOTE — Assessment & Plan Note (Signed)
CT Ca score 11/19 - Coronary arteries: Mild calcium noted in all 3 major epicardial coronary arteries. Coronary calcium score of 81.

## 2019-01-12 NOTE — Assessment & Plan Note (Addendum)

## 2019-01-22 ENCOUNTER — Telehealth: Payer: Self-pay

## 2019-01-22 DIAGNOSIS — Z03818 Encounter for observation for suspected exposure to other biological agents ruled out: Secondary | ICD-10-CM | POA: Diagnosis not present

## 2019-01-22 DIAGNOSIS — I10 Essential (primary) hypertension: Secondary | ICD-10-CM | POA: Diagnosis not present

## 2019-01-22 NOTE — Telephone Encounter (Signed)
Pt went and got tested through [facility]

## 2019-01-22 NOTE — Telephone Encounter (Signed)
Copied from CRM [id number]. 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  They did not wear mask while in the office.  His broker advised him it is company policy he get tested as well.  Pt has no sx, and would like to know what to do.  Please advise.

## 2019-01-22 NOTE — Telephone Encounter (Signed)
Get tested via [facility]- pls give him the #. Anne Shutter yourself until the test result is available. Hydrate well. thx

## 2019-02-26 ENCOUNTER — Other Ambulatory Visit: Payer: Self-pay

## 2019-02-26 MED ORDER — AMLODIPINE BESYLATE 5 MG PO TABS: 5.0000 mg | ORAL_TABLET | Freq: Every day | ORAL | 3 refills | Status: DC

## 2019-02-26 MED ORDER — SIMVASTATIN 40 MG PO TABS: 40.0000 mg | ORAL_TABLET | Freq: Every day | ORAL | 3 refills | Status: DC

## 2019-04-30 ENCOUNTER — Ambulatory Visit (INDEPENDENT_AMBULATORY_CARE_PROVIDER_SITE_OTHER): Payer: Medicare HMO

## 2019-04-30 ENCOUNTER — Other Ambulatory Visit: Payer: Self-pay

## 2019-04-30 DIAGNOSIS — Z23 Encounter for immunization: Secondary | ICD-10-CM

## 2019-06-03 DIAGNOSIS — L03011 Cellulitis of right finger: Secondary | ICD-10-CM | POA: Diagnosis not present

## 2019-06-23 ENCOUNTER — Other Ambulatory Visit: Payer: Self-pay

## 2019-06-23 ENCOUNTER — Encounter: Payer: Self-pay | Admitting: Internal Medicine

## 2019-06-23 ENCOUNTER — Other Ambulatory Visit (INDEPENDENT_AMBULATORY_CARE_PROVIDER_SITE_OTHER): Payer: Medicare HMO

## 2019-06-23 ENCOUNTER — Ambulatory Visit (INDEPENDENT_AMBULATORY_CARE_PROVIDER_SITE_OTHER): Payer: Medicare HMO | Admitting: Internal Medicine

## 2019-06-23 VITALS — BP 122/90 | HR 50 | Temp 98.1°F | Ht 66.0 in | Wt 165.0 lb

## 2019-06-23 DIAGNOSIS — R55 Syncope and collapse: Secondary | ICD-10-CM | POA: Insufficient documentation

## 2019-06-23 LAB — CBC
HCT: 39.5 % (ref 39.0–52.0)
Hemoglobin: 13.2 g/dL (ref 13.0–17.0)
MCHC: 33.5 g/dL (ref 30.0–36.0)
MCV: 96.9 fl (ref 78.0–100.0)
Platelets: 255 10*3/uL (ref 150.0–400.0)
RBC: 4.07 Mil/uL — ABNORMAL LOW (ref 4.22–5.81)
RDW: 12.8 % (ref 11.5–15.5)
WBC: 3.7 10*3/uL — ABNORMAL LOW (ref 4.0–10.5)

## 2019-06-23 LAB — COMPREHENSIVE METABOLIC PANEL
ALT: 18 U/L (ref 0–53)
AST: 26 U/L (ref 0–37)
Albumin: 4.2 g/dL (ref 3.5–5.2)
Alkaline Phosphatase: 54 U/L (ref 39–117)
BUN: 12 mg/dL (ref 6–23)
CO2: 30 mEq/L (ref 19–32)
Calcium: 9.3 mg/dL (ref 8.4–10.5)
Chloride: 103 mEq/L (ref 96–112)
Creatinine, Ser: 0.91 mg/dL (ref 0.40–1.50)
GFR: 100.85 mL/min (ref >60.00)
Glucose, Bld: 101 mg/dL — ABNORMAL HIGH (ref 70–99)
Potassium: 3.8 mEq/L (ref 3.5–5.1)
Sodium: 138 mEq/L (ref 135–145)
Total Bilirubin: 0.8 mg/dL (ref 0.2–1.2)
Total Protein: 7.4 g/dL (ref 6.0–8.3)

## 2019-06-23 LAB — LIPID PANEL
Cholesterol: 223 mg/dL — ABNORMAL HIGH (ref 0–200)
HDL: 66.1 mg/dL (ref >39.00)
LDL Cholesterol: 137 mg/dL — ABNORMAL HIGH (ref 0–99)
NonHDL: 156.98
Total CHOL/HDL Ratio: 3
Triglycerides: 101 mg/dL (ref 0.0–149.0)
VLDL: 20.2 mg/dL (ref 0.0–40.0)

## 2019-06-23 NOTE — Patient Instructions (Signed)
Your EKG today is the same as last time. We are checking labs today and depending on results we may want you to see a heart specialist to make sure the heart was not the cause of passing out.

## 2019-06-23 NOTE — Assessment & Plan Note (Addendum)
EKG done in office and unchanged. Checking labs for cause. If no cause found it may be worthwhile to have him see cardiology given past moderate risk CT calcium score and bradycardia. He may need holter monitoring or long term monitor to assess for significant bradycardia. There is some concern with concurrent benzodiazepine usage and alcohol but will allow PCP to manage.

## 2019-06-23 NOTE — Progress Notes (Signed)
   Subjective:   Patient ID: Jared Roth, male    DOB: December 01, 1950, 68 y.o.   MRN: [id number]  HPI The patient is a 68 YO man coming in for syncope about 2 days ago. On Sunday in the afternoon he was eating pistachios and drinking wine and started feeling dizzy. He waited a bit and then stood up and was very dizzy. Sat back down and then got up again with some dizziness. Was walking and passed out. Denies hitting head and states it was only a few seconds but no witness. He landed on the sofa which is why he did not injure himself. He was having a deep sweat right before syncope. Once awoke on couch he was feeling some nauseous and still sweaty. He went to the bed to lie down for awhile and felt some better. Still feeling some dizzy the rest of the day. When he awoke on Monday feeling slightly off but overall well and did normal activities on Monday. Today is feeling normal. Denies recent medication changes, missing medications, change in diet. Denies chest pains or pressure during episode. He has had 2-3 prior episodes of dizziness like this but has not passed out before. Denies abdominal pain. Denies injury from fall. Denies seeking care for this. CT calcium scoring 11/19 with moderate risk and score 81. ECHO stress test 2013 which I cannot review results of directly due to conversion.   PMH, [facility], social history reviewed and updated  Review of Systems  Constitutional: Negative.   HENT: Negative.   Eyes: Negative.   Respiratory: Negative for cough, chest tightness and shortness of breath.   Cardiovascular: Negative for chest pain, palpitations and leg swelling.  Gastrointestinal: Negative for abdominal distention, abdominal pain, constipation, diarrhea, nausea and vomiting.  Musculoskeletal: Negative.   Skin: Negative.   Neurological: Positive for dizziness and syncope.  Psychiatric/Behavioral: Negative.     Objective:  Physical Exam Constitutional:      Appearance: He is well-developed.   HENT:     Head: Normocephalic and atraumatic.  Neck:     Musculoskeletal: Normal range of motion.  Cardiovascular:     Rate and Rhythm: Regular rhythm. Bradycardia present.  Pulmonary:     Effort: Pulmonary effort is normal. No respiratory distress.     Breath sounds: Normal breath sounds. No wheezing or rales.  Abdominal:     General: Bowel sounds are normal. There is no distension.     Palpations: Abdomen is soft.     Tenderness: There is no abdominal tenderness. There is no rebound.  Skin:    General: Skin is warm and dry.  Neurological:     Mental Status: He is alert and oriented to person, place, and time.     Coordination: Coordination normal.     Vitals:   06/23/19 0813 06/23/19 0854  BP: (!) 122/100 122/90  Pulse: (!) 50   Temp: 98.1 F (36.7 C)   TempSrc: Oral   SpO2: 97%   Weight: 165 lb (74.8 kg)   Height: 5\' 6"  (1.676 m)    EKG: Rate 50, axis normal, interval normal, sinus bradycardia, no significant ST or t wave changes, no significant change from 2019  Assessment & Plan:  Visit time 25 minutes: greater than 50% of that time was spent in face to face counseling and coordination of care with the patient: counseled about syncope, causes, potential need for cardiology referral

## 2019-06-24 ENCOUNTER — Other Ambulatory Visit: Payer: Medicare HMO

## 2019-06-24 ENCOUNTER — Telehealth: Payer: Self-pay | Admitting: *Deleted

## 2019-06-24 DIAGNOSIS — R55 Syncope and collapse: Secondary | ICD-10-CM

## 2019-06-24 NOTE — Telephone Encounter (Signed)
Jared Roth in lab called stating the patient's Troponin I (high sensitivity), TSH and Vit b12 labs ordered on 06/23/19 were not performed today after all, due to instrument malfunction. She is requesting to have patient come back in today for re-draw so that specimen can go out to [place] also wanted Dr. Sharlet Roth to be aware that since specimens are going to a outside lab, the (high sensitivity) Troponin can not be done. [facility] does not offer this testing.   I called pt and informed him of the situation. He states he will try to come back today before 5:00 for re-draw. He also states he is feeling some better today.

## 2019-06-25 LAB — TSH
TSH: 1.06 u[IU]/mL (ref 0.35–4.50)
TSH: 0.88 mIU/L (ref 0.40–4.50)

## 2019-06-25 LAB — TROPONIN I: Troponin I: <0.01 ng/mL (ref <=0.0)

## 2019-06-25 LAB — VITAMIN B12
Vitamin B-12: 390 pg/mL (ref 211–911)
Vitamin B-12: 584 pg/mL (ref 200–1100)

## 2019-08-11 ENCOUNTER — Telehealth: Payer: Self-pay | Admitting: Internal Medicine

## 2019-08-11 DIAGNOSIS — H9311 Tinnitus, right ear: Secondary | ICD-10-CM | POA: Diagnosis not present

## 2019-08-11 DIAGNOSIS — H6123 Impacted cerumen, bilateral: Secondary | ICD-10-CM | POA: Diagnosis not present

## 2019-08-11 DIAGNOSIS — R42 Dizziness and giddiness: Secondary | ICD-10-CM | POA: Diagnosis not present

## 2019-08-11 DIAGNOSIS — H903 Sensorineural hearing loss, bilateral: Secondary | ICD-10-CM | POA: Diagnosis not present

## 2019-08-11 NOTE — Telephone Encounter (Signed)
Copied from [place] [id number]. Topic: General - Other >> Aug 11, 2019  2:15 PM Keene Breath wrote: Reason for CRM: Patient called to request a referral to a cardiologist.  CB# [phone]

## 2019-08-11 NOTE — Telephone Encounter (Signed)
Pt scheduled for tomorrow

## 2019-08-12 ENCOUNTER — Encounter: Payer: Self-pay | Admitting: Internal Medicine

## 2019-08-12 ENCOUNTER — Other Ambulatory Visit: Payer: Self-pay | Admitting: Internal Medicine

## 2019-08-12 ENCOUNTER — Other Ambulatory Visit: Payer: Self-pay

## 2019-08-12 ENCOUNTER — Ambulatory Visit (INDEPENDENT_AMBULATORY_CARE_PROVIDER_SITE_OTHER): Payer: Medicare HMO | Admitting: Internal Medicine

## 2019-08-12 DIAGNOSIS — R55 Syncope and collapse: Secondary | ICD-10-CM

## 2019-08-12 DIAGNOSIS — G3281 Cerebellar ataxia in diseases classified elsewhere: Secondary | ICD-10-CM | POA: Diagnosis not present

## 2019-08-12 DIAGNOSIS — I2510 Atherosclerotic heart disease of native coronary artery without angina pectoris: Secondary | ICD-10-CM

## 2019-08-12 MED ORDER — TELMISARTAN 80 MG PO TABS: 40.0000 mg | ORAL_TABLET | Freq: Every day | ORAL | 3 refills | Status: DC

## 2019-08-12 NOTE — Progress Notes (Signed)
Subjective:  Patient ID: Jared Roth, male    DOB: [birth year]  Age: 68 y.o. MRN: [id number]  CC: No chief complaint on file.   HPI Jared Roth presents for lightheaded spells, vertigo, dizziness x 3 since Oct 2020 F/u dyslipidemia, HTN He went to see ENT yesterday - hearing loss  Outpatient Medications Prior to Visit  Medication Sig Dispense Refill  . amLODipine (NORVASC) 5 MG tablet Take 1 tablet (5 mg total) by mouth daily. 90 tablet 3  . b complex vitamins tablet Take 1 tablet by mouth daily. 100 tablet 3  . Cholecalciferol (VITAMIN D3) 2000 units capsule Take 1 capsule (2,000 Units total) by mouth daily. 100 capsule 3  . loratadine (CLARITIN) 10 MG tablet Take 1 tablet (10 mg total) by mouth daily. 100 tablet 3  . LORazepam (ATIVAN) 0.5 MG tablet Take 1 tablet (0.5 mg total) by mouth 2 (two) times daily as needed for anxiety. 60 tablet 1  . pantoprazole (PROTONIX) 40 MG tablet Take 1 tablet (40 mg total) by mouth daily. 30 tablet 3  . simvastatin (ZOCOR) 40 MG tablet Take 1 tablet (40 mg total) by mouth at bedtime. 90 tablet 3  . tadalafil (CIALIS) 20 MG tablet Take 1 tablet (20 mg total) by mouth daily as needed for erectile dysfunction. 30 tablet 3  . telmisartan (MICARDIS) 80 MG tablet TAKE 1 TABLET EVERY DAY 90 tablet 3  . famciclovir (FAMVIR) 250 MG tablet TAKE 1 TABLET BY MOUTH THREE TIMES A DAY 21 tablet 1   No facility-administered medications prior to visit.    ROS: Review of Systems  Constitutional: Negative for appetite change, fatigue and unexpected weight change.  HENT: Negative for congestion, nosebleeds, sneezing, sore throat and trouble swallowing.   Eyes: Negative for itching and visual disturbance.  Respiratory: Negative for cough.   Cardiovascular: Negative for chest pain, palpitations and leg swelling.  Gastrointestinal: Negative for abdominal distention, blood in stool, diarrhea and nausea.  Genitourinary: Negative for frequency and hematuria.    Musculoskeletal: Negative for back pain, gait problem, joint swelling and neck pain.  Skin: Negative for rash.  Neurological: Negative for dizziness, tremors, speech difficulty and weakness.  Psychiatric/Behavioral: Negative for agitation, dysphoric mood and sleep disturbance. The patient is not nervous/anxious.     Objective:  BP 128/84 (BP Location: Left Arm, Patient Position: Sitting, Cuff Size: Normal)   Pulse (!) 50   Temp 97.7 F (36.5 C) (Oral)   Ht 5\' 6"  (1.676 m)   Wt 165 lb (74.8 kg)   SpO2 99%   BMI 26.63 kg/m   BP Readings from Last 3 Encounters:  08/12/19 128/84  06/23/19 122/90  01/12/19 (!) 144/84    Wt Readings from Last 3 Encounters:  08/12/19 165 lb (74.8 kg)  06/23/19 165 lb (74.8 kg)  01/12/19 161 lb (73 kg)    Physical Exam Constitutional:      General: He is not in acute distress.    Appearance: He is well-developed.     Comments: NAD  Eyes:     Conjunctiva/sclera: Conjunctivae normal.     Pupils: Pupils are equal, round, and reactive to light.  Neck:     Thyroid: No thyromegaly.     Vascular: No JVD.  Cardiovascular:     Rate and Rhythm: Normal rate and regular rhythm.     Heart sounds: Normal heart sounds. No murmur. No friction rub. No gallop.   Pulmonary:     Effort: Pulmonary effort is normal.  No respiratory distress.     Breath sounds: Normal breath sounds. No wheezing or rales.  Chest:     Chest wall: No tenderness.  Abdominal:     General: Bowel sounds are normal. There is no distension.     Palpations: Abdomen is soft. There is no mass.     Tenderness: There is no abdominal tenderness. There is no guarding or rebound.  Musculoskeletal:        General: No tenderness. Normal range of motion.     Cervical back: Normal range of motion.  Lymphadenopathy:     Cervical: No cervical adenopathy.  Skin:    General: Skin is warm and dry.     Findings: No rash.  Neurological:     Mental Status: He is alert and oriented to person,  place, and time.     Cranial Nerves: No cranial nerve deficit.     Motor: No abnormal muscle tone.     Coordination: Coordination normal.     Gait: Gait normal.     Deep Tendon Reflexes: Reflexes are normal and symmetric.  Psychiatric:        Behavior: Behavior normal.        Thought Content: Thought content normal.        Judgment: Judgment normal.     Lab Results  Component Value Date   WBC 3.7 (L) 06/23/2019   HGB 13.2 06/23/2019   HCT 39.5 06/23/2019   PLT 255.0 06/23/2019   GLUCOSE 101 (H) 06/23/2019   CHOL 223 (H) 06/23/2019   TRIG 101.0 06/23/2019   HDL 66.10 06/23/2019   LDLDIRECT 172.5 09/02/2006   LDLCALC 137 (H) 06/23/2019   ALT 18 06/23/2019   AST 26 06/23/2019   NA 138 06/23/2019   K 3.8 06/23/2019   CL 103 06/23/2019   CREATININE 0.91 06/23/2019   BUN 12 06/23/2019   CO2 30 06/23/2019   TSH 0.88 06/24/2019   PSA 1.95 01/12/2019    CT CARDIAC SCORING  Addendum Date: 07/04/2018   ADDENDUM REPORT: 07/04/2018 14:11 CLINICAL DATA:  Risk stratification EXAM: Coronary Calcium Score TECHNIQUE: The patient was scanned on a Siemens Somatom 64 slice scanner. Axial non-contrast 3 mm slices were carried out through the heart. The data set was analyzed on a dedicated work station and scored using the [place]. FINDINGS: Non-cardiac: See separate report from [facility]. Ascending aorta: Normal diameter 3.7 cm Pericardium: Normal Coronary arteries: Mild calcium noted in all 3 major epicardial coronary arteries IMPRESSION: Coronary calcium score of 81. This was 27 st percentile for age and sex matched control. Jenkins Rouge Electronically Signed   By: Jenkins Rouge M.D.   On: 07/04/2018 14:11   Result Date: 07/04/2018 EXAM: OVER-READ INTERPRETATION CT CHEST The following report is an over-read performed by radiologist Dr. Evangeline Dakin of [facility] on 07/04/2018. This over-read does not include interpretation of cardiac or coronary anatomy or  pathology. The coronary calcium score interpretation by the cardiologist is attached. COMPARISON:  None. FINDINGS: Vascular: No visible aortic atherosclerosis. No evidence of thoracic aortic aneurysm, maximum diameter ascending thoracic aorta approximately 3.8 cm. Mediastinum/Nodes: No pathologic lymphadenopathy within the visualized mediastinum. Visualized esophagus normal in appearance. Lungs/Pleura: Visualized lung parenchyma clear. Central bronchi patent without significant bronchial wall thickening. No pleural effusions. Upper Abdomen: Visualized upper abdomen unremarkable for the unenhanced technique Musculoskeletal: Visualized skeleton unremarkable. IMPRESSION: No extracardiac findings. Electronically Signed: By: Evangeline Dakin M.D. On: 07/04/2018 13:10    Assessment & Plan:  Diagnoses and all orders for this visit:  Syncope, unspecified syncope type  Atherosclerosis of native coronary artery of native heart without angina pectoris     No orders of the defined types were placed in this encounter.    Follow-up: No follow-ups on file.  Sonda Primes, MD

## 2019-08-12 NOTE — Assessment & Plan Note (Signed)
Coronary calcium score of 81. This was 70 st percentile for age and sex matched control.

## 2019-08-12 NOTE — Assessment & Plan Note (Addendum)
Near-syncope x 3 -- fall 2020 ?BPV vs other  Cardiology ref Head CT. Consider brain MRI  Start Laruth Bouchard - Daroff exercise several times a day as dirrected.

## 2019-08-12 NOTE — Patient Instructions (Signed)
Benign Positional Vertigo symptoms  ?Start Brandt - Daroff exercise several times a day as dirrected. ? ?

## 2019-08-14 ENCOUNTER — Ambulatory Visit (INDEPENDENT_AMBULATORY_CARE_PROVIDER_SITE_OTHER)
Admission: RE | Admit: 2019-08-14 | Discharge: 2019-08-14 | Disposition: A | Discharge status: Home / Self Care | Payer: Medicare HMO | Source: Ambulatory Visit | Attending: Internal Medicine | Admitting: Internal Medicine

## 2019-08-14 ENCOUNTER — Other Ambulatory Visit: Payer: Self-pay

## 2019-08-14 DIAGNOSIS — R55 Syncope and collapse: Secondary | ICD-10-CM

## 2019-08-14 DIAGNOSIS — G3281 Cerebellar ataxia in diseases classified elsewhere: Secondary | ICD-10-CM | POA: Diagnosis not present

## 2019-08-14 DIAGNOSIS — R42 Dizziness and giddiness: Secondary | ICD-10-CM | POA: Diagnosis not present

## 2019-08-15 ENCOUNTER — Encounter: Payer: Self-pay | Admitting: Internal Medicine

## 2019-08-15 DIAGNOSIS — Z03818 Encounter for observation for suspected exposure to other biological agents ruled out: Secondary | ICD-10-CM | POA: Diagnosis not present

## 2019-08-31 NOTE — Progress Notes (Signed)
Cardiology Office Note:   Date:  09/01/2019  NAME:  Jared Roth    MRN: [id number] DOB:  04-23-1951   PCP:  Tresa Garter, MD  Cardiologist:  Reatha Harps, MD   Referring MD: Tresa Garter, MD   Chief Complaint  Patient presents with  . Loss of Consciousness   History of Present Illness:   Jared Roth is a 69 y.o. male with a hx of hypertension, hyperlipidemia who is being seen today for the evaluation of syncope at the request of Plotnikov, Georgina Quint, MD.  Jared Roth reports starting October he has had 4 episodes of dizziness and lightheadedness while sitting.  He reports the first episode occurred in mid October.  He reports on a Sunday afternoon after drinking a glass of wine he became dizzy and lightheaded.  He reports his symptoms did not subside for 20 minutes.  They resolve with lying down and resting.  He reports no issues with position.  He reports no chest pain or trouble breathing with it.  He also reports no episodes of palpitations.  He states his symptoms just simply resolve without any issues.  He has had 4 other episodes of this.  They can occur at any time.  They seem to occur in the late afternoon or early evening.  He reports no worsening of symptoms with change in position.  He has been diagnosed as having vertigo.  He also has tinnitus which is a 24-hour problem for him.  His blood pressure is significantly elevated today but he reports values from home are in the 120-140 range systolic and 60-80 range diastolic.  He does not exercise but has no issues with activity currently.  He can walk up a flight of stairs without any chest pain.  He does not smoke and consumes alcohol infrequently.  He does report one the episode resulted in a fall.  No clear triggers identified.  The symptoms really are not able to be alleviated by other than time.  Review of laboratory data shows a recent LDL cholesterol 137, TSH 0.88.  He reports he is not diabetic.  I did review his  calcium score from 2019 which showed a calcium score of 81 as described below.  Of note, he has had no episodes for 5 weeks.  He reports he is worked on hydration and other signs and symptoms seem to be improving.  Problem List 1. CAD (CAC score 81, 51st percentile 07/04/2018) 2. HTN 3. HLD (total 223, HDL 66, LDL 137, Tg 101 05/2019)  Past Medical History: Past Medical History:  Diagnosis Date  . Benign prostatic hypertrophy   . HSV infection   . Hypercholesteremia   . Hypertension     Past Surgical History: Past Surgical History:  Procedure Laterality Date  . CYST REMOVAL TRUNK    . ELBOW SURGERY      Current Medications: Current Meds  Medication Sig  . amLODipine (NORVASC) 5 MG tablet Take 1 tablet (5 mg total) by mouth daily.  Marland Kitchen b complex vitamins tablet Take 1 tablet by mouth daily.  . Cholecalciferol (VITAMIN D3) 2000 units capsule Take 1 capsule (2,000 Units total) by mouth daily.  Marland Kitchen loratadine (CLARITIN) 10 MG tablet Take 1 tablet (10 mg total) by mouth daily.  Marland Kitchen LORazepam (ATIVAN) 0.5 MG tablet Take 1 tablet (0.5 mg total) by mouth 2 (two) times daily as needed for anxiety.  . pantoprazole (PROTONIX) 40 MG tablet Take 1 tablet (40 mg total)  by mouth daily.  . tadalafil (CIALIS) 20 MG tablet Take 1 tablet (20 mg total) by mouth daily as needed for erectile dysfunction.  Marland Kitchen. telmisartan (MICARDIS) 80 MG tablet Take 0.5 tablets (40 mg total) by mouth daily.  . [DISCONTINUED] simvastatin (ZOCOR) 40 MG tablet TAKE 1 TAB AT BEDTIME     Allergies:    Atorvastatin and Sulfonamide derivatives   Social History: Social History   Socioeconomic History  . Marital status: Married    Spouse name: ruth  . Number of children: 2  . Years of education: Not on file  . Highest education level: Not on file  Occupational History  . Occupation: retired from [employer]  . Occupation: Equities tradermental health tech in [employer]  . 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 Smoking status: Never Smoker  . Smokeless tobacco: Never Used  Substance and Sexual Activity  . Alcohol use: Yes    Comment: social use  . Drug use: No  . Sexual activity: Yes  Other Topics Concern  . Not on file  Social History Narrative   Pt has a total of 9 siblings---he is the youngest of 8010   Social Determinants of Health   Financial Resource Strain:   . Difficulty of Paying Living Expenses: Not on file  Food Insecurity:   . Worried About Programme researcher, broadcasting/film/videounning Out of Food in the Last Year: Not on file  . Ran Out of Food in the Last Year: Not on file  Transportation Needs:   . Lack of Transportation (Medical): Not on file  . Lack of Transportation (Non-Medical): Not on file  Physical Activity:   . Days of Exercise per Week: Not on file  . Minutes of Exercise per Session: Not on file  Stress:   . Feeling of Stress : Not on file  Social Connections:   . Frequency of Communication with Friends and Family: Not on file  . Frequency of Social Gatherings with Friends and Family: Not on file  . Attends Religious Services: Not on file  . Active Member of Clubs or Organizations: Not on file  . Attends BankerClub or Organization Meetings: Not on file  . Marital Status: Not on file     Family History: The patient's family history includes Cancer in his brother; Heart disease in his father; Heart disease (age of onset: 4474) in his sister; Hypertension in his mother; Stroke in his mother.  ROS:   All other ROS reviewed and negative. Pertinent positives noted in the HPI.     EKGs/Labs/Other Studies Reviewed:   The following studies were personally reviewed by me today:  EKG:  EKG is ordered today.  The ekg ordered today demonstrates normal sinus rhythm, heart rate 60, no acute ST-T changes, no evidence of prior infarction, likely LVH, and was personally reviewed by me.   CAC score 81 06/2018  Recent Labs: 06/23/2019: ALT 18; BUN 12; Creatinine, Ser 0.91; Hemoglobin 13.2; Platelets  255.0; Potassium 3.8; Sodium 138 06/24/2019: TSH 0.88   Recent Lipid Panel    Component Value Date/Time   CHOL 223 (H) 06/23/2019 0921   TRIG 101.0 06/23/2019 0921   TRIG 66 09/02/2006 1033   HDL 66.10 06/23/2019 0921   CHOLHDL 3 06/23/2019 0921   VLDL 20.2 06/23/2019 0921   LDLCALC 137 (H) 06/23/2019 0921   LDLDIRECT 172.5 09/02/2006 1033    Physical Exam:   VS:  BP (!) 174/88   Pulse 60  Ht 5\' 6"  (1.676 m)   Wt 165 lb 3.2 oz (74.9 kg)   SpO2 100%   BMI 26.66 kg/m    Wt Readings from Last 3 Encounters:  09/01/19 165 lb 3.2 oz (74.9 kg)  08/12/19 165 lb (74.8 kg)  06/23/19 165 lb (74.8 kg)    General: Well nourished, well developed, in no acute distress Heart: Atraumatic, normal size  Eyes: PEERLA, EOMI  Neck: Supple, no JVD Endocrine: No thryomegaly Cardiac: Normal S1, S2; RRR; no murmurs, rubs, or gallops Lungs: Clear to auscultation bilaterally, no wheezing, rhonchi or rales  Abd: Soft, nontender, no hepatomegaly  Ext: No edema, pulses 2+ Musculoskeletal: No deformities, BUE and BLE strength normal and equal Skin: Warm and dry, no rashes   Neuro: Alert and oriented to person, place, time, and situation, CNII-XII grossly intact, no focal deficits  Psych: Normal mood and affect   ASSESSMENT:   Jared Roth is a 69 y.o. male who presents for the following: 1. Syncope, unspecified syncope type   2. Coronary artery disease involving native coronary artery of native heart without angina pectoris   3. Mixed hyperlipidemia   4. Essential hypertension     PLAN:   1. Syncope, unspecified syncope type -Unclear etiology.  Could be vertigo versus hypertensive episodes.  He says he had no episodes for 5 weeks as well.  He does have a history of CAD and I do wonder if this is an etiology here.  His EKG is really unremarkable without any acute ischemic changes.  He does have LVH and needs to get better control blood pressure.  We will let him continue his current medications  his BPs at home are more stable. -For now we will obtain echocardiogram as well as a cardiac CTA to exclude any obstructive CAD.  He does have a calcium score of 81 in the past and we need to make sure there is no soft plaque were missing.  He will obtain a BMP 1 week before the study as well as take 50 mg of metoprolol 2 hours before that scan.  He was in agreement that this is unnecessary work-up and this will make him feel better. -He will continue aspirin daily -I will switch him to Crestor 20 mg daily for better LDL control  2. Coronary artery disease involving native coronary artery of native heart without angina pectoris -Calcium score of 81 in 2018 -Would prefer his LDL less than 70 -Continue aspirin -Stop simvastatin and start Crestor 20 mg daily  3. Mixed hyperlipidemia -Crestor 20 mg daily  4. Essential hypertension -Continue current blood pressure medications  Disposition: Return in about 3 months (around 11/30/2019).  Medication Adjustments/Labs and Tests Ordered: Current medicines are reviewed at length with the patient today.  Concerns regarding medicines are outlined above.  Orders Placed This Encounter  Procedures  . CT CORONARY MORPH W/CTA COR W/SCORE W/CA W/CM &/OR WO/CM  . CT CORONARY FRACTIONAL FLOW RESERVE DATA PREP  . CT CORONARY FRACTIONAL FLOW RESERVE FLUID ANALYSIS  . B Nat Peptide  . Basic metabolic panel  . EKG 12-Lead  . ECHOCARDIOGRAM COMPLETE   Meds ordered this encounter  Medications  . metoprolol tartrate (LOPRESSOR) 50 MG tablet    Sig: Take 1 tablet (50 mg total) by mouth once for 1 dose. TO BE TAKEN TWO HOURS PRIOR TO YOUR CARDIAC CT    Dispense:  1 tablet    Refill:  0  . rosuvastatin (CRESTOR) 20 MG tablet  Sig: Take 1 tablet (20 mg total) by mouth daily.    Dispense:  90 tablet    Refill:  3    Patient Instructions  Medication Instructions:  Your physician has recommended you make the following change in your medication:   STOP  TAKING SIMVASTATIN.  START ROSUVASTATIN (CRESTOR) 20 MG BY MOUTH DAILY.  *If you need a refill on your cardiac medications before your next appointment, please call your pharmacy*  Lab Work: Your physician recommends that you return for lab work 1 WEEK PRIOR TO YOUR CARDIAC CT:  B NATRIURETIC PEPTIDE  BASIC METABOLIC PANEL  If you have labs (blood work) drawn today and your tests are completely normal, you will receive your results only by: Marland Kitchen MyChart Message (if you have MyChart) OR . A paper copy in the mail If you have any lab test that is abnormal or we need to change your treatment, we will call you to review the results.  Testing/Procedures: Your physician has requested that you have an echocardiogram. Echocardiography is a painless test that uses sound waves to create images of your heart. It provides your doctor with information about the size and shape of your heart and how well your heart's chambers and valves are working. This procedure takes approximately one hour. There are no restrictions for this procedure. LOCATION: [facility]: [address], [place], [place] [address]   Your physician has requested that you have cardiac CT. Cardiac computed tomography (CT) is a painless test that uses an x-ray machine to take clear, detailed pictures of your heart. For further information please visit [email or web address] Please follow instruction sheet as given.    Follow-Up: At [facility], you and your health needs are our priority.  As part of our continuing mission to provide you with exceptional heart care, we have created designated Provider Care Teams.  These Care Teams include your primary Cardiologist (physician) and Advanced Practice Providers (APPs -  Physician Assistants and Nurse Practitioners) who all work together to provide you with the care you need, when you need it.  Your next appointment:   3 month(s)  The format  for your next appointment:   In Person  Provider:   Lennie Odor, MD  Other Instructions Your cardiac CT will be scheduled at one of the below locations:   [facility] [address] [place], [place] [address] [phone]  OR  [facility] [address] [place], [place] [address] [phone]  If scheduled at [facility], please arrive at the [facility] main entrance of [facility] 30-45 minutes prior to test start time. Proceed to the [facility] Radiology Department (first floor) to check-in and test prep.  If scheduled at [facility], please arrive 15 mins early for check-in and test prep.  Please follow these instructions carefully (unless otherwise directed):  Hold all erectile dysfunction medications at least 3 days (72 hrs) prior to test.  On the Night Before the Test: . Be sure to Drink plenty of water. . Do not consume any caffeinated/decaffeinated beverages or chocolate 12 hours prior to your test. . Do not take any antihistamines 12 hours prior to your test.  On the Day of the Test: . Drink plenty of water. Do not drink any water within one hour of the test. . Do not eat any food 4 hours prior to the test. . You may take  your regular medications prior to the test.  . Take metoprolol (Lopressor) 50 MG two hours prior to test.        After the Test: . Drink plenty of water. . After receiving IV contrast, you may experience a mild flushed feeling. This is normal. . On occasion, you may experience a mild rash up to 24 hours after the test. This is not dangerous. If this occurs, you can take Benadryl 25 mg and increase your fluid intake. . If you experience trouble breathing, this can be serious. If it is severe call 911 IMMEDIATELY. If it is mild, please call our office.   Once we have confirmed authorization from your insurance company, we will call you to  set up a date and time for your test.   For non-scheduling related questions, please contact the cardiac imaging nurse navigator should you have any questions/concerns: Marchia Bond, RN Navigator Cardiac Imaging [facility] [phone] Office        Signed, Addison Naegeli. Audie Box, [place]  [address] [place], [place] [address] [phone]  09/01/2019 1:07 PM

## 2019-09-01 ENCOUNTER — Encounter: Payer: Self-pay | Admitting: Cardiovascular Disease

## 2019-09-01 ENCOUNTER — Other Ambulatory Visit: Payer: Self-pay

## 2019-09-01 ENCOUNTER — Ambulatory Visit: Payer: Medicare HMO | Admitting: Cardiovascular Disease

## 2019-09-01 VITALS — BP 174/88 | HR 60 | Ht 66.0 in | Wt 165.2 lb

## 2019-09-01 DIAGNOSIS — E782 Mixed hyperlipidemia: Secondary | ICD-10-CM

## 2019-09-01 DIAGNOSIS — I10 Essential (primary) hypertension: Secondary | ICD-10-CM

## 2019-09-01 DIAGNOSIS — I2510 Atherosclerotic heart disease of native coronary artery without angina pectoris: Secondary | ICD-10-CM

## 2019-09-01 DIAGNOSIS — R55 Syncope and collapse: Secondary | ICD-10-CM

## 2019-09-01 MED ORDER — ROSUVASTATIN CALCIUM 20 MG PO TABS: 20.0000 mg | ORAL_TABLET | Freq: Every day | ORAL | 3 refills | Status: DC

## 2019-09-01 MED ORDER — METOPROLOL TARTRATE 50 MG PO TABS: 50.0000 mg | ORAL_TABLET | Freq: Once | ORAL | 0 refills | Status: DC

## 2019-09-01 NOTE — Patient Instructions (Addendum)
Medication Instructions:  Your physician has recommended you make the following change in your medication:   STOP TAKING SIMVASTATIN.  START ROSUVASTATIN (CRESTOR) 20 MG BY MOUTH DAILY.  *If you need a refill on your cardiac medications before your next appointment, please call your pharmacy*  Lab Work: Your physician recommends that you return for lab work 1 WEEK PRIOR TO YOUR CARDIAC CT:  B NATRIURETIC PEPTIDE  BASIC METABOLIC PANEL  If you have labs (blood work) drawn today and your tests are completely normal, you will receive your results only by: Marland Kitchen MyChart Message (if you have MyChart) OR . A paper copy in the mail If you have any lab test that is abnormal or we need to change your treatment, we will call you to review the results.  Testing/Procedures: Your physician has requested that you have an echocardiogram. Echocardiography is a painless test that uses sound waves to create images of your heart. It provides your doctor with information about the size and shape of your heart and how well your heart's chambers and valves are working. This procedure takes approximately one hour. There are no restrictions for this procedure. LOCATION:  [facility] [address], [place], [place] [address]   Your physician has requested that you have cardiac CT. Cardiac computed tomography (CT) is a painless test that uses an x-ray machine to take clear, detailed pictures of your heart. For further information please visit [email or web address] Please follow instruction sheet as given.    Follow-Up: At [facility], you and your health needs are our priority.  As part of our continuing mission to provide you with exceptional heart care, we have created designated Provider Care Teams.  These Care Teams include your primary Cardiologist (physician) and Advanced Practice Providers (APPs -  Physician Assistants and Nurse Practitioners) who all work  together to provide you with the care you need, when you need it.  Your next appointment:   3 month(s)  The format for your next appointment:   In Person  Provider:   Lennie Odor, MD  Other Instructions Your cardiac CT will be scheduled at one of the below locations:   [facility] [address] [place], [place] [address] [phone]  OR  [facility] [address] [place], [place] [address] [phone]  If scheduled at [facility], please arrive at the [facility] main entrance of [facility] 30-45 minutes prior to test start time. Proceed to the [facility] Radiology Department (first floor) to check-in and test prep.  If scheduled at [facility], please arrive 15 mins early for check-in and test prep.  Please follow these instructions carefully (unless otherwise directed):  Hold all erectile dysfunction medications at least 3 days (72 hrs) prior to test.  On the Night Before the Test: . Be sure to Drink plenty of water. . Do not consume any caffeinated/decaffeinated beverages or chocolate 12 hours prior to your test. . Do not take any antihistamines 12 hours prior to your test.  On the Day of the Test: . Drink plenty of water. Do not drink any water within one hour of the test. . Do not eat any food 4 hours prior to the test. . You may take your regular medications prior to the test.  . Take metoprolol (Lopressor) 50 MG two hours prior to test.        After the Test: .  Drink plenty of water. . After receiving IV contrast, you may experience a mild flushed feeling. This is normal. . On occasion, you may experience a mild rash up to 24 hours after the test. This is not dangerous. If this occurs, you can take Benadryl 25 mg and increase your fluid intake. . If you experience trouble breathing, this can be serious. If it is severe call 911 IMMEDIATELY. If it is  mild, please call our office.   Once we have confirmed authorization from your insurance company, we will call you to set up a date and time for your test.   For non-scheduling related questions, please contact the cardiac imaging nurse navigator should you have any questions/concerns: Jared Bond, RN Navigator Cardiac Imaging [facility] [phone] Office

## 2019-09-11 ENCOUNTER — Ambulatory Visit (HOSPITAL_COMMUNITY): Payer: Medicare HMO | Attending: Cardiovascular Disease

## 2019-09-11 ENCOUNTER — Other Ambulatory Visit: Payer: Self-pay

## 2019-09-11 DIAGNOSIS — R55 Syncope and collapse: Secondary | ICD-10-CM | POA: Diagnosis not present

## 2019-09-23 ENCOUNTER — Ambulatory Visit (INDEPENDENT_AMBULATORY_CARE_PROVIDER_SITE_OTHER): Payer: Medicare HMO | Admitting: Internal Medicine

## 2019-09-23 ENCOUNTER — Encounter: Payer: Self-pay | Admitting: Internal Medicine

## 2019-09-23 ENCOUNTER — Other Ambulatory Visit: Payer: Self-pay

## 2019-09-23 DIAGNOSIS — I2510 Atherosclerotic heart disease of native coronary artery without angina pectoris: Secondary | ICD-10-CM

## 2019-09-23 DIAGNOSIS — H9319 Tinnitus, unspecified ear: Secondary | ICD-10-CM | POA: Diagnosis not present

## 2019-09-23 DIAGNOSIS — R55 Syncope and collapse: Secondary | ICD-10-CM | POA: Diagnosis not present

## 2019-09-23 NOTE — Assessment & Plan Note (Signed)
Simvastatin - d/c, ASA Crestor

## 2019-09-23 NOTE — Assessment & Plan Note (Signed)
No change Vertigo

## 2019-09-23 NOTE — Progress Notes (Signed)
Subjective:  Patient ID: Jared Roth, male    DOB: 1951/08/03  Age: 69 y.o. MRN: [id number]  CC: No chief complaint on file.   HPI DANNEY BUNGERT presents for vertigo, syncope and coronary disease f/u visit  Outpatient Medications Prior to Visit  Medication Sig Dispense Refill   amLODipine (NORVASC) 5 MG tablet Take 1 tablet (5 mg total) by mouth daily. 90 tablet 3   b complex vitamins tablet Take 1 tablet by mouth daily. 100 tablet 3   Cholecalciferol (VITAMIN D3) 2000 units capsule Take 1 capsule (2,000 Units total) by mouth daily. 100 capsule 3   loratadine (CLARITIN) 10 MG tablet Take 1 tablet (10 mg total) by mouth daily. 100 tablet 3   LORazepam (ATIVAN) 0.5 MG tablet Take 1 tablet (0.5 mg total) by mouth 2 (two) times daily as needed for anxiety. 60 tablet 1   pantoprazole (PROTONIX) 40 MG tablet Take 1 tablet (40 mg total) by mouth daily. 30 tablet 3   rosuvastatin (CRESTOR) 20 MG tablet Take 1 tablet (20 mg total) by mouth daily. 90 tablet 3   tadalafil (CIALIS) 20 MG tablet Take 1 tablet (20 mg total) by mouth daily as needed for erectile dysfunction. 30 tablet 3   telmisartan (MICARDIS) 80 MG tablet Take 0.5 tablets (40 mg total) by mouth daily. 90 tablet 3   metoprolol tartrate (LOPRESSOR) 50 MG tablet Take 1 tablet (50 mg total) by mouth once for 1 dose. TO BE TAKEN TWO HOURS PRIOR TO YOUR CARDIAC CT 1 tablet 0   No facility-administered medications prior to visit.    ROS: Review of Systems  Objective:  BP (!) 146/84 (BP Location: Left Arm, Patient Position: Sitting, Cuff Size: Large)   Pulse (!) 55   Temp 98.1 F (36.7 C) (Oral)   Ht 5\' 6"  (1.676 m)   Wt 165 lb (74.8 kg)   SpO2 97%   BMI 26.63 kg/m   BP Readings from Last 3 Encounters:  09/23/19 (!) 146/84  09/01/19 (!) 174/88  08/12/19 128/84    Wt Readings from Last 3 Encounters:  09/23/19 165 lb (74.8 kg)  09/01/19 165 lb 3.2 oz (74.9 kg)  08/12/19 165 lb (74.8 kg)    Physical Exam  Lab Results   Component Value Date   WBC 3.7 (L) 06/23/2019   HGB 13.2 06/23/2019   HCT 39.5 06/23/2019   PLT 255.0 06/23/2019   GLUCOSE 101 (H) 06/23/2019   CHOL 223 (H) 06/23/2019   TRIG 101.0 06/23/2019   HDL 66.10 06/23/2019   LDLDIRECT 172.5 09/02/2006   LDLCALC 137 (H) 06/23/2019   ALT 18 06/23/2019   AST 26 06/23/2019   NA 138 06/23/2019   K 3.8 06/23/2019   CL 103 06/23/2019   CREATININE 0.91 06/23/2019   BUN 12 06/23/2019   CO2 30 06/23/2019   TSH 0.88 06/24/2019   PSA 1.95 01/12/2019    CT Head Wo Contrast  Result Date: 08/14/2019 CLINICAL DATA:  Ataxia/vertigo with lightheadedness. Near syncope. Possible stroke. 4 episodes of vertigo over the past couple months. Diaphoresis and nausea. EXAM: CT HEAD WITHOUT CONTRAST TECHNIQUE: Contiguous axial images were obtained from the base of the skull through the vertex without intravenous contrast. COMPARISON:  None. FINDINGS: Brain: Ventricles, cisterns and other CSF spaces are normal. There is no mass, mass effect, shift of midline structures or acute hemorrhage. No evidence of acute infarction. Vascular: No hyperdense vessel or unexpected calcification. Skull: Normal. Negative for fracture or focal lesion.  Sinuses/Orbits: No acute finding. Other: None. IMPRESSION: No acute findings. Electronically Signed   By: Marin Olp M.D.   On: 08/14/2019 08:34    Assessment & Plan:      Follow-up: No follow-ups on file.  Walker Kehr, MD

## 2019-09-23 NOTE — Assessment & Plan Note (Signed)
No relapse 

## 2019-10-01 ENCOUNTER — Encounter (HOSPITAL_COMMUNITY): Payer: Self-pay

## 2019-10-01 DIAGNOSIS — E782 Mixed hyperlipidemia: Secondary | ICD-10-CM | POA: Diagnosis not present

## 2019-10-01 DIAGNOSIS — I10 Essential (primary) hypertension: Secondary | ICD-10-CM | POA: Diagnosis not present

## 2019-10-01 DIAGNOSIS — R55 Syncope and collapse: Secondary | ICD-10-CM | POA: Diagnosis not present

## 2019-10-01 DIAGNOSIS — I2510 Atherosclerotic heart disease of native coronary artery without angina pectoris: Secondary | ICD-10-CM | POA: Diagnosis not present

## 2019-10-02 ENCOUNTER — Telehealth (HOSPITAL_COMMUNITY): Payer: Self-pay | Admitting: Emergency Medicine

## 2019-10-02 LAB — BASIC METABOLIC PANEL
BUN/Creatinine Ratio: 10 (ref 10–24)
BUN: 11 mg/dL (ref 8–27)
CO2: 27 mmol/L (ref 20–29)
Calcium: 9.7 mg/dL (ref 8.6–10.2)
Chloride: 103 mmol/L (ref 96–106)
Creatinine, Ser: 1.12 mg/dL (ref 0.76–1.27)
GFR calc Af Amer: 79 mL/min/{1.73_m2} (ref >59)
GFR calc non Af Amer: 68 mL/min/{1.73_m2} (ref >59)
Glucose: 83 mg/dL (ref 65–99)
Potassium: 4.5 mmol/L (ref 3.5–5.2)
Sodium: 142 mmol/L (ref 134–144)

## 2019-10-02 LAB — BRAIN NATRIURETIC PEPTIDE: BNP: 3.2 pg/mL (ref 0.0–100.0)

## 2019-10-02 NOTE — Telephone Encounter (Signed)
Left message on voicemail with name and callback number Azaria Stegman RN Navigator Cardiac Imaging [facility] [phone] Office [phone] Cell  

## 2019-10-05 ENCOUNTER — Encounter: Payer: Medicare HMO | Admitting: *Deleted

## 2019-10-05 ENCOUNTER — Ambulatory Visit (HOSPITAL_COMMUNITY)
Admission: RE | Admit: 2019-10-05 | Discharge: 2019-10-05 | Disposition: A | Discharge status: Home / Self Care | Payer: Medicare HMO | Source: Ambulatory Visit | Attending: Cardiovascular Disease | Admitting: Cardiovascular Disease

## 2019-10-05 ENCOUNTER — Other Ambulatory Visit: Payer: Self-pay

## 2019-10-05 DIAGNOSIS — R55 Syncope and collapse: Secondary | ICD-10-CM

## 2019-10-05 DIAGNOSIS — I2510 Atherosclerotic heart disease of native coronary artery without angina pectoris: Secondary | ICD-10-CM | POA: Diagnosis not present

## 2019-10-05 DIAGNOSIS — Z006 Encounter for examination for normal comparison and control in clinical research program: Secondary | ICD-10-CM

## 2019-10-05 MED ORDER — IOHEXOL 350 MG/ML SOLN
80.0000 mL | Freq: Once | INTRAVENOUS | Status: AC | PRN
  Administered 2019-10-05: 80 mL via INTRAVENOUS

## 2019-10-05 MED ORDER — NITROGLYCERIN 0.4 MG SL SUBL
SUBLINGUAL_TABLET | SUBLINGUAL | Status: AC
  Administered 2019-10-05: 0.8 mg via SUBLINGUAL
  Filled 2019-10-05: qty 2

## 2019-10-05 MED ORDER — NITROGLYCERIN 0.4 MG SL SUBL: 0.8000 mg | SUBLINGUAL_TABLET | Freq: Once | SUBLINGUAL | Status: AC

## 2019-10-05 NOTE — Research (Signed)
CADFEM Informed Consent                  Subject Name:   Jared Roth. Jared Roth   Subject met inclusion and exclusion criteria.  The informed consent form, study requirements and expectations were reviewed with the subject and questions and concerns were addressed prior to the signing of the consent form.  The subject verbalized understanding of the trial requirements.  The subject agreed to participate in the CADFEM trial and signed the informed consent.  The informed consent was obtained prior to performance of any protocol-specific procedures for the subject.  A copy of the signed informed consent was given to the subject and a copy was placed in the subject's medical record.   [place] Alayzia Pavlock, Research Assistant  10/05/2019 10:25 a.m.

## 2019-10-05 NOTE — Progress Notes (Signed)
Patient ID: Jared Roth, male   DOB: [birth year], 69 y.o.   MRN: [id number]   Pt tolerated exam without incident; pt denies lightheadedness or dizziness; pt ambulatory to lobby steady gait noted

## 2019-10-06 ENCOUNTER — Telehealth: Payer: Self-pay | Admitting: Cardiovascular Disease

## 2019-10-06 NOTE — Telephone Encounter (Signed)
Tried to reach Jared Roth. Mild CAD. Would like to increase his crestor to 40 mg QHS and recheck lipids in 6 weeks. Also need to ensure he is on aspirin.   Gerri Spore T. Flora Lipps, MD [facility]  [address] [place], [place] [address] [phone]  11:00 AM

## 2019-10-07 ENCOUNTER — Other Ambulatory Visit: Payer: Self-pay

## 2019-10-07 DIAGNOSIS — E782 Mixed hyperlipidemia: Secondary | ICD-10-CM

## 2019-10-07 MED ORDER — ROSUVASTATIN CALCIUM 40 MG PO TABS: 40.0000 mg | ORAL_TABLET | Freq: Every day | ORAL | 3 refills | Status: DC

## 2019-10-07 NOTE — Telephone Encounter (Signed)
Follow up   Patient is returning call he has additional questions about the medication per the previous message.

## 2019-10-07 NOTE — Telephone Encounter (Signed)
Called patient, advised of message below-  Calling in [place], and ordered lab work. Patient verbalized understanding.

## 2019-12-01 NOTE — Progress Notes (Signed)
Cardiology Office Note:   Date:  12/02/2019  NAME:  Jared Roth    MRN: [id number] DOB:  1951-03-22   PCP:  Cassandria Anger, MD  Cardiologist:  Evalina Field, MD   Referring MD: Cassandria Anger, MD   Chief Complaint  Patient presents with  . Follow-up   History of Present Illness:   Jared Roth is a 69 y.o. male with a hx of HTN, HLD, non-obstructive CAD who presents for follow-up of syncope. Evaluated in January after several episodes of dizziness. Echo without abnormalities and CCTA with mild nonobstructive CAD. Was changed to crestor. Needs repeat lipid profile.  He reports he is had no further episodes of syncope.  The episode occurred in January in the setting of drinking alcohol.  He reports he is refrain from alcohol and had no further episodes.  He reports he does exercise rides his bike on the weekends.  He rides up to 20 miles and denies any chest pain or shortness of breath.  Blood pressure was a bit elevated but on my recheck it was 120/80.  He reports he keeps a log of his blood pressure at home and it is normally less than 140/80.  This is probably an acceptable goal for him.  Tolerating his aspirin and Crestor well.  No issues.  We will plan to recheck a lipid profile later this week fasting.  Problem List 1. CAD (CAC score 49, 51st percentile 07/04/2018) -CCTA 10/05/2019: Mild non-obstructive CAD (25-49%) 2. HTN 3. HLD (total 223, HDL 66, LDL 137, Tg 101 05/2019)  Past Medical History: Past Medical History:  Diagnosis Date  . Benign prostatic hypertrophy   . HSV infection   . Hypercholesteremia   . Hypertension     Past Surgical History: Past Surgical History:  Procedure Laterality Date  . CYST REMOVAL TRUNK    . ELBOW SURGERY      Current Medications: Current Meds  Medication Sig  . amLODipine (NORVASC) 5 MG tablet Take 1 tablet (5 mg total) by mouth daily.  Marland Kitchen b complex vitamins tablet Take 1 tablet by mouth daily.  . Cholecalciferol (VITAMIN  D3) 2000 units capsule Take 1 capsule (2,000 Units total) by mouth daily.  Marland Kitchen loratadine (CLARITIN) 10 MG tablet Take 1 tablet (10 mg total) by mouth daily.  Marland Kitchen LORazepam (ATIVAN) 0.5 MG tablet Take 1 tablet (0.5 mg total) by mouth 2 (two) times daily as needed for anxiety.  . metoprolol tartrate (LOPRESSOR) 50 MG tablet Take 1 tablet (50 mg total) by mouth once for 1 dose. TO BE TAKEN TWO HOURS PRIOR TO YOUR CARDIAC CT  . pantoprazole (PROTONIX) 40 MG tablet Take 1 tablet (40 mg total) by mouth daily.  . rosuvastatin (CRESTOR) 40 MG tablet Take 1 tablet (40 mg total) by mouth daily.  . tadalafil (CIALIS) 20 MG tablet Take 1 tablet (20 mg total) by mouth daily as needed for erectile dysfunction.  Marland Kitchen telmisartan (MICARDIS) 80 MG tablet Take 0.5 tablets (40 mg total) by mouth daily.     Allergies:    Atorvastatin and Sulfonamide derivatives   Social History: Social History   Socioeconomic History  . Marital status: Married    Spouse name: ruth  . Number of children: 2  . Years of education: Not on file  . Highest education level: Not on file  Occupational History  . Occupation: retired from [employer]  . Occupation: Geologist, engineering in [place]  . 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 Smoking status: Never Smoker  . Smokeless tobacco: Never Used  Substance and Sexual Activity  . Alcohol use: Yes    Comment: social use  . Drug use: No  . Sexual activity: Yes  Other Topics Concern  . Not on file  Social History Narrative   Pt has a total of 9 siblings---he is the youngest of 24   Social Determinants of Health   Financial Resource Strain:   . Difficulty of Paying Living Expenses:   Food Insecurity:   . Worried About Programme researcher, broadcasting/film/video in the Last Year:   . Barista in the Last Year:   Transportation Needs:   . Freight forwarder (Medical):   Marland Kitchen Lack of Transportation (Non-Medical):   Physical Activity:   . Days of  Exercise per Week:   . Minutes of Exercise per Session:   Stress:   . Feeling of Stress :   Social Connections:   . Frequency of Communication with Friends and Family:   . Frequency of Social Gatherings with Friends and Family:   . Attends Religious Services:   . Active Member of Clubs or Organizations:   . Attends Banker Meetings:   Marland Kitchen Marital Status:      Family History: The patient's family history includes Cancer in his brother; Heart disease in his father; Heart disease (age of onset: 1) in his sister; Hypertension in his mother; Stroke in his mother.  ROS:   All other ROS reviewed and negative. Pertinent positives noted in the HPI.     EKGs/Labs/Other Studies Reviewed:   The following studies were personally reviewed by me today:  CCTA 10/05/2019 IMPRESSION: 1. Coronary calcium score of 172. This was 80th percentile for age and sex matched control.  2. Normal coronary origin with right dominance.  3. 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  4. Small PFO noted.  TTE 09/11/2019 1. Left ventricular ejection fraction, by visual estimation, is 60 to  65%. The left ventricle has normal function. There is no left ventricular  hypertrophy.  2. The left ventricle has no regional wall motion abnormalities.  3. Global right ventricle has normal systolic function.The right  ventricular size is normal. No increase in right ventricular wall  thickness.  4. Left atrial size was normal.  5. Right atrial size was normal.  6. The mitral valve is normal in structure. No evidence of mitral valve  regurgitation. No evidence of mitral stenosis.  7. The tricuspid valve is normal in structure.  8. The aortic valve is normal in structure. Aortic valve regurgitation is  mild. No evidence of aortic valve sclerosis or stenosis.  9. The pulmonic valve was normal in structure. Pulmonic valve  regurgitation is trivial.  10. Normal pulmonary artery  systolic pressure.  11. The tricuspid regurgitant velocity is 2.07 m/s, and with an assumed  right atrial pressure of 3 mmHg, the estimated right ventricular systolic  pressure is normal at 20.1 mmHg.  12. The inferior vena cava is normal in size with greater than 50%  respiratory variability, suggesting right atrial pressure of 3 mmHg.   Recent Labs: 06/23/2019: ALT 18; Hemoglobin 13.2; Platelets 255.0 06/24/2019: TSH 0.88 10/01/2019: BNP 3.2; BUN 11; Creatinine, Ser 1.12; Potassium 4.5; Sodium 142   Recent Lipid Panel    Component Value Date/Time   CHOL 223 (H) 06/23/2019 0921   TRIG 101.0 06/23/2019 0921   TRIG 66 09/02/2006 1033  HDL 66.10 06/23/2019 0921   CHOLHDL 3 06/23/2019 0921   VLDL 20.2 06/23/2019 0921   LDLCALC 137 (H) 06/23/2019 0921   LDLDIRECT 172.5 09/02/2006 1033    Physical Exam:   VS:  BP 120/80   Pulse 62   Ht 5\' 6"  (1.676 m)   Wt 169 lb 9.6 oz (76.9 kg)   SpO2 95%   BMI 27.37 kg/m    Wt Readings from Last 3 Encounters:  12/02/19 169 lb 9.6 oz (76.9 kg)  09/23/19 165 lb (74.8 kg)  09/01/19 165 lb 3.2 oz (74.9 kg)    General: Well nourished, well developed, in no acute distress Heart: Atraumatic, normal size  Eyes: PEERLA, EOMI  Neck: Supple, no JVD Endocrine: No thryomegaly Cardiac: Normal S1, S2; RRR; no murmurs, rubs, or gallops Lungs: Clear to auscultation bilaterally, no wheezing, rhonchi or rales  Abd: Soft, nontender, no hepatomegaly  Ext: No edema, pulses 2+ Musculoskeletal: No deformities, BUE and BLE strength normal and equal Skin: Warm and dry, no rashes   Neuro: Alert and oriented to person, place, time, and situation, CNII-XII grossly intact, no focal deficits  Psych: Normal mood and affect   ASSESSMENT:   SAILOR HAUGHN is a 69 y.o. male who presents for the following: 1. Syncope, unspecified syncope type   2. Coronary artery disease involving native coronary artery of native heart without angina pectoris   3. Essential  hypertension   4. Mixed hyperlipidemia     PLAN:   1. Syncope, unspecified syncope type -Symptoms resolved.  Possibly is related to alcohol use.  He has refrained from alcohol and had no further symptoms.  Echocardiogram normal.  Nonobstructive CAD as discussed below.  No further work-up indicated.  2. Coronary artery disease involving native coronary artery of native heart without angina pectoris -Mild plaque noted on coronary CTA. -Continue aspirin Crestor 40 mg daily.  Repeat lipid profile later this week.  Fasting.  3. Essential hypertension -Blood pressure 120/80 on my recheck.  Continue current medications.  4. Mixed hyperlipidemia -Recheck lipid profile later this week.  Continue aspirin Crestor.  Goal less than 70.  Disposition: Return in about 1 year (around 12/01/2020).  Medication Adjustments/Labs and Tests Ordered: Current medicines are reviewed at length with the patient today.  Concerns regarding medicines are outlined above.  Orders Placed This Encounter  Procedures  . Lipid panel   No orders of the defined types were placed in this encounter.   Patient Instructions  Medication Instructions:  The current medical regimen is effective;  continue present plan and medications.  *If you need a refill on your cardiac medications before your next appointment, please call your pharmacy*   Lab Work: LIPID Attached are the lab orders that are needed before your upcoming appointment, please come in 1-2 weeks, anytime to have your labs drawn.   They are fasting labs, so nothing to eat or drink after midnight.  Lab hours: 8:00-4:00 lunch hours 12:45-1:45  If you have labs (blood work) drawn today and your tests are completely normal, you will receive your results only by: 01/31/2021 MyChart Message (if you have MyChart) OR . A paper copy in the mail If you have any lab test that is abnormal or we need to change your treatment, we will call you to review the results.    Follow-Up: At [facility], you and your health needs are our priority.  As part of our continuing mission to provide you with exceptional heart care, we have  created designated Provider Care Teams.  These Care Teams include your primary Cardiologist (physician) and Advanced Practice Providers (APPs -  Physician Assistants and Nurse Practitioners) who all work together to provide you with the care you need, when you need it.  We recommend signing up for the patient portal called "MyChart".  Sign up information is provided on this After Visit Summary.  MyChart is used to connect with patients for Virtual Visits (Telemedicine).  Patients are able to view lab/test results, encounter notes, upcoming appointments, etc.  Non-urgent messages can be sent to your provider as well.   To learn more about what you can do with MyChart, go to [email or web address].    Your next appointment:   12 month(s)  The format for your next appointment:   In Person  Provider:   Lennie Odor, MD        Time Spent with Patient: I have spent a total of 25 minutes with patient reviewing hospital notes, telemetry, EKGs, labs and examining the patient as well as establishing an assessment and plan that was discussed with the patient.  > 50% of time was spent in direct patient care.  Signed, Lenna Gilford. Flora Lipps, MD [facility]  [address], [address] [place], [place] [address] [phone]  12/02/2019 9:08 AM

## 2019-12-02 ENCOUNTER — Ambulatory Visit: Payer: Medicare HMO | Admitting: Cardiovascular Disease

## 2019-12-02 ENCOUNTER — Other Ambulatory Visit: Payer: Self-pay

## 2019-12-02 ENCOUNTER — Encounter: Payer: Self-pay | Admitting: Cardiovascular Disease

## 2019-12-02 VITALS — BP 120/80 | HR 62 | Ht 66.0 in | Wt 169.6 lb

## 2019-12-02 DIAGNOSIS — I10 Essential (primary) hypertension: Secondary | ICD-10-CM

## 2019-12-02 DIAGNOSIS — I2510 Atherosclerotic heart disease of native coronary artery without angina pectoris: Secondary | ICD-10-CM

## 2019-12-02 DIAGNOSIS — E782 Mixed hyperlipidemia: Secondary | ICD-10-CM | POA: Diagnosis not present

## 2019-12-02 DIAGNOSIS — R55 Syncope and collapse: Secondary | ICD-10-CM

## 2019-12-02 NOTE — Patient Instructions (Signed)
Medication Instructions:  The current medical regimen is effective;  continue present plan and medications.  *If you need a refill on your cardiac medications before your next appointment, please call your pharmacy*   Lab Work: LIPID Attached are the lab orders that are needed before your upcoming appointment, please come in 1-2 weeks, anytime to have your labs drawn.   They are fasting labs, so nothing to eat or drink after midnight.  Lab hours: 8:00-4:00 lunch hours 12:45-1:45  If you have labs (blood work) drawn today and your tests are completely normal, you will receive your results only by: Marland Kitchen MyChart Message (if you have MyChart) OR . A paper copy in the mail If you have any lab test that is abnormal or we need to change your treatment, we will call you to review the results.   Follow-Up: At [facility], you and your health needs are our priority.  As part of our continuing mission to provide you with exceptional heart care, we have created designated Provider Care Teams.  These Care Teams include your primary Cardiologist (physician) and Advanced Practice Providers (APPs -  Physician Assistants and Nurse Practitioners) who all work together to provide you with the care you need, when you need it.  We recommend signing up for the patient portal called "MyChart".  Sign up information is provided on this After Visit Summary.  MyChart is used to connect with patients for Virtual Visits (Telemedicine).  Patients are able to view lab/test results, encounter notes, upcoming appointments, etc.  Non-urgent messages can be sent to your provider as well.   To learn more about what you can do with MyChart, go to [email or web address].    Your next appointment:   12 month(s)  The format for your next appointment:   In Person  Provider:   Lennie Odor, MD

## 2019-12-16 ENCOUNTER — Other Ambulatory Visit: Payer: Self-pay

## 2019-12-16 DIAGNOSIS — E782 Mixed hyperlipidemia: Secondary | ICD-10-CM | POA: Diagnosis not present

## 2019-12-16 LAB — LIPID PANEL
Chol/HDL Ratio: 2.4 ratio (ref 0.0–5.0)
Cholesterol, Total: 141 mg/dL (ref 100–199)
HDL: 58 mg/dL (ref >39)
LDL Chol Calc (NIH): 67 mg/dL (ref 0–99)
Triglycerides: 82 mg/dL (ref 0–149)
VLDL Cholesterol Cal: 16 mg/dL (ref 5–40)

## 2019-12-22 ENCOUNTER — Other Ambulatory Visit: Payer: Self-pay | Admitting: Internal Medicine

## 2019-12-30 ENCOUNTER — Other Ambulatory Visit: Payer: Self-pay | Admitting: Internal Medicine

## 2020-01-13 ENCOUNTER — Ambulatory Visit (INDEPENDENT_AMBULATORY_CARE_PROVIDER_SITE_OTHER): Payer: Medicare HMO

## 2020-01-13 ENCOUNTER — Other Ambulatory Visit: Payer: Self-pay

## 2020-01-13 ENCOUNTER — Encounter: Payer: Self-pay | Admitting: Internal Medicine

## 2020-01-13 ENCOUNTER — Ambulatory Visit (INDEPENDENT_AMBULATORY_CARE_PROVIDER_SITE_OTHER): Payer: Medicare HMO | Admitting: Internal Medicine

## 2020-01-13 VITALS — BP 150/80 | HR 64 | Temp 98.1°F | Resp 16 | Ht 66.0 in | Wt 169.4 lb

## 2020-01-13 VITALS — BP 150/80 | HR 64 | Temp 98.0°F | Ht 66.0 in | Wt 169.0 lb

## 2020-01-13 DIAGNOSIS — Z23 Encounter for immunization: Secondary | ICD-10-CM | POA: Diagnosis not present

## 2020-01-13 DIAGNOSIS — Z0000 Encounter for general adult medical examination without abnormal findings: Secondary | ICD-10-CM

## 2020-01-13 DIAGNOSIS — I2510 Atherosclerotic heart disease of native coronary artery without angina pectoris: Secondary | ICD-10-CM | POA: Diagnosis not present

## 2020-01-13 DIAGNOSIS — I10 Essential (primary) hypertension: Secondary | ICD-10-CM | POA: Diagnosis not present

## 2020-01-13 MED ORDER — TADALAFIL 20 MG PO TABS: 20.0000 mg | ORAL_TABLET | Freq: Every day | ORAL | 3 refills | Status: DC | PRN

## 2020-01-13 MED ORDER — TELMISARTAN 80 MG PO TABS: 80.0000 mg | ORAL_TABLET | Freq: Every day | ORAL | 3 refills | Status: DC

## 2020-01-13 MED ORDER — ROSUVASTATIN CALCIUM 40 MG PO TABS: 40.0000 mg | ORAL_TABLET | Freq: Every day | ORAL | 3 refills | Status: DC

## 2020-01-13 MED ORDER — AMLODIPINE BESYLATE 5 MG PO TABS: 5.0000 mg | ORAL_TABLET | Freq: Every day | ORAL | 3 refills | Status: DC

## 2020-01-13 NOTE — Assessment & Plan Note (Signed)
-   Crestor 

## 2020-01-13 NOTE — Addendum Note (Signed)
Addended by: Scarlett Presto on: 01/13/2020 08:58 AM   Modules accepted: Orders

## 2020-01-13 NOTE — Assessment & Plan Note (Signed)
Micardis, Norvasc BP ok at home

## 2020-01-13 NOTE — Assessment & Plan Note (Signed)
  We discussed age appropriate health related issues, including available/recomended screening tests and vaccinations. Labs were ordered to be later reviewed . All questions were answered. We discussed one or more of the following - seat belt use, use of sunscreen/sun exposure exercise, safe sex, fall risk reduction, second hand smoke exposure, firearm use and storage, seat belt use, a need for adhering to healthy diet and exercise. Labs were ordered.  All questions were answered.  Colon due in 2027 (last 2017)

## 2020-01-13 NOTE — Progress Notes (Addendum)
Subjective:   Jared Roth is a 69 y.o. male who presents for Medicare Annual/Subsequent preventive examination.  Review of Systems:  No ROS. Medicare Wellness Visit. Additional risk factors are reflected in social history. Cardiac Risk Factors include: advanced age (>51men, >62 women);dyslipidemia;family history of premature cardiovascular disease;hypertension;male gender  Sleep Patterns: No sleep issues, feels rested on waking and sleeps 8 hours nightly; awaked during the night to void 2-3 times. Home Safety/Smoke Alarms: Feels safe in home; uses home alarm. Smoke alarms in place. Living environment: 2-story homes; Lives with spouse; no needs for DME; good support system. Seat Belt Safety/Bike Helmet: Wears seat belt.    Objective:    Vitals: BP (!) 150/80 (BP Location: Left Arm, Patient Position: Sitting, Cuff Size: Normal)   Pulse 64   Temp 98.1 F (36.7 C)   Resp 16   Ht 5\' 6"  (1.676 m)   Wt 169 lb 6.4 oz (76.8 kg)   SpO2 99%   BMI 27.34 kg/m   Body mass index is 27.34 kg/m.  Advanced Directives 01/13/2020  Does Patient Have a Medical Advance Directive? Yes  Type of Advance Directive Living will  Does patient want to make changes to medical advance directive? No - Patient declined    Tobacco Social History   Tobacco Use  Smoking Status Never Smoker  Smokeless Tobacco Never Used     Counseling given: Not Answered   Clinical Intake:  Pre-visit preparation completed: Yes  Pain : No/denies pain Pain Score: 0-No pain     BMI - recorded: 27.3 Nutritional Status: BMI 25 -29 Overweight Nutritional Risks: None Diabetes: No  How often do you need to have someone help you when you read instructions, pamphlets, or other written materials from your doctor or pharmacy?: 1 - Never What is the last grade level you completed in school?: 3 Years of college  Interpreter Needed?: No  Information entered by :: Verda Mehta N. [phone], LPN  Past Medical History:    Diagnosis Date   Benign prostatic hypertrophy    HSV infection    Hypercholesteremia    Hypertension    Past Surgical History:  Procedure Laterality Date   CYST REMOVAL TRUNK     ELBOW SURGERY     Family History  Problem Relation Age of Onset   Heart disease Father    Hypertension Mother    Stroke Mother    Cancer Brother        lymphoma or leukemia   Heart disease Sister 50       sudden death   Social History   Socioeconomic History   Marital status: Married    Spouse name: ruth   Number of children: 2   Years of education: Not on file   Highest education level: Not on file  Occupational History   Occupation: retired from [employer]   Occupation: 68 in [place]   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    Comment: [employer]  Tobacco Use   Smoking status: Never Smoker   Smokeless tobacco: Never Used  Substance and Sexual Activity   Alcohol use: Yes    Comment: social use   Drug use: No   Sexual activity: Yes  Other Topics Concern   Not on file  Social History Narrative   Pt has a total of 9 siblings---he is the youngest of 10   Social Determinants of Health   Financial Resource Strain:    Difficulty of Paying Living Expenses:  Food Insecurity:    Worried About Programme researcher, broadcasting/film/video in the Last Year:    Barista in the Last Year:   Transportation Needs:    Freight forwarder (Medical):    Lack of Transportation (Non-Medical):   Physical Activity:    Days of Exercise per Week:    Minutes of Exercise per Session:   Stress:    Feeling of Stress :   Social Connections:    Frequency of Communication with Friends and Family:    Frequency of Social Gatherings with Friends and Family:    Attends Religious Services:    Active Member of Clubs or Organizations:    Attends Banker Meetings:    Marital Status:     Outpatient Encounter Medications as of 01/13/2020  Medication Sig   amLODipine (NORVASC) 5 MG tablet TAKE  1 TABLET EVERY DAY   b complex vitamins tablet Take 1 tablet by mouth daily.   Cholecalciferol (VITAMIN D3) 2000 units capsule Take 1 capsule (2,000 Units total) by mouth daily.   rosuvastatin (CRESTOR) 40 MG tablet Take 1 tablet (40 mg total) by mouth daily.   telmisartan (MICARDIS) 80 MG tablet TAKE 1 TABLET EVERY DAY   loratadine (CLARITIN) 10 MG tablet Take 1 tablet (10 mg total) by mouth daily. (Patient not taking: Reported on 01/13/2020)   LORazepam (ATIVAN) 0.5 MG tablet Take 1 tablet (0.5 mg total) by mouth 2 (two) times daily as needed for anxiety. (Patient not taking: Reported on 01/13/2020)   metoprolol tartrate (LOPRESSOR) 50 MG tablet Take 1 tablet (50 mg total) by mouth once for 1 dose. TO BE TAKEN TWO HOURS PRIOR TO YOUR CARDIAC CT   pantoprazole (PROTONIX) 40 MG tablet Take 1 tablet (40 mg total) by mouth daily. (Patient not taking: Reported on 01/13/2020)   tadalafil (CIALIS) 20 MG tablet Take 1 tablet (20 mg total) by mouth daily as needed for erectile dysfunction. (Patient not taking: Reported on 01/13/2020)   No facility-administered encounter medications on file as of 01/13/2020.    Activities of Daily Living In your present state of health, do you have any difficulty performing the following activities: 01/13/2020  Hearing? N  Vision? N  Difficulty concentrating or making decisions? N  Walking or climbing stairs? N  Dressing or bathing? N  Doing errands, shopping? N  Preparing Food and eating ? N  Using the Toilet? N  In the past six months, have you accidently leaked urine? N  Do you have problems with loss of bowel control? N  Managing your Medications? N  Managing your Finances? N  Housekeeping or managing your Housekeeping? N  Some recent data might be hidden    Patient Care Team: Plotnikov, Georgina Quint, MD as PCP - General (Internal Medicine) O'Neal, Ronnald Ramp, MD as PCP - Cardiology (Cardiology)   Assessment:   This is a routine wellness examination for  Cohan.  Exercise Activities and Dietary recommendations Current Exercise Habits: The patient does not participate in regular exercise at present, Exercise limited by: None identified  Goals      Client understands the importance of follow-up with providers by attending scheduled visits        Fall Risk Fall Risk  01/13/2020 06/23/2019 06/17/2018  Falls in the past year? 1 1 No  Number falls in past yr: 0 0 -  Injury with Fall? 0 0 -  Risk for fall due to : No Fall Risks - -  Follow up Falls  evaluation completed;Education provided;Falls prevention discussed - -   Is the patient's home free of loose throw rugs in walkways, pet beds, electrical cords, etc?   yes      Grab bars in the bathroom? yes      Handrails on the stairs?   yes      Adequate lighting?   yes  Depression Screen PHQ 2/9 Scores 01/13/2020 01/13/2020 06/17/2018 12/18/2017  PHQ - 2 Score 0 0 0 0    Cognitive Function     6CIT Screen 01/13/2020  What Year? 0 points  What month? 0 points  What time? 0 points  Count back from 20 0 points  Months in reverse 0 points  Repeat phrase 0 points  Total Score 0    Immunization History  Administered Date(s) Administered   Fluad Quad(high Dose 65+) 04/30/2019   Influenza,inj,Quad PF,6+ Mos 06/08/2014, 08/02/2015, 06/18/2017, 04/23/2018   Tdap 12/07/2013   Zoster Recombinat (Shingrix) 01/23/2018, 03/12/2018    Qualifies for Shingles Vaccine?  Completed vaccination  Screening Tests Health Maintenance  Topic Date Due   Hepatitis C Screening  Never done   COVID-19 Vaccine (1) Never done   PNA vac Low Risk Adult (1 of 2 - PCV13) Never done   INFLUENZA VACCINE  03/27/2020   TETANUS/TDAP  12/08/2023   COLONOSCOPY  01/26/2026   Cancer Screenings: Lung: Low Dose CT Chest recommended if Age 54-80 years, 30 pack-year currently smoking OR have quit w/in 15years. Patient does not qualify. Colorectal: Yes, due 2027     Plan:     Reviewed health maintenance screenings  with patient today and relevant education, vaccines, and/or referrals were provided.    Continue doing brain stimulating activities (puzzles, reading, adult coloring books, staying active) to keep memory sharp.    Continue to eat heart healthy diet (full of fruits, vegetables, whole grains, lean protein, water--limit salt, fat, and sugar intake) and increase physical activity as tolerated.  I have personally reviewed and noted the following in the patient's chart:   Medical and social history Use of alcohol, tobacco or illicit drugs  Current medications and supplements Functional ability and status Nutritional status Physical activity Advanced directives List of other physicians Hospitalizations, surgeries, and ER visits in previous 12 months Vitals Screenings to include cognitive, depression, and falls Referrals and appointments  In addition, I have reviewed and discussed with patient certain preventive protocols, quality metrics, and best practice recommendations. A written personalized care plan for preventive services as well as general preventive health recommendations were provided to patient.     Sheral Flow, LPN  2/83/1517 Nurse Health Advisor  Medical screening examination/treatment/procedure(s) were performed by non-physician practitioner and as supervising physician I was immediately available for consultation/collaboration.  I agree with above. Lew Dawes, MD

## 2020-01-13 NOTE — Progress Notes (Signed)
Subjective:  Patient ID: Jared Roth, male    DOB: [birth year]  Age: 69 y.o. MRN: [id number]  CC: No chief complaint on file.   HPI Jared Roth presents for a well exam. F/u HTN - nl BP at home  Outpatient Medications Prior to Visit  Medication Sig Dispense Refill  . amLODipine (NORVASC) 5 MG tablet TAKE 1 TABLET EVERY DAY 90 tablet 3  . b complex vitamins tablet Take 1 tablet by mouth daily. 100 tablet 3  . Cholecalciferol (VITAMIN D3) 2000 units capsule Take 1 capsule (2,000 Units total) by mouth daily. 100 capsule 3  . rosuvastatin (CRESTOR) 40 MG tablet Take 1 tablet (40 mg total) by mouth daily. 90 tablet 3  . telmisartan (MICARDIS) 80 MG tablet TAKE 1 TABLET EVERY DAY 90 tablet 3  . loratadine (CLARITIN) 10 MG tablet Take 1 tablet (10 mg total) by mouth daily. (Patient not taking: Reported on 01/13/2020) 100 tablet 3  . LORazepam (ATIVAN) 0.5 MG tablet Take 1 tablet (0.5 mg total) by mouth 2 (two) times daily as needed for anxiety. (Patient not taking: Reported on 01/13/2020) 60 tablet 1  . metoprolol tartrate (LOPRESSOR) 50 MG tablet Take 1 tablet (50 mg total) by mouth once for 1 dose. TO BE TAKEN TWO HOURS PRIOR TO YOUR CARDIAC CT 1 tablet 0  . pantoprazole (PROTONIX) 40 MG tablet Take 1 tablet (40 mg total) by mouth daily. (Patient not taking: Reported on 01/13/2020) 30 tablet 3  . tadalafil (CIALIS) 20 MG tablet Take 1 tablet (20 mg total) by mouth daily as needed for erectile dysfunction. (Patient not taking: Reported on 01/13/2020) 30 tablet 3   No facility-administered medications prior to visit.    ROS: Review of Systems  Constitutional: Negative for appetite change, fatigue and unexpected weight change.  HENT: Negative for congestion, nosebleeds, sneezing, sore throat and trouble swallowing.   Eyes: Negative for itching and visual disturbance.  Respiratory: Negative for cough.   Cardiovascular: Negative for chest pain, palpitations and leg swelling.  Gastrointestinal:  Negative for abdominal distention, blood in stool, diarrhea and nausea.  Genitourinary: Negative for frequency and hematuria.  Musculoskeletal: Negative for back pain, gait problem, joint swelling and neck pain.  Skin: Negative for rash.  Neurological: Negative for dizziness, tremors, speech difficulty and weakness.  Psychiatric/Behavioral: Negative for agitation, dysphoric mood and sleep disturbance. The patient is not nervous/anxious.     Objective:  BP (!) 150/80   Pulse 64   Temp 98 F (36.7 C) (Oral)   Ht 5\' 6"  (1.676 m)   Wt 169 lb (76.7 kg)   SpO2 99%   BMI 27.28 kg/m   BP Readings from Last 3 Encounters:  01/13/20 (!) 150/80  01/13/20 (!) 150/80  12/02/19 120/80    Wt Readings from Last 3 Encounters:  01/13/20 169 lb (76.7 kg)  01/13/20 169 lb 6.4 oz (76.8 kg)  12/02/19 169 lb 9.6 oz (76.9 kg)    Physical Exam Constitutional:      General: He is not in acute distress.    Appearance: He is well-developed.     Comments: NAD  Eyes:     Conjunctiva/sclera: Conjunctivae normal.     Pupils: Pupils are equal, round, and reactive to light.  Neck:     Thyroid: No thyromegaly.     Vascular: No JVD.  Cardiovascular:     Rate and Rhythm: Normal rate and regular rhythm.     Heart sounds: Normal heart sounds. No murmur. No  friction rub. No gallop.   Pulmonary:     Effort: Pulmonary effort is normal. No respiratory distress.     Breath sounds: Normal breath sounds. No wheezing or rales.  Chest:     Chest wall: No tenderness.  Abdominal:     General: Bowel sounds are normal. There is no distension.     Palpations: Abdomen is soft. There is no mass.     Tenderness: There is no abdominal tenderness. There is no guarding or rebound.  Genitourinary:    Prostate: Normal.     Rectum: Normal. Guaiac result negative.  Musculoskeletal:        General: No tenderness. Normal range of motion.     Cervical back: Normal range of motion.  Lymphadenopathy:     Cervical: No  cervical adenopathy.  Skin:    General: Skin is warm and dry.     Findings: No rash.  Neurological:     Mental Status: He is alert and oriented to person, place, and time.     Cranial Nerves: No cranial nerve deficit.     Motor: No abnormal muscle tone.     Coordination: Coordination normal.     Gait: Gait normal.     Deep Tendon Reflexes: Reflexes are normal and symmetric.  Psychiatric:        Behavior: Behavior normal.        Thought Content: Thought content normal.        Judgment: Judgment normal.     Lab Results  Component Value Date   WBC 3.7 (L) 06/23/2019   HGB 13.2 06/23/2019   HCT 39.5 06/23/2019   PLT 255.0 06/23/2019   GLUCOSE 83 10/01/2019   CHOL 141 12/16/2019   TRIG 82 12/16/2019   HDL 58 12/16/2019   LDLDIRECT 172.5 09/02/2006   LDLCALC 67 12/16/2019   ALT 18 06/23/2019   AST 26 06/23/2019   NA 142 10/01/2019   K 4.5 10/01/2019   CL 103 10/01/2019   CREATININE 1.12 10/01/2019   BUN 11 10/01/2019   CO2 27 10/01/2019   TSH 0.88 06/24/2019   PSA 1.95 01/12/2019    CT CORONARY MORPH W/CTA COR W/SCORE W/CA W/CM &/OR WO/CM  Addendum Date: 10/05/2019   ADDENDUM REPORT: 10/05/2019 18:59 CLINICAL DATA:  Chest pain/syncope EXAM: Cardiac/Coronary CTA TECHNIQUE: The patient was scanned on a [employer]. A 100 kV prospective scan was triggered in the descending thoracic aorta at 111 HU's. Axial non-contrast 3 mm slices were carried out through the heart. The data set was analyzed on a dedicated work station and scored using the Agatson method. Gantry rotation speed was 250 msecs and collimation was .6 mm. No beta blockade and 0.8 mg of sl NTG was given. The 3D data set was reconstructed in 5% intervals of the 35-75 % of the R-R cycle. Diastolic phases were analyzed on a dedicated work station using MPR, MIP and VRT modes. The patient received 80 cc of contrast. FINDINGS: Image quality: excellent. Noise artifact is: Limited. Coronary Arteries:  Normal coronary  origin.  Right dominance. Left main: The left main is a large caliber vessel with a normal take off from the left coronary cusp that trifurcates into a LAD, LCX, and ramus intermedius. There is no plaque or stenosis. Left anterior descending artery: The proximal LAD is patent without plaque or stenosis. The minimal LAD contains mild non-calcified plaque (<25%). The distal LAD contains mild calcified plaque (25-49%). The first diagonal contains mild calcified plaque (25-49%). Ramus intermedius:  The ramus intermedius contains mild mixed density plaque (25-49%). Left circumflex artery: The LCX is non-dominant. The proximal LCX contains minimal calcified plaque (<25%). There is mild non-calcified plaque (25-49%) in the distal LCX. The LCX gives off 2 patent obtuse marginal branches. Right coronary artery: The RCA is dominant with normal take off from the right coronary cusp. The proximal RCA is patent. The mid RCA contains a mild non-calcified plaque (25-49%). There is artifact in this region due to an acute marginal branch, but this lesion appears mild. The distal RCA is patent. The RCA terminates as a PDA and right posterolateral branch without evidence of plaque or stenosis. Right Atrium: Right atrial size is within normal limits. Right Ventricle: The right ventricular cavity is within normal limits. Left Atrium: Left atrial size is normal in size with no left atrial appendage filling defect. A small PFO is noted. Left Ventricle: The ventricular cavity size is within normal limits. There are no stigmata of prior infarction. There is no abnormal filling defect. Pulmonary arteries: Normal in size without proximal filling defect. Pulmonary veins: Normal pulmonary venous drainage. Pericardium: Normal thickness with no significant effusion or calcium present. Cardiac valves: The aortic valve is trileaflet without significant calcification. The mitral valve is normal structure without significant calcification. Aorta:  Normal caliber with no significant disease. Extra-cardiac findings: See attached radiology report for non-cardiac structures. IMPRESSION: 1. Coronary calcium score of 172. This was 80th percentile for age and sex matched control. 2. Normal coronary origin with right dominance. 3. 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 4. Small PFO noted. RECOMMENDATIONS: 1. Mild non-obstructive CAD (25-49%). Consider non-atherosclerotic causes of chest pain. Consider preventive therapy and risk factor modification. Eleonore Chiquito, MD Electronically Signed   By: Eleonore Chiquito   On: 10/05/2019 18:59   Result Date: 10/05/2019 EXAM: OVER-READ INTERPRETATION  CT CHEST The following report is an over-read performed by radiologist Dr. Aletta Edouard of [facility], [place] on 10/05/2019. This over-read does not include interpretation of cardiac or coronary anatomy or pathology. The coronary CTA interpretation by the cardiologist is attached. COMPARISON:  Coronary calcium study on 07/04/2018 FINDINGS: Vascular: No incidental findings. Mediastinum/Nodes: Visualized mediastinum and hilar regions demonstrate no lymphadenopathy or focal masses. Lungs/Pleura: There is no evidence of pulmonary edema, consolidation, pneumothorax, nodule or pleural fluid. Upper Abdomen: No acute abnormality. Musculoskeletal: No chest wall mass or suspicious bone lesions identified. IMPRESSION: No incidental findings. Electronically Signed: By: Aletta Edouard M.D. On: 10/05/2019 11:38    Assessment & Plan:

## 2020-01-15 ENCOUNTER — Other Ambulatory Visit (INDEPENDENT_AMBULATORY_CARE_PROVIDER_SITE_OTHER): Payer: Medicare HMO

## 2020-01-15 DIAGNOSIS — Z125 Encounter for screening for malignant neoplasm of prostate: Secondary | ICD-10-CM | POA: Diagnosis not present

## 2020-01-15 DIAGNOSIS — Z0000 Encounter for general adult medical examination without abnormal findings: Secondary | ICD-10-CM

## 2020-01-15 LAB — URINALYSIS, ROUTINE W REFLEX MICROSCOPIC
Bilirubin Urine: NEGATIVE
Hgb urine dipstick: NEGATIVE
Ketones, ur: NEGATIVE
Nitrite: NEGATIVE
RBC / HPF: NONE SEEN (ref 0–?)
Specific Gravity, Urine: <=1.005 — AB (ref 1.000–1.030)
Total Protein, Urine: NEGATIVE
Urine Glucose: NEGATIVE
Urobilinogen, UA: 0.2 (ref 0.0–1.0)
pH: 6.5 (ref 5.0–8.0)

## 2020-01-15 LAB — HEPATIC FUNCTION PANEL
ALT: 28 U/L (ref 0–53)
AST: 27 U/L (ref 0–37)
Albumin: 4.3 g/dL (ref 3.5–5.2)
Alkaline Phosphatase: 69 U/L (ref 39–117)
Bilirubin, Direct: 0.1 mg/dL (ref 0.0–0.3)
Total Bilirubin: 0.4 mg/dL (ref 0.2–1.2)
Total Protein: 7.3 g/dL (ref 6.0–8.3)

## 2020-01-15 LAB — BASIC METABOLIC PANEL
BUN: 15 mg/dL (ref 6–23)
CO2: 30 mEq/L (ref 19–32)
Calcium: 9.2 mg/dL (ref 8.4–10.5)
Chloride: 103 mEq/L (ref 96–112)
Creatinine, Ser: 0.95 mg/dL (ref 0.40–1.50)
GFR: 95.8 mL/min (ref >60.00)
Glucose, Bld: 96 mg/dL (ref 70–99)
Potassium: 4 mEq/L (ref 3.5–5.1)
Sodium: 137 mEq/L (ref 135–145)

## 2020-01-15 LAB — TSH: TSH: 1.4 u[IU]/mL (ref 0.35–4.50)

## 2020-01-15 LAB — PSA: PSA: 1.64 ng/mL (ref 0.10–4.00)

## 2020-01-15 LAB — CBC WITH DIFFERENTIAL/PLATELET
Basophils Absolute: 0 10*3/uL (ref 0.0–0.1)
Basophils Relative: 0.8 % (ref 0.0–3.0)
Eosinophils Absolute: 0.1 10*3/uL (ref 0.0–0.7)
Eosinophils Relative: 3.2 % (ref 0.0–5.0)
HCT: 37.1 % — ABNORMAL LOW (ref 39.0–52.0)
Hemoglobin: 12.5 g/dL — ABNORMAL LOW (ref 13.0–17.0)
Lymphocytes Relative: 40.6 % (ref 12.0–46.0)
Lymphs Abs: 1.9 10*3/uL (ref 0.7–4.0)
MCHC: 33.8 g/dL (ref 30.0–36.0)
MCV: 92.9 fl (ref 78.0–100.0)
Monocytes Absolute: 0.7 10*3/uL (ref 0.1–1.0)
Monocytes Relative: 15 % — ABNORMAL HIGH (ref 3.0–12.0)
Neutro Abs: 1.8 10*3/uL (ref 1.4–7.7)
Neutrophils Relative %: 40.4 % — ABNORMAL LOW (ref 43.0–77.0)
Platelets: 250 10*3/uL (ref 150.0–400.0)
RBC: 4 Mil/uL — ABNORMAL LOW (ref 4.22–5.81)
RDW: 13.2 % (ref 11.5–15.5)
WBC: 4.6 10*3/uL (ref 4.0–10.5)

## 2020-01-18 ENCOUNTER — Other Ambulatory Visit: Payer: Self-pay | Admitting: Internal Medicine

## 2020-01-18 DIAGNOSIS — R710 Precipitous drop in hematocrit: Secondary | ICD-10-CM

## 2020-03-22 ENCOUNTER — Ambulatory Visit (INDEPENDENT_AMBULATORY_CARE_PROVIDER_SITE_OTHER): Payer: Medicare HMO | Admitting: Internal Medicine

## 2020-03-22 ENCOUNTER — Other Ambulatory Visit: Payer: Self-pay

## 2020-03-22 ENCOUNTER — Encounter: Payer: Self-pay | Admitting: Internal Medicine

## 2020-03-22 DIAGNOSIS — S50312A Abrasion of left elbow, initial encounter: Secondary | ICD-10-CM

## 2020-03-22 DIAGNOSIS — I10 Essential (primary) hypertension: Secondary | ICD-10-CM

## 2020-03-22 DIAGNOSIS — I2510 Atherosclerotic heart disease of native coronary artery without angina pectoris: Secondary | ICD-10-CM | POA: Diagnosis not present

## 2020-03-22 DIAGNOSIS — D649 Anemia, unspecified: Secondary | ICD-10-CM | POA: Insufficient documentation

## 2020-03-22 DIAGNOSIS — S50319A Abrasion of unspecified elbow, initial encounter: Secondary | ICD-10-CM | POA: Insufficient documentation

## 2020-03-22 DIAGNOSIS — R972 Elevated prostate specific antigen [PSA]: Secondary | ICD-10-CM | POA: Diagnosis not present

## 2020-03-22 DIAGNOSIS — F439 Reaction to severe stress, unspecified: Secondary | ICD-10-CM | POA: Diagnosis not present

## 2020-03-22 LAB — CBC WITH DIFFERENTIAL/PLATELET
Absolute Monocytes: 550 cells/uL (ref 200–950)
Basophils Absolute: 9 cells/uL (ref 0–200)
Basophils Relative: 0.2 %
Eosinophils Absolute: 82 cells/uL (ref 15–500)
Eosinophils Relative: 1.9 %
HCT: 37.9 % — ABNORMAL LOW (ref 38.5–50.0)
Hemoglobin: 12.3 g/dL — ABNORMAL LOW (ref 13.2–17.1)
Lymphs Abs: 1402 cells/uL (ref 850–3900)
MCH: 30.3 pg (ref 27.0–33.0)
MCHC: 32.5 g/dL (ref 32.0–36.0)
MCV: 93.3 fL (ref 80.0–100.0)
MPV: 10.7 fL (ref 7.5–12.5)
Monocytes Relative: 12.8 %
Neutro Abs: 2258 cells/uL (ref 1500–7800)
Neutrophils Relative %: 52.5 %
Platelets: 259 10*3/uL (ref 140–400)
RBC: 4.06 10*6/uL — ABNORMAL LOW (ref 4.20–5.80)
RDW: 13 % (ref 11.0–15.0)
Total Lymphocyte: 32.6 %
WBC: 4.3 10*3/uL (ref 3.8–10.8)

## 2020-03-22 NOTE — Assessment & Plan Note (Signed)
Lorazepam prn to try  Potential benefits of a long term benzodiazepines  use as well as potential risks  and complications were explained to the patient and were aknowledged.

## 2020-03-22 NOTE — Assessment & Plan Note (Signed)
PSA q year

## 2020-03-22 NOTE — Assessment & Plan Note (Signed)
ASA Crestor 
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
## 2020-03-22 NOTE — Assessment & Plan Note (Signed)
BP ok at home 

## 2020-03-22 NOTE — Assessment & Plan Note (Signed)
Labs

## 2020-03-22 NOTE — Addendum Note (Signed)
Addended by: Merrilyn Puma on: 03/22/2020 10:35 AM   Modules accepted: Orders

## 2020-03-22 NOTE — Assessment & Plan Note (Signed)
Dressed 

## 2020-03-23 LAB — BASIC METABOLIC PANEL WITH GFR
BUN: 13 mg/dL (ref 7–25)
CO2: 26 mmol/L (ref 20–32)
Calcium: 9.3 mg/dL (ref 8.6–10.3)
Chloride: 104 mmol/L (ref 98–110)
Creat: 0.99 mg/dL (ref 0.70–1.25)
GFR, Est African American: 92 mL/min/{1.73_m2} (ref >=60)
GFR, Est Non African American: 79 mL/min/{1.73_m2} (ref >=60)
Glucose, Bld: 98 mg/dL (ref 65–99)
Potassium: 4.6 mmol/L (ref 3.5–5.3)
Sodium: 140 mmol/L (ref 135–146)

## 2020-03-23 LAB — FERRITIN: Ferritin: 124 ng/mL (ref 24–380)

## 2020-07-01 DIAGNOSIS — Z20822 Contact with and (suspected) exposure to covid-19: Secondary | ICD-10-CM | POA: Diagnosis not present

## 2020-07-25 ENCOUNTER — Encounter: Payer: Self-pay | Admitting: Internal Medicine

## 2020-07-25 ENCOUNTER — Ambulatory Visit (INDEPENDENT_AMBULATORY_CARE_PROVIDER_SITE_OTHER): Payer: Medicare HMO | Admitting: Internal Medicine

## 2020-07-25 ENCOUNTER — Other Ambulatory Visit: Payer: Self-pay

## 2020-07-25 DIAGNOSIS — K219 Gastro-esophageal reflux disease without esophagitis: Secondary | ICD-10-CM

## 2020-07-25 DIAGNOSIS — I10 Essential (primary) hypertension: Secondary | ICD-10-CM | POA: Diagnosis not present

## 2020-07-25 DIAGNOSIS — I2510 Atherosclerotic heart disease of native coronary artery without angina pectoris: Secondary | ICD-10-CM | POA: Diagnosis not present

## 2020-07-25 DIAGNOSIS — F439 Reaction to severe stress, unspecified: Secondary | ICD-10-CM | POA: Diagnosis not present

## 2020-07-25 NOTE — Assessment & Plan Note (Signed)
Protonix.  ?

## 2020-07-25 NOTE — Patient Instructions (Signed)
You can try [place] Mushroom extract or capsules for memory   

## 2020-07-25 NOTE — Assessment & Plan Note (Signed)
Coronary calcium score of 81. Crestor

## 2020-07-25 NOTE — Assessment & Plan Note (Signed)
Micardis, Norvasc 

## 2020-07-25 NOTE — Progress Notes (Signed)
Subjective:  Patient ID: Jared Roth, male    DOB: 03-08-1951  Age: 69 y.o. MRN: [id number]  CC: Follow-up (4 month F/U)   HPI Jared Roth presents for CAD, vertigo, ringing in the ear  - s/p ENT ref f/u  Outpatient Medications Prior to Visit  Medication Sig Dispense Refill  . amLODipine (NORVASC) 5 MG tablet Take 1 tablet (5 mg total) by mouth daily. 90 tablet 3  . b complex vitamins tablet Take 1 tablet by mouth daily. 100 tablet 3  . Cholecalciferol (VITAMIN D3) 2000 units capsule Take 1 capsule (2,000 Units total) by mouth daily. 100 capsule 3  . loratadine (CLARITIN) 10 MG tablet Take 1 tablet (10 mg total) by mouth daily. 100 tablet 3  . rosuvastatin (CRESTOR) 40 MG tablet Take 1 tablet (40 mg total) by mouth daily. 90 tablet 3  . telmisartan (MICARDIS) 80 MG tablet Take 1 tablet (80 mg total) by mouth daily. 90 tablet 3  . tadalafil (CIALIS) 20 MG tablet Take 1 tablet (20 mg total) by mouth daily as needed for erectile dysfunction. (Patient not taking: Reported on 07/25/2020) 30 tablet 3   No facility-administered medications prior to visit.    ROS: Review of Systems  Constitutional: Negative for appetite change, fatigue and unexpected weight change.  HENT: Negative for congestion, nosebleeds, sneezing, sore throat and trouble swallowing.   Eyes: Negative for itching and visual disturbance.  Respiratory: Negative for cough.   Cardiovascular: Negative for chest pain, palpitations and leg swelling.  Gastrointestinal: Negative for abdominal distention, blood in stool, diarrhea and nausea.  Genitourinary: Negative for frequency and hematuria.  Musculoskeletal: Negative for back pain, gait problem, joint swelling and neck pain.  Skin: Negative for rash.  Neurological: Positive for dizziness. Negative for tremors, speech difficulty and weakness.  Psychiatric/Behavioral: Negative for agitation, dysphoric mood and sleep disturbance. The patient is not nervous/anxious.      Objective:  BP 134/86 (BP Location: Left Arm)   Pulse (!) 57   Temp 98.1 F (36.7 C) (Oral)   Wt 169 lb 1 oz (76.7 kg)   SpO2 98%   BMI 27.29 kg/m   BP Readings from Last 3 Encounters:  07/25/20 134/86  03/22/20 (!) 148/90  01/13/20 (!) 150/80    Wt Readings from Last 3 Encounters:  07/25/20 169 lb 1 oz (76.7 kg)  03/22/20 168 lb (76.2 kg)  01/13/20 169 lb (76.7 kg)    Physical Exam Constitutional:      General: He is not in acute distress.    Appearance: He is well-developed.     Comments: NAD  Eyes:     Conjunctiva/sclera: Conjunctivae normal.     Pupils: Pupils are equal, round, and reactive to light.  Neck:     Thyroid: No thyromegaly.     Vascular: No JVD.  Cardiovascular:     Rate and Rhythm: Normal rate and regular rhythm.     Heart sounds: Normal heart sounds. No murmur heard.  No friction rub. No gallop.   Pulmonary:     Effort: Pulmonary effort is normal. No respiratory distress.     Breath sounds: Normal breath sounds. No wheezing or rales.  Chest:     Chest wall: No tenderness.  Abdominal:     General: Bowel sounds are normal. There is no distension.     Palpations: Abdomen is soft. There is no mass.     Tenderness: There is no abdominal tenderness. There is no guarding or rebound.  Musculoskeletal:        General: No tenderness. Normal range of motion.     Cervical back: Normal range of motion.  Lymphadenopathy:     Cervical: No cervical adenopathy.  Skin:    General: Skin is warm and dry.     Findings: No rash.  Neurological:     Mental Status: He is alert and oriented to person, place, and time.     Cranial Nerves: No cranial nerve deficit.     Motor: No abnormal muscle tone.     Coordination: Coordination normal.     Gait: Gait normal.     Deep Tendon Reflexes: Reflexes are normal and symmetric.  Psychiatric:        Behavior: Behavior normal.        Thought Content: Thought content normal.        Judgment: Judgment normal.    upper plate  Lab Results  Component Value Date   WBC 4.3 03/22/2020   HGB 12.3 (L) 03/22/2020   HCT 37.9 (L) 03/22/2020   PLT 259 03/22/2020   GLUCOSE 98 03/22/2020   CHOL 141 12/16/2019   TRIG 82 12/16/2019   HDL 58 12/16/2019   LDLDIRECT 172.5 09/02/2006   LDLCALC 67 12/16/2019   ALT 28 01/15/2020   AST 27 01/15/2020   NA 140 03/22/2020   K 4.6 03/22/2020   CL 104 03/22/2020   CREATININE 0.99 03/22/2020   BUN 13 03/22/2020   CO2 26 03/22/2020   TSH 1.40 01/15/2020   PSA 1.64 01/15/2020    CT CORONARY MORPH W/CTA COR W/SCORE W/CA W/CM &/OR WO/CM  Addendum Date: 10/05/2019   ADDENDUM REPORT: 10/05/2019 18:59 CLINICAL DATA:  Chest pain/syncope EXAM: Cardiac/Coronary CTA TECHNIQUE: The patient was scanned on a [employer]. A 100 kV prospective scan was triggered in the descending thoracic aorta at 111 HU's. Axial non-contrast 3 mm slices were carried out through the heart. The data set was analyzed on a dedicated work station and scored using the Agatson method. Gantry rotation speed was 250 msecs and collimation was .6 mm. No beta blockade and 0.8 mg of sl NTG was given. The 3D data set was reconstructed in 5% intervals of the 35-75 % of the R-R cycle. Diastolic phases were analyzed on a dedicated work station using MPR, MIP and VRT modes. The patient received 80 cc of contrast. FINDINGS: Image quality: excellent. Noise artifact is: Limited. Coronary Arteries:  Normal coronary origin.  Right dominance. Left main: The left main is a large caliber vessel with a normal take off from the left coronary cusp that trifurcates into a LAD, LCX, and ramus intermedius. There is no plaque or stenosis. Left anterior descending artery: The proximal LAD is patent without plaque or stenosis. The minimal LAD contains mild non-calcified plaque (<25%). The distal LAD contains mild calcified plaque (25-49%). The first diagonal contains mild calcified plaque (25-49%). Ramus intermedius: The ramus  intermedius contains mild mixed density plaque (25-49%). Left circumflex artery: The LCX is non-dominant. The proximal LCX contains minimal calcified plaque (<25%). There is mild non-calcified plaque (25-49%) in the distal LCX. The LCX gives off 2 patent obtuse marginal branches. Right coronary artery: The RCA is dominant with normal take off from the right coronary cusp. The proximal RCA is patent. The mid RCA contains a mild non-calcified plaque (25-49%). There is artifact in this region due to an acute marginal branch, but this lesion appears mild. The distal RCA is patent. The RCA terminates as a  PDA and right posterolateral branch without evidence of plaque or stenosis. Right Atrium: Right atrial size is within normal limits. Right Ventricle: The right ventricular cavity is within normal limits. Left Atrium: Left atrial size is normal in size with no left atrial appendage filling defect. A small PFO is noted. Left Ventricle: The ventricular cavity size is within normal limits. There are no stigmata of prior infarction. There is no abnormal filling defect. Pulmonary arteries: Normal in size without proximal filling defect. Pulmonary veins: Normal pulmonary venous drainage. Pericardium: Normal thickness with no significant effusion or calcium present. Cardiac valves: The aortic valve is trileaflet without significant calcification. The mitral valve is normal structure without significant calcification. Aorta: Normal caliber with no significant disease. Extra-cardiac findings: See attached radiology report for non-cardiac structures. IMPRESSION: 1. Coronary calcium score of 172. This was 80th percentile for age and sex matched control. 2. Normal coronary origin with right dominance. 3. 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 4. Small PFO noted. RECOMMENDATIONS: 1. Mild non-obstructive CAD (25-49%). Consider non-atherosclerotic causes of chest pain. Consider preventive therapy and risk factor  modification. Lennie Odor, MD Electronically Signed   By: Lennie Odor   On: 10/05/2019 18:59   Result Date: 10/05/2019 EXAM: OVER-READ INTERPRETATION  CT CHEST The following report is an over-read performed by radiologist Dr. Irish Lack of [facility] on 10/05/2019. This over-read does not include interpretation of cardiac or coronary anatomy or pathology. The coronary CTA interpretation by the cardiologist is attached. COMPARISON:  Coronary calcium study on 07/04/2018 FINDINGS: Vascular: No incidental findings. Mediastinum/Nodes: Visualized mediastinum and hilar regions demonstrate no lymphadenopathy or focal masses. Lungs/Pleura: There is no evidence of pulmonary edema, consolidation, pneumothorax, nodule or pleural fluid. Upper Abdomen: No acute abnormality. Musculoskeletal: No chest wall mass or suspicious bone lesions identified. IMPRESSION: No incidental findings. Electronically Signed: By: Irish Lack M.D. On: 10/05/2019 11:38    Assessment & Plan:    Sonda Primes, MD

## 2020-07-25 NOTE — Assessment & Plan Note (Signed)
Lorazepam prn to try  Potential benefits of a long term benzodiazepines  use as well as potential risks  and complications were explained to the patient and were aknowledged.

## 2020-08-24 DIAGNOSIS — R42 Dizziness and giddiness: Secondary | ICD-10-CM | POA: Diagnosis not present

## 2020-08-24 DIAGNOSIS — H9319 Tinnitus, unspecified ear: Secondary | ICD-10-CM | POA: Diagnosis not present

## 2020-08-24 DIAGNOSIS — H903 Sensorineural hearing loss, bilateral: Secondary | ICD-10-CM | POA: Diagnosis not present

## 2020-12-01 ENCOUNTER — Other Ambulatory Visit: Payer: Self-pay | Admitting: *Deleted

## 2020-12-01 MED ORDER — TELMISARTAN 80 MG PO TABS: 80.0000 mg | ORAL_TABLET | Freq: Every day | ORAL | 0 refills | Status: DC

## 2020-12-01 MED ORDER — ROSUVASTATIN CALCIUM 40 MG PO TABS: 40.0000 mg | ORAL_TABLET | Freq: Every day | ORAL | 0 refills | Status: DC

## 2020-12-15 ENCOUNTER — Other Ambulatory Visit: Payer: Self-pay

## 2020-12-18 NOTE — Progress Notes (Deleted)
Cardiology Office Note:   Date:  12/18/2020  NAME:  Jared Roth    MRN: [id number] DOB:  1951/08/16   PCP:  Tresa Garter, MD  Cardiologist:  Reatha Harps, MD  Electrophysiologist:  None   Referring MD: Tresa Garter, MD   No chief complaint on file. ***  History of Present Illness:   Jared FLORENDO is a 70 y.o. male with a hx of non-obstructive CAD (coronary calcium score 172, 80th precentile, 25-49% non-obstructive CAD, HTN, HLD who presents for follow-up. Cholesterol under control on crestor.   Problem List 1. CAD  -CAC score 172, 80th percentile -CCTA 10/05/2019: Mild non-obstructive CAD (25-49%) 2. HTN 3. HLD  -T chol 141, HDL 58, TG 82, LDL 67  Past Medical History: Past Medical History:  Diagnosis Date  . Benign prostatic hypertrophy   . HSV infection   . Hypercholesteremia   . Hypertension     Past Surgical History: Past Surgical History:  Procedure Laterality Date  . CYST REMOVAL TRUNK    . ELBOW SURGERY      Current Medications: No outpatient medications have been marked as taking for the 12/20/20 encounter (Appointment) with O'Neal, Ronnald Ramp, MD.     Allergies:    Atorvastatin and Sulfonamide derivatives   Social History: Social History   Socioeconomic History  . Marital status: Married    Spouse name: ruth  . Number of children: 2  . Years of education: Not on file  . Highest education level: Not on file  Occupational History  . Occupation: retired from [employer]  . 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 Smoking status: Never Smoker  . Smokeless tobacco: Never Used  Substance and Sexual Activity  . Alcohol use: Yes    Comment: social use  . Drug use: No  . Sexual activity: Yes  Other Topics Concern  . Not on file  Social History Narrative   Pt has a total of 9 siblings---he is the youngest of 71   Social Determinants of Health    Financial Resource Strain: Not on file  Food Insecurity: Not on file  Transportation Needs: Not on file  Physical Activity: Not on file  Stress: Not on file  Social Connections: Not on file     Family History: The patient's ***family history includes Cancer in his brother; Heart disease in his father; Heart disease (age of onset: 39) in his sister; Hypertension in his mother; Stroke in his mother.  ROS:   All other ROS reviewed and negative. Pertinent positives noted in the HPI.     EKGs/Labs/Other Studies Reviewed:   The following studies were personally reviewed by me today:  EKG:  EKG is *** ordered today.  The ekg ordered today demonstrates ***, and was personally reviewed by me.   Recent Labs: 01/15/2020: ALT 28; TSH 1.40 03/22/2020: BUN 13; Creat 0.99; Hemoglobin 12.3; Platelets 259; Potassium 4.6; Sodium 140   Recent Lipid Panel    Component Value Date/Time   CHOL 141 12/16/2019 1135   TRIG 82 12/16/2019 1135   TRIG 66 09/02/2006 1033   HDL 58 12/16/2019 1135   CHOLHDL 2.4 12/16/2019 1135   CHOLHDL 3 06/23/2019 0921   VLDL 20.2 06/23/2019 0921   LDLCALC 67 12/16/2019 1135   LDLDIRECT 172.5 09/02/2006 1033    Physical Exam:   VS:  There were no vitals taken for  this visit.   Wt Readings from Last 3 Encounters:  07/25/20 169 lb 1 oz (76.7 kg)  03/22/20 168 lb (76.2 kg)  01/13/20 169 lb (76.7 kg)    General: Well nourished, well developed, in no acute distress Head: Atraumatic, normal size  Eyes: PEERLA, EOMI  Neck: Supple, no JVD Endocrine: No thryomegaly Cardiac: Normal S1, S2; RRR; no murmurs, rubs, or gallops Lungs: Clear to auscultation bilaterally, no wheezing, rhonchi or rales  Abd: Soft, nontender, no hepatomegaly  Ext: No edema, pulses 2+ Musculoskeletal: No deformities, BUE and BLE strength normal and equal Skin: Warm and dry, no rashes   Neuro: Alert and oriented to person, place, time, and situation, CNII-XII grossly intact, no focal deficits   Psych: Normal mood and affect   ASSESSMENT:   Jared Roth is a 70 y.o. male who presents for the following: No diagnosis found.  PLAN:   There are no diagnoses linked to this encounter.  Disposition: No follow-ups on file.  Medication Adjustments/Labs and Tests Ordered: Current medicines are reviewed at length with the patient today.  Concerns regarding medicines are outlined above.  No orders of the defined types were placed in this encounter.  No orders of the defined types were placed in this encounter.   There are no Patient Instructions on file for this visit.   Time Spent with Patient: I have spent a total of *** minutes with patient reviewing hospital notes, telemetry, EKGs, labs and examining the patient as well as establishing an assessment and plan that was discussed with the patient.  > 50% of time was spent in direct patient care.  Signed, Lenna Gilford. Flora Lipps, MD, [facility]  [facility]  [address] [place], [place] [address] [phone]  12/18/2020 9:08 PM

## 2020-12-19 ENCOUNTER — Ambulatory Visit: Payer: Medicare HMO | Admitting: Internal Medicine

## 2020-12-20 ENCOUNTER — Ambulatory Visit: Payer: Medicare HMO | Admitting: Cardiovascular Disease

## 2020-12-20 DIAGNOSIS — E782 Mixed hyperlipidemia: Secondary | ICD-10-CM

## 2020-12-20 DIAGNOSIS — I2510 Atherosclerotic heart disease of native coronary artery without angina pectoris: Secondary | ICD-10-CM

## 2020-12-20 DIAGNOSIS — R931 Abnormal findings on diagnostic imaging of heart and coronary circulation: Secondary | ICD-10-CM

## 2021-01-13 DIAGNOSIS — R231 Pallor: Secondary | ICD-10-CM | POA: Diagnosis not present

## 2021-01-13 DIAGNOSIS — R110 Nausea: Secondary | ICD-10-CM | POA: Diagnosis not present

## 2021-01-13 DIAGNOSIS — R531 Weakness: Secondary | ICD-10-CM | POA: Diagnosis not present

## 2021-01-13 DIAGNOSIS — R55 Syncope and collapse: Secondary | ICD-10-CM | POA: Diagnosis not present

## 2021-01-13 DIAGNOSIS — E785 Hyperlipidemia, unspecified: Secondary | ICD-10-CM | POA: Diagnosis not present

## 2021-01-13 DIAGNOSIS — R42 Dizziness and giddiness: Secondary | ICD-10-CM | POA: Diagnosis not present

## 2021-01-13 DIAGNOSIS — I10 Essential (primary) hypertension: Secondary | ICD-10-CM | POA: Diagnosis not present

## 2021-01-13 DIAGNOSIS — R001 Bradycardia, unspecified: Secondary | ICD-10-CM | POA: Diagnosis not present

## 2021-01-13 DIAGNOSIS — Z79899 Other long term (current) drug therapy: Secondary | ICD-10-CM | POA: Diagnosis not present

## 2021-01-13 DIAGNOSIS — R112 Nausea with vomiting, unspecified: Secondary | ICD-10-CM | POA: Diagnosis not present

## 2021-01-16 NOTE — Progress Notes (Signed)
Cardiology Office Note:   Date:  01/18/2021  NAME:  Jared Roth    MRN: [id number] DOB:  1950/12/26   PCP:  Tresa GarterPlotnikov, Aleksei V, MD  Cardiologist:  Reatha HarpsWesley T O'Neal, MD  Electrophysiologist:  None   Referring MD: Tresa GarterPlotnikov, Aleksei V, MD   Chief Complaint  Patient presents with  . Follow-up    History of Present Illness:   Jared Roth is a 70 y.o. male with a hx of HTN and non-obstructive CAD who presents for follow-up. Seen in the ER for pre-syncope 01/13/2021. Work-up negative.   Reports he had a dizzy spell and felt he was going to pass out last Friday.  This occurred around 6:30 PM.  He reports when he got home he just felt dizzy and lightheaded.  He reports he became sweaty.  He reports his wife checked his blood pressure and it was 190.  He reports this may be inaccurate.  He reports he laid back and felt better after several hours.  He reports he did vomit.  He reports night before he had had alcohol to drink.  Not much.  He reports he did have only 1 bottle of water to drink per day.  He reports he had cut the grass that morning.  It was a hot day.  He reports he may have had 1 cup of tea.  He reports he is not drinking enough water.  He went to the emergency room and work-up was negative.  He presents for follow-up.  He had no further episodes where he felt he was in a pass out.  He does report that he has dentures.  He reports constant discomfort.  He reports he can feel lightheaded with this as well.  When he takes his dentures out he feels better.  He is still riding his bike when the weather permits.  Up to 10 miles with no chest pain or shortness of breath.  His EKG in office demonstrates sinus rhythm with no acute ischemic changes or evidence of infarction.  There is no other updates to his medical history.  BP a bit elevated 146/82.  Overall his examination is benign.   Problem List 1. CAD -CAC score 81, 51st percentile 07/04/2018 -CCTA 10/05/2019: Mild non-obstructive CAD  (25-49%) 2. HTN 3. HLD  -T chol 141, HDL 58, LDL 67, TG 82  Past Medical History: Past Medical History:  Diagnosis Date  . Benign prostatic hypertrophy   . HSV infection   . Hypercholesteremia   . Hypertension     Past Surgical History: Past Surgical History:  Procedure Laterality Date  . CYST REMOVAL TRUNK    . ELBOW SURGERY      Current Medications: Current Meds  Medication Sig  . amLODipine (NORVASC) 5 MG tablet Take 1 tablet (5 mg total) by mouth daily.  Marland Kitchen. b complex vitamins tablet Take 1 tablet by mouth daily.  . Cholecalciferol (VITAMIN D3) 2000 units capsule Take 1 capsule (2,000 Units total) by mouth daily.  . rosuvastatin (CRESTOR) 40 MG tablet Take 1 tablet (40 mg total) by mouth daily. Annual appt due in June w/labs must see provider for future refills  . telmisartan (MICARDIS) 80 MG tablet Take 1 tablet (80 mg total) by mouth daily. Annual appt due in June w/labs must see provider for future refills     Allergies:    Atorvastatin and Sulfonamide derivatives   Social History: Social History   Socioeconomic History  . Marital status: Married  Spouse name: ruth  . Number of children: 2  . Years of education: Not on file  . Highest education level: Not on file  Occupational History  . Occupation: retired from [employer]  . 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 Smoking status: Never Smoker  . Smokeless tobacco: Never Used  Substance and Sexual Activity  . Alcohol use: Yes    Comment: social use  . Drug use: No  . Sexual activity: Yes  Other Topics Concern  . Not on file  Social History Narrative   Pt has a total of 9 siblings---he is the youngest of 23   Social Determinants of Health   Financial Resource Strain: Not on file  Food Insecurity: Not on file  Transportation Needs: Not on file  Physical Activity: Not on file  Stress: Not on file  Social  Connections: Not on file    Family History: The patient's family history includes Cancer in his brother; Heart disease in his father; Heart disease (age of onset: 44) in his sister; Hypertension in his mother; Stroke in his mother.  ROS:   All other ROS reviewed and negative. Pertinent positives noted in the HPI.     EKGs/Labs/Other Studies Reviewed:   The following studies were personally reviewed by me today:  EKG:  EKG is ordered today.  The ekg ordered today demonstrates sinus bradycardia heart rate 56, no acute ischemic changes, no evidence of infarction, and was personally reviewed by me.   TTE 09/11/2019 1. Left ventricular ejection fraction, by visual estimation, is 60 to  65%. The left ventricle has normal function. There is no left ventricular  hypertrophy.  2. The left ventricle has no regional wall motion abnormalities.  3. Global right ventricle has normal systolic function.The right  ventricular size is normal. No increase in right ventricular wall  thickness.  4. Left atrial size was normal.  5. Right atrial size was normal.  6. The mitral valve is normal in structure. No evidence of mitral valve  regurgitation. No evidence of mitral stenosis.  7. The tricuspid valve is normal in structure.  8. The aortic valve is normal in structure. Aortic valve regurgitation is  mild. No evidence of aortic valve sclerosis or stenosis.  9. The pulmonic valve was normal in structure. Pulmonic valve  regurgitation is trivial.  10. Normal pulmonary artery systolic pressure.  11. The tricuspid regurgitant velocity is 2.07 m/s, and with an assumed  right atrial pressure of 3 mmHg, the estimated right ventricular systolic  pressure is normal at 20.1 mmHg.  12. The inferior vena cava is normal in size with greater than 50%  respiratory variability, suggesting right atrial pressure of 3 mmHg.   Recent Labs: 03/22/2020: BUN 13; Creat 0.99; Hemoglobin 12.3; Platelets 259; Potassium  4.6; Sodium 140   Recent Lipid Panel    Component Value Date/Time   CHOL 141 12/16/2019 1135   TRIG 82 12/16/2019 1135   TRIG 66 09/02/2006 1033   HDL 58 12/16/2019 1135   CHOLHDL 2.4 12/16/2019 1135   CHOLHDL 3 06/23/2019 0921   VLDL 20.2 06/23/2019 0921   LDLCALC 67 12/16/2019 1135   LDLDIRECT 172.5 09/02/2006 1033    Physical Exam:   VS:  BP (!) 146/82   Pulse (!) 56   Ht 5\' 6"  (1.676 m)   Wt 170 lb 9.6 oz (77.4 kg)   SpO2 95%  BMI 27.54 kg/m    Wt Readings from Last 3 Encounters:  01/18/21 170 lb 9.6 oz (77.4 kg)  07/25/20 169 lb 1 oz (76.7 kg)  03/22/20 168 lb (76.2 kg)    General: Well nourished, well developed, in no acute distress Head: Atraumatic, normal size  Eyes: PEERLA, EOMI  Neck: Supple, no JVD Endocrine: No thryomegaly Cardiac: Normal S1, S2; RRR; no murmurs, rubs, or gallops Lungs: Clear to auscultation bilaterally, no wheezing, rhonchi or rales  Abd: Soft, nontender, no hepatomegaly  Ext: No edema, pulses 2+ Musculoskeletal: No deformities, BUE and BLE strength normal and equal Skin: Warm and dry, no rashes   Neuro: Alert and oriented to person, place, time, and situation, CNII-XII grossly intact, no focal deficits  Psych: Normal mood and affect   ASSESSMENT:   Jared Roth is a 70 y.o. male who presents for the following: 1. Dizziness   2. Vasovagal near-syncope   3. Dehydration   4. Coronary artery disease involving native coronary artery of native heart without angina pectoris   5. Agatston coronary artery calcium score less than 100   6. Mixed hyperlipidemia     PLAN:   1. Dizziness 2. Vasovagal near-syncope 3. Dehydration -Evaluated the emergency room last week for dizziness and presyncope.  He did not pass out.  He reports he had sweatiness and vomiting.  He only had 1 cup of water the whole day to drink.  His work-up was unremarkable.  EKG in office normal.  Cardiovascular examination normal. -I suspect this was a vasovagal episode.   This is related dehydration.  I recommended more water intake.  He will work on this. -I see no need for further cardiac testing.  He can still maintain high level of activity without any chest pain or shortness of breath.  4. Coronary artery disease involving native coronary artery of native heart without angina pectoris 5. Agatston coronary artery calcium score less than 100 6. Mixed hyperlipidemia -Mild nonobstructive CAD on coronary CTA 10/05/2019.  No symptoms of angina.  Most recent LDL at goal.  Continue aspirin and statin.  Disposition: Return in about 1 year (around 01/18/2022).  Medication Adjustments/Labs and Tests Ordered: Current medicines are reviewed at length with the patient today.  Concerns regarding medicines are outlined above.  Orders Placed This Encounter  Procedures  . EKG 12-Lead   No orders of the defined types were placed in this encounter.   Patient Instructions  Medication Instructions:  The current medical regimen is effective;  continue present plan and medications.  *If you need a refill on your cardiac medications before your next appointment, please call your pharmacy*   Follow-Up: At [facility], you and your health needs are our priority.  As part of our continuing mission to provide you with exceptional heart care, we have created designated Provider Care Teams.  These Care Teams include your primary Cardiologist (physician) and Advanced Practice Providers (APPs -  Physician Assistants and Nurse Practitioners) who all work together to provide you with the care you need, when you need it.  We recommend signing up for the patient portal called "MyChart".  Sign up information is provided on this After Visit Summary.  MyChart is used to connect with patients for Virtual Visits (Telemedicine).  Patients are able to view lab/test results, encounter notes, upcoming appointments, etc.  Non-urgent messages can be sent to your provider as well.   To learn more  about what you can do with MyChart, go to [email or web address].  Your next appointment:   12 month(s)  The format for your next appointment:   In Person  Provider:   You may see Reatha Harps, MD or one of the following Advanced Practice Providers on your designated Care Team:    Azalee Course, PA-C  Micah Flesher, PA-C or   Judy Pimple, [place]    Other Instructions Please drink 60-80 ounces of water a day.       Time Spent with Patient: I have spent a total of 25 minutes with patient reviewing hospital notes, telemetry, EKGs, labs and examining the patient as well as establishing an assessment and plan that was discussed with the patient.  > 50% of time was spent in direct patient care.  Signed, Lenna Gilford. Flora Lipps, MD, [facility]  [facility]  [address] [place], [place] [address] [phone]  01/18/2021 9:46 AM

## 2021-01-18 ENCOUNTER — Other Ambulatory Visit: Payer: Self-pay

## 2021-01-18 ENCOUNTER — Ambulatory Visit (INDEPENDENT_AMBULATORY_CARE_PROVIDER_SITE_OTHER): Payer: Medicare HMO | Admitting: Cardiovascular Disease

## 2021-01-18 ENCOUNTER — Encounter: Payer: Self-pay | Admitting: Cardiovascular Disease

## 2021-01-18 VITALS — BP 146/82 | HR 56 | Ht 66.0 in | Wt 170.6 lb

## 2021-01-18 DIAGNOSIS — E860 Dehydration: Secondary | ICD-10-CM

## 2021-01-18 DIAGNOSIS — I2510 Atherosclerotic heart disease of native coronary artery without angina pectoris: Secondary | ICD-10-CM | POA: Diagnosis not present

## 2021-01-18 DIAGNOSIS — R42 Dizziness and giddiness: Secondary | ICD-10-CM

## 2021-01-18 DIAGNOSIS — R931 Abnormal findings on diagnostic imaging of heart and coronary circulation: Secondary | ICD-10-CM | POA: Diagnosis not present

## 2021-01-18 DIAGNOSIS — E782 Mixed hyperlipidemia: Secondary | ICD-10-CM | POA: Diagnosis not present

## 2021-01-18 DIAGNOSIS — R55 Syncope and collapse: Secondary | ICD-10-CM

## 2021-01-18 NOTE — Patient Instructions (Signed)
Medication Instructions:  The current medical regimen is effective;  continue present plan and medications.  *If you need a refill on your cardiac medications before your next appointment, please call your pharmacy*   Follow-Up: At [facility], you and your health needs are our priority.  As part of our continuing mission to provide you with exceptional heart care, we have created designated Provider Care Teams.  These Care Teams include your primary Cardiologist (physician) and Advanced Practice Providers (APPs -  Physician Assistants and Nurse Practitioners) who all work together to provide you with the care you need, when you need it.  We recommend signing up for the patient portal called "MyChart".  Sign up information is provided on this After Visit Summary.  MyChart is used to connect with patients for Virtual Visits (Telemedicine).  Patients are able to view lab/test results, encounter notes, upcoming appointments, etc.  Non-urgent messages can be sent to your provider as well.   To learn more about what you can do with MyChart, go to [email or web address].    Your next appointment:   12 month(s)  The format for your next appointment:   In Person  Provider:   You may see Reatha Harps, MD or one of the following Advanced Practice Providers on your designated Care Team:    Azalee Course, PA-C  Micah Flesher, PA-C or   Judy Pimple, [place]    Other Instructions Please drink 60-80 ounces of water a day.

## 2021-01-25 ENCOUNTER — Other Ambulatory Visit: Payer: Self-pay

## 2021-01-25 ENCOUNTER — Encounter: Payer: Self-pay | Admitting: Internal Medicine

## 2021-01-25 ENCOUNTER — Ambulatory Visit: Payer: Medicare HMO | Admitting: Internal Medicine

## 2021-01-25 DIAGNOSIS — H9319 Tinnitus, unspecified ear: Secondary | ICD-10-CM | POA: Diagnosis not present

## 2021-01-25 DIAGNOSIS — R55 Syncope and collapse: Secondary | ICD-10-CM

## 2021-01-25 DIAGNOSIS — F439 Reaction to severe stress, unspecified: Secondary | ICD-10-CM

## 2021-01-25 NOTE — Assessment & Plan Note (Addendum)
Pt had a brain MRI in 2019:  FINDINGS:  # Auditory system: The external, middle and inner ear structures are unremarkable. The visualized cranial nerves are within normal limits. No abnormal enhancement or mass appreciated.  # Skull/marrow/soft tissues: Unremarkable.  # Orbits: Unremarkable.  # Sinuses/mastoid aircells: There is a small, 7 mm, mucus retention cyst in the RIGHT sphenoid sinus. Rest the paranasal sinuses are patent. Small LEFT mastoid effusion.  # Vessels: Normal flow voids within the major intracranial vessels.  # Brain: The parenchyma, sulci, cisterns and ventricles are unremarkable. No acute infarction is identified. No abnormal enhancement. There is no extra-axial fluid collection or intracranial hemorrhage. There is no mass effect or midline shift.  # Additional comments: There is a 3 mm hypointense, nonenhancing lesion in the posterior, superior aspect of the pituitary.    IMPRESSION:  1. Visualized auditory system and cranial nerves are unremarkable.  2. No acute intracranial abnormality.  3. 3 mm cystic lesion in the posterior aspect of the pituitary gland. This could represent a benign cyst versus a cystic microadenoma. MRI of the sella could further evaluate this if clinically warranted.     Electronically Signed by: Ardyth Man   C/o tinnitus - worse, vertigo. Pt saw ENT in the past Audiol ref [facility]

## 2021-01-25 NOTE — Assessment & Plan Note (Signed)
A new spell. ER notes reviewed

## 2021-01-25 NOTE — Assessment & Plan Note (Signed)
Not too bad now

## 2021-01-25 NOTE — Progress Notes (Signed)
Subjective:  Patient ID: Jared Roth, male    DOB: Sep 07, 1950  Age: 70 y.o. MRN: [id number]  CC: Follow-up (6 month f/u)   HPI Jared Roth presents for dyslipidemia, HTN F/u near-syncope on 5/20 - pt went to ER by EMS (he was sweating, threw up once) - ?dehydration. MI was r/o. C/o tinnitus - worse, vertigo. Pt saw ENT in the past  Outpatient Medications Prior to Visit  Medication Sig Dispense Refill  . amLODipine (NORVASC) 5 MG tablet Take 1 tablet (5 mg total) by mouth daily. 90 tablet 3  . b complex vitamins tablet Take 1 tablet by mouth daily. 100 tablet 3  . Cholecalciferol (VITAMIN D3) 2000 units capsule Take 1 capsule (2,000 Units total) by mouth daily. 100 capsule 3  . rosuvastatin (CRESTOR) 40 MG tablet Take 1 tablet (40 mg total) by mouth daily. Annual appt due in June w/labs must see provider for future refills 90 tablet 0  . telmisartan (MICARDIS) 80 MG tablet Take 1 tablet (80 mg total) by mouth daily. Annual appt due in June w/labs must see provider for future refills 90 tablet 0   No facility-administered medications prior to visit.    ROS: Review of Systems  Constitutional: Negative for appetite change, fatigue and unexpected weight change.  HENT: Negative for congestion, nosebleeds, sneezing, sore throat and trouble swallowing.   Eyes: Negative for itching and visual disturbance.  Respiratory: Negative for cough.   Cardiovascular: Negative for chest pain, palpitations and leg swelling.  Gastrointestinal: Negative for abdominal distention, blood in stool, diarrhea and nausea.  Genitourinary: Negative for frequency and hematuria.  Musculoskeletal: Negative for back pain, gait problem, joint swelling and neck pain.  Skin: Negative for rash.  Neurological: Positive for syncope and light-headedness. Negative for dizziness, tremors, speech difficulty and weakness.  Psychiatric/Behavioral: Negative for agitation, dysphoric mood and sleep disturbance. The patient is  not nervous/anxious.     Objective:  BP (!) 148/90 (BP Location: Left Arm)   Pulse (!) 56   Temp 98.1 F (36.7 C) (Oral)   Ht 5\' 6"  (1.676 m)   Wt 168 lb (76.2 kg)   SpO2 98%   BMI 27.12 kg/m   BP Readings from Last 3 Encounters:  01/25/21 (!) 148/90  01/18/21 (!) 146/82  07/25/20 134/86    Wt Readings from Last 3 Encounters:  01/25/21 168 lb (76.2 kg)  01/18/21 170 lb 9.6 oz (77.4 kg)  07/25/20 169 lb 1 oz (76.7 kg)    Physical Exam Constitutional:      General: He is not in acute distress.    Appearance: He is well-developed.     Comments: NAD  Eyes:     Conjunctiva/sclera: Conjunctivae normal.     Pupils: Pupils are equal, round, and reactive to light.  Neck:     Thyroid: No thyromegaly.     Vascular: No JVD.  Cardiovascular:     Rate and Rhythm: Normal rate and regular rhythm.     Heart sounds: Normal heart sounds. No murmur heard. No friction rub. No gallop.   Pulmonary:     Effort: Pulmonary effort is normal. No respiratory distress.     Breath sounds: Normal breath sounds. No wheezing or rales.  Chest:     Chest wall: No tenderness.  Abdominal:     General: Bowel sounds are normal. There is no distension.     Palpations: Abdomen is soft. There is no mass.     Tenderness: There is no abdominal  tenderness. There is no guarding or rebound.  Musculoskeletal:        General: No tenderness. Normal range of motion.     Cervical back: Normal range of motion.  Lymphadenopathy:     Cervical: No cervical adenopathy.  Skin:    General: Skin is warm and dry.     Findings: No rash.  Neurological:     Mental Status: He is alert and oriented to person, place, and time.     Cranial Nerves: No cranial nerve deficit.     Motor: No abnormal muscle tone.     Coordination: Coordination normal.     Gait: Gait normal.     Deep Tendon Reflexes: Reflexes are normal and symmetric.  Psychiatric:        Behavior: Behavior normal.        Thought Content: Thought content  normal.        Judgment: Judgment normal.   Pt saw Dr Flora Lipps last wk - ok  Lab Results  Component Value Date   WBC 4.3 03/22/2020   HGB 12.3 (L) 03/22/2020   HCT 37.9 (L) 03/22/2020   PLT 259 03/22/2020   GLUCOSE 98 03/22/2020   CHOL 141 12/16/2019   TRIG 82 12/16/2019   HDL 58 12/16/2019   LDLDIRECT 172.5 09/02/2006   LDLCALC 67 12/16/2019   ALT 28 01/15/2020   AST 27 01/15/2020   NA 140 03/22/2020   K 4.6 03/22/2020   CL 104 03/22/2020   CREATININE 0.99 03/22/2020   BUN 13 03/22/2020   CO2 26 03/22/2020   TSH 1.40 01/15/2020   PSA 1.64 01/15/2020    CT CORONARY MORPH W/CTA COR W/SCORE W/CA W/CM &/OR WO/CM  Addendum Date: 10/05/2019   ADDENDUM REPORT: 10/05/2019 18:59 CLINICAL DATA:  Chest pain/syncope EXAM: Cardiac/Coronary CTA TECHNIQUE: The patient was scanned on a [employer]. A 100 kV prospective scan was triggered in the descending thoracic aorta at 111 HU's. Axial non-contrast 3 mm slices were carried out through the heart. The data set was analyzed on a dedicated work station and scored using the Agatson method. Gantry rotation speed was 250 msecs and collimation was .6 mm. No beta blockade and 0.8 mg of sl NTG was given. The 3D data set was reconstructed in 5% intervals of the 35-75 % of the R-R cycle. Diastolic phases were analyzed on a dedicated work station using MPR, MIP and VRT modes. The patient received 80 cc of contrast. FINDINGS: Image quality: excellent. Noise artifact is: Limited. Coronary Arteries:  Normal coronary origin.  Right dominance. Left main: The left main is a large caliber vessel with a normal take off from the left coronary cusp that trifurcates into a LAD, LCX, and ramus intermedius. There is no plaque or stenosis. Left anterior descending artery: The proximal LAD is patent without plaque or stenosis. The minimal LAD contains mild non-calcified plaque (<25%). The distal LAD contains mild calcified plaque (25-49%). The first diagonal contains  mild calcified plaque (25-49%). Ramus intermedius: The ramus intermedius contains mild mixed density plaque (25-49%). Left circumflex artery: The LCX is non-dominant. The proximal LCX contains minimal calcified plaque (<25%). There is mild non-calcified plaque (25-49%) in the distal LCX. The LCX gives off 2 patent obtuse marginal branches. Right coronary artery: The RCA is dominant with normal take off from the right coronary cusp. The proximal RCA is patent. The mid RCA contains a mild non-calcified plaque (25-49%). There is artifact in this region due to an acute marginal branch, but  this lesion appears mild. The distal RCA is patent. The RCA terminates as a PDA and right posterolateral branch without evidence of plaque or stenosis. Right Atrium: Right atrial size is within normal limits. Right Ventricle: The right ventricular cavity is within normal limits. Left Atrium: Left atrial size is normal in size with no left atrial appendage filling defect. A small PFO is noted. Left Ventricle: The ventricular cavity size is within normal limits. There are no stigmata of prior infarction. There is no abnormal filling defect. Pulmonary arteries: Normal in size without proximal filling defect. Pulmonary veins: Normal pulmonary venous drainage. Pericardium: Normal thickness with no significant effusion or calcium present. Cardiac valves: The aortic valve is trileaflet without significant calcification. The mitral valve is normal structure without significant calcification. Aorta: Normal caliber with no significant disease. Extra-cardiac findings: See attached radiology report for non-cardiac structures. IMPRESSION: 1. Coronary calcium score of 172. This was 80th percentile for age and sex matched control. 2. Normal coronary origin with right dominance. 3. 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 4. Small PFO noted. RECOMMENDATIONS: 1. Mild non-obstructive CAD (25-49%). Consider non-atherosclerotic causes of  chest pain. Consider preventive therapy and risk factor modification. Lennie Odor, MD Electronically Signed   By: Lennie Odor   On: 10/05/2019 18:59   Result Date: 10/05/2019 EXAM: OVER-READ INTERPRETATION  CT CHEST The following report is an over-read performed by radiologist Dr. Irish Lack of [facility] on 10/05/2019. This over-read does not include interpretation of cardiac or coronary anatomy or pathology. The coronary CTA interpretation by the cardiologist is attached. COMPARISON:  Coronary calcium study on 07/04/2018 FINDINGS: Vascular: No incidental findings. Mediastinum/Nodes: Visualized mediastinum and hilar regions demonstrate no lymphadenopathy or focal masses. Lungs/Pleura: There is no evidence of pulmonary edema, consolidation, pneumothorax, nodule or pleural fluid. Upper Abdomen: No acute abnormality. Musculoskeletal: No chest wall mass or suspicious bone lesions identified. IMPRESSION: No incidental findings. Electronically Signed: By: Irish Lack M.D. On: 10/05/2019 11:38    Assessment & Plan:    Follow-up: No follow-ups on file.  Sonda Primes, MD

## 2021-02-01 ENCOUNTER — Other Ambulatory Visit: Payer: Self-pay | Admitting: Internal Medicine

## 2021-02-09 ENCOUNTER — Other Ambulatory Visit: Payer: Self-pay

## 2021-02-09 ENCOUNTER — Ambulatory Visit (INDEPENDENT_AMBULATORY_CARE_PROVIDER_SITE_OTHER): Payer: Medicare HMO

## 2021-02-09 VITALS — BP 120/70 | HR 55 | Temp 98.2°F | Ht 66.0 in | Wt 170.0 lb

## 2021-02-09 DIAGNOSIS — Z0000 Encounter for general adult medical examination without abnormal findings: Secondary | ICD-10-CM | POA: Diagnosis not present

## 2021-02-09 DIAGNOSIS — Z23 Encounter for immunization: Secondary | ICD-10-CM

## 2021-02-09 NOTE — Patient Instructions (Signed)
[name] , Thank you for taking time to come for your Medicare Wellness Visit. I appreciate your ongoing commitment to your health goals. Please review the following plan we discussed and let me know if I can assist you in the future.   Screening recommendations/referrals: Colonoscopy: 01/27/2016; due every 10 years (due 2027) Recommended yearly ophthalmology/optometry visit for glaucoma screening and checkup Recommended yearly dental visit for hygiene and checkup  Vaccinations: Influenza vaccine: 06/15/2020 Pneumococcal vaccine: 01/13/2020 Tdap vaccine: 12/07/2013; due every 10 years (due 2025) Shingles vaccine: 01/23/2018, 03/12/2018   Covid-19: 09/26/2019, 10/26/2019, 06/30/2020, 12/27/2020  Advanced directives: Please bring a copy of your health care power of attorney and living will to the office at your convenience.  Conditions/risks identified: Continue riding bike for physical exercise and stay hydrated.  Next appointment: Please schedule your next Medicare Wellness Visit with your Nurse Health Advisor in 1 year by calling [phone].  Preventive Care 40 Years and Older, Male Preventive care refers to lifestyle choices and visits with your health care provider that can promote health and wellness. What does preventive care include? A yearly physical exam. This is also called an annual well check. Dental exams once or twice a year. Routine eye exams. Ask your health care provider how often you should have your eyes checked. Personal lifestyle choices, including: Daily care of your teeth and gums. Regular physical activity. Eating a healthy diet. Avoiding tobacco and drug use. Limiting alcohol use. Practicing safe sex. Taking low doses of aspirin every day. Taking vitamin and mineral supplements as recommended by your health care provider. What happens during an annual well check? The services and screenings done by your health care provider during your annual well check will  depend on your age, overall health, lifestyle risk factors, and family history of disease. Counseling  Your health care provider may ask you questions about your: Alcohol use. Tobacco use. Drug use. Emotional well-being. Home and relationship well-being. Sexual activity. Eating habits. History of falls. Memory and ability to understand (cognition). Work and work Astronomer. Screening  You may have the following tests or measurements: Height, weight, and BMI. Blood pressure. Lipid and cholesterol levels. These may be checked every 5 years, or more frequently if you are over 66 years old. Skin check. Lung cancer screening. You may have this screening every year starting at age 61 if you have a 30-pack-year history of smoking and currently smoke or have quit within the past 15 years. Fecal occult blood test (FOBT) of the stool. You may have this test every year starting at age 34. Flexible sigmoidoscopy or colonoscopy. You may have a sigmoidoscopy every 5 years or a colonoscopy every 10 years starting at age 49. Prostate cancer screening. Recommendations will vary depending on your family history and other risks. Hepatitis C blood test. Hepatitis B blood test. Sexually transmitted disease (STD) testing. Diabetes screening. This is done by checking your blood sugar (glucose) after you have not eaten for a while (fasting). You may have this done every 1-3 years. Abdominal aortic aneurysm (AAA) screening. You may need this if you are a current or former smoker. Osteoporosis. You may be screened starting at age 8 if you are at high risk. Talk with your health care provider about your test results, treatment options, and if necessary, the need for more tests. Vaccines  Your health care provider may recommend certain vaccines, such as: Influenza vaccine. This is recommended every year. Tetanus, diphtheria, and acellular pertussis (Tdap, Td) vaccine. You may need  a Td booster every 10  years. Zoster vaccine. You may need this after age 27. Pneumococcal 13-valent conjugate (PCV13) vaccine. One dose is recommended after age 40. Pneumococcal polysaccharide (PPSV23) vaccine. One dose is recommended after age 64. Talk to your health care provider about which screenings and vaccines you need and how often you need them. This information is not intended to replace advice given to you by your health care provider. Make sure you discuss any questions you have with your health care provider. Document Released: 09/09/2015 Document Revised: 05/02/2016 Document Reviewed: 06/14/2015 Elsevier Interactive Patient Education  2017 [place] Prevention in the Home Falls can cause injuries. They can happen to people of all ages. There are many things you can do to make your home safe and to help prevent falls. What can I do on the outside of my home? Regularly fix the edges of walkways and driveways and fix any cracks. Remove anything that might make you trip as you walk through a door, such as a raised step or threshold. Trim any bushes or trees on the path to your home. Use bright outdoor lighting. Clear any walking paths of anything that might make someone trip, such as rocks or tools. Regularly check to see if handrails are loose or broken. Make sure that both sides of any steps have handrails. Any raised decks and porches should have guardrails on the edges. Have any leaves, snow, or ice cleared regularly. Use sand or salt on walking paths during winter. Clean up any spills in your garage right away. This includes oil or grease spills. What can I do in the bathroom? Use night lights. Install grab bars by the toilet and in the tub and shower. Do not use towel bars as grab bars. Use non-skid mats or decals in the tub or shower. If you need to sit down in the shower, use a plastic, non-slip stool. Keep the floor dry. Clean up any water that spills on the floor as soon as it  happens. Remove soap buildup in the tub or shower regularly. Attach bath mats securely with double-sided non-slip rug tape. Do not have throw rugs and other things on the floor that can make you trip. What can I do in the bedroom? Use night lights. Make sure that you have a light by your bed that is easy to reach. Do not use any sheets or blankets that are too big for your bed. They should not hang down onto the floor. Have a firm chair that has side arms. You can use this for support while you get dressed. Do not have throw rugs and other things on the floor that can make you trip. What can I do in the kitchen? Clean up any spills right away. Avoid walking on wet floors. Keep items that you use a lot in easy-to-reach places. If you need to reach something above you, use a strong step stool that has a grab bar. Keep electrical cords out of the way. Do not use floor polish or wax that makes floors slippery. If you must use wax, use non-skid floor wax. Do not have throw rugs and other things on the floor that can make you trip. What can I do with my stairs? Do not leave any items on the stairs. Make sure that there are handrails on both sides of the stairs and use them. Fix handrails that are broken or loose. Make sure that handrails are as long as the stairways. Check  any carpeting to make sure that it is firmly attached to the stairs. Fix any carpet that is loose or worn. Avoid having throw rugs at the top or bottom of the stairs. If you do have throw rugs, attach them to the floor with carpet tape. Make sure that you have a light switch at the top of the stairs and the bottom of the stairs. If you do not have them, ask someone to add them for you. What else can I do to help prevent falls? Wear shoes that: Do not have high heels. Have rubber bottoms. Are comfortable and fit you well. Are closed at the toe. Do not wear sandals. If you use a stepladder: Make sure that it is fully opened.  Do not climb a closed stepladder. Make sure that both sides of the stepladder are locked into place. Ask someone to hold it for you, if possible. Clearly mark and make sure that you can see: Any grab bars or handrails. First and last steps. Where the edge of each step is. Use tools that help you move around (mobility aids) if they are needed. These include: Canes. Walkers. Scooters. Crutches. Turn on the lights when you go into a dark area. Replace any light bulbs as soon as they burn out. Set up your furniture so you have a clear path. Avoid moving your furniture around. If any of your floors are uneven, fix them. If there are any pets around you, be aware of where they are. Review your medicines with your doctor. Some medicines can make you feel dizzy. This can increase your chance of falling. Ask your doctor what other things that you can do to help prevent falls. This information is not intended to replace advice given to you by your health care provider. Make sure you discuss any questions you have with your health care provider. Document Released: 06/09/2009 Document Revised: 01/19/2016 Document Reviewed: 09/17/2014 Elsevier Interactive Patient Education  2017 [employer].

## 2021-02-09 NOTE — Progress Notes (Signed)
Subjective:   Jared Roth is a 70 y.o. male who presents for Medicare Annual/Subsequent preventive examination.  Review of Systems     Cardiac Risk Factors include: advanced age (>34men, >62 women);dyslipidemia;family history of premature cardiovascular disease;hypertension;male gender     Objective:    Today's Vitals   02/09/21 0900 02/09/21 0902  BP:  120/70  Pulse:  (!) 55  Temp:  98.2 F (36.8 C)  TempSrc:  Temporal  SpO2:  99%  Weight:  170 lb (77.1 kg)  Height:  5\' 6"  (1.676 m)  PainSc: 0-No pain 0-No pain   Body mass index is 27.44 kg/m.  Advanced Directives 02/09/2021 01/13/2020  Does Patient Have a Medical Advance Directive? Yes Yes  Type of Advance Directive Living will Living will  Does patient want to make changes to medical advance directive? No - Patient declined No - Patient declined    Current Medications (verified) Outpatient Encounter Medications as of 02/09/2021  Medication Sig   amLODipine (NORVASC) 5 MG tablet Take 1 tablet (5 mg total) by mouth daily. Annual appt due in Dec must see provider for future refills   rosuvastatin (CRESTOR) 40 MG tablet Take 1 tablet (40 mg total) by mouth daily. Annual appt due in Dec must see provider for future refills   telmisartan (MICARDIS) 80 MG tablet Take 1 tablet (80 mg total) by mouth daily. Annual appt due in Dec must see provider for future refills   b complex vitamins tablet Take 1 tablet by mouth daily.   Cholecalciferol (VITAMIN D3) 2000 units capsule Take 1 capsule (2,000 Units total) by mouth daily.   No facility-administered encounter medications on file as of 02/09/2021.    Allergies (verified) Atorvastatin and Sulfonamide derivatives   History: Past Medical History:  Diagnosis Date   Benign prostatic hypertrophy    HSV infection    Hypercholesteremia    Hypertension    Past Surgical History:  Procedure Laterality Date   CYST REMOVAL TRUNK     ELBOW SURGERY     Family History  Problem  Relation Age of Onset   Heart disease Father    Hypertension Mother    Stroke Mother    Cancer Brother        lymphoma or leukemia   Heart disease Sister 53       sudden death   Social History   Socioeconomic History   Marital status: Married    Spouse name: ruth   Number of children: 2   Years of education: Not on file   Highest education level: Not on file  Occupational History   Occupation: retired from [employer]   Occupation: 68 in [place]   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    Comment: [employer]  Tobacco Use   Smoking status: Never   Smokeless tobacco: Never  Substance and Sexual Activity   Alcohol use: Yes    Comment: social use   Drug use: No   Sexual activity: Yes  Other Topics Concern   Not on file  Social History Narrative   Pt has a total of 9 siblings---he is the youngest of 10   Social Determinants of Health   Financial Resource Strain: Low Risk    Difficulty of Paying Living Expenses: Not hard at all  Food Insecurity: No Food Insecurity   Worried About [place] in the Last Year: Never true   Ran Out of Food in the Last Year: Never true  Transportation  Needs: No Transportation Needs   Lack of Transportation (Medical): No   Lack of Transportation (Non-Medical): No  Physical Activity: Sufficiently Active   Days of Exercise per Week: 5 days   Minutes of Exercise per Session: 30 min  Stress: No Stress Concern Present   Feeling of Stress : Not at all  Social Connections: Socially Integrated   Frequency of Communication with Friends and Family: More than three times a week   Frequency of Social Gatherings with Friends and Family: More than three times a week   Attends Religious Services: More than 4 times per year   Active Member of [employer] or Organizations: No   Attends Engineer, structural: More than 4 times per year   Marital Status: Married    Tobacco Counseling Counseling given: Not  Answered   Clinical Intake:  Pre-visit preparation completed: Yes  Pain : No/denies pain Pain Score: 0-No pain     BMI - recorded: 27.44 Nutritional Status: BMI 25 -29 Overweight Nutritional Risks: None Diabetes: No  How often do you need to have someone help you when you read instructions, pamphlets, or other written materials from your doctor or pharmacy?: 1 - Never What is the last grade level you completed in school?: 3 years of college  Diabetic? no  Interpreter Needed?: No  Information entered by :: Susie Cassette, LPN   Activities of Daily Living In your present state of health, do you have any difficulty performing the following activities: 02/09/2021  Hearing? N  Vision? N  Difficulty concentrating or making decisions? N  Walking or climbing stairs? N  Dressing or bathing? N  Doing errands, shopping? N  Preparing Food and eating ? N  Using the Toilet? N  In the past six months, have you accidently leaked urine? N  Do you have problems with loss of bowel control? N  Managing your Medications? N  Managing your Finances? N  Housekeeping or managing your Housekeeping? N  Some recent data might be hidden    Patient Care Team: Plotnikov, Georgina Quint, MD as PCP - General (Internal Medicine) O'Neal, Ronnald Ramp, MD as PCP - Cardiology (Cardiology) Mixon, Kateri Mc (Gastroenterology)  Indicate any recent Medical Services you may have received from other than [facility] providers in the past year (date may be approximate).     Assessment:   This is a routine wellness examination for Jared Roth.  Hearing/Vision screen Hearing Screening - Comments:: Patient does have decreased hearing; has been fitted for hearing aids. Vision Screening - Comments:: Patient denies any vision issues. Not up to date with eye exam.  Dietary issues and exercise activities discussed: Current Exercise Habits: Home exercise routine, Type of exercise: Other - see comments (bike riding 20 miles),  Time (Minutes): 30, Frequency (Times/Week): 5, Weekly Exercise (Minutes/Week): 150, Exercise limited by: None identified   Goals Addressed             This Visit's Progress    DIET - INCREASE WATER INTAKE       Continue to ride bike for physical activity and stay hydrated.       Depression Screen PHQ 2/9 Scores 02/09/2021 01/25/2021 01/13/2020 01/13/2020 06/17/2018 12/18/2017 12/17/2016  PHQ - 2 Score 0 0 0 0 0 0 0  PHQ- 9 Score 0 0 - - - - -    Fall Risk Fall Risk  02/09/2021 01/25/2021 01/13/2020 06/23/2019 06/17/2018  Falls in the past year? 0 0 1 1 No  Number falls in past yr: 0  0 0 0 -  Injury with Fall? 0 0 0 0 -  Risk for fall due to : No Fall Risks No Fall Risks No Fall Risks - -  Follow up Falls evaluation completed - Falls evaluation completed;Education provided;Falls prevention discussed - -    FALL RISK PREVENTION PERTAINING TO THE HOME:  Any stairs in or around the home? Yes  If so, are there any without handrails? No  Home free of loose throw rugs in walkways, pet beds, electrical cords, etc? Yes  Adequate lighting in your home to reduce risk of falls? Yes   ASSISTIVE DEVICES UTILIZED TO PREVENT FALLS:  Life alert? No  Use of a cane, walker or w/c? No  Grab bars in the bathroom? No  Shower chair or bench in shower? No  Elevated toilet seat or a handicapped toilet? No   TIMED UP AND GO:  Was the test performed? Yes .  Length of time to ambulate 10 feet: 6 sec.   Gait steady and fast without use of assistive device  Cognitive Function: Normal cognitive status assessed by direct observation by this Nurse Health Advisor. No abnormalities found.       6CIT Screen 01/13/2020  What Year? 0 points  What month? 0 points  What time? 0 points  Count back from 20 0 points  Months in reverse 0 points  Repeat phrase 0 points  Total Score 0    Immunizations Immunization History  Administered Date(s) Administered   Fluad Quad(high Dose 65+) 04/30/2019, 06/15/2020    Influenza,inj,Quad PF,6+ Mos 06/08/2014, 08/02/2015, 06/18/2017, 04/23/2018   Moderna Sars-Covid-2 Vaccination 09/26/2019, 10/26/2019, 06/30/2020   Pneumococcal Conjugate-13 01/13/2020   Tdap 12/07/2013   Zoster Recombinat (Shingrix) 01/23/2018, 03/12/2018    TDAP status: Up to date  Flu Vaccine status: Up to date  Pneumococcal vaccine status: Up to date  Covid-19 vaccine status: Completed vaccines  Qualifies for Shingles Vaccine? Yes   Zostavax completed Yes   Shingrix Completed?: Yes  Screening Tests Health Maintenance  Topic Date Due   Hepatitis C Screening  Never done   PNA vac Low Risk Adult (2 of 2 - PPSV23) 01/12/2021   INFLUENZA VACCINE  03/27/2021   TETANUS/TDAP  12/08/2023   COLONOSCOPY (Pts [id number] Insurance coverage will need to be confirmed)  01/26/2026   COVID-19 Vaccine  Completed   Zoster Vaccines- Shingrix  Completed   HPV VACCINES  Aged Out    Health Maintenance  Health Maintenance Due  Topic Date Due   Hepatitis C Screening  Never done   PNA vac Low Risk Adult (2 of 2 - PPSV23) 01/12/2021    Colorectal cancer screening: Type of screening: Colonoscopy. Completed 01/27/2016. Repeat every 10 years  Lung Cancer Screening: (Low Dose CT Chest recommended if Age 72-80 years, 30 pack-year currently smoking OR have quit w/in 15years.) does not qualify.   Lung Cancer Screening Referral: no  Additional Screening:  Hepatitis C Screening: does qualify; Completed no  Vision Screening: Recommended annual ophthalmology exams for early detection of glaucoma and other disorders of the eye. Is the patient up to date with their annual eye exam?  No  Who is the provider or what is the name of the office in which the patient attends annual eye exams? Patient refused If pt is not established with a provider, would they like to be referred to a provider to establish care? No .   Dental Screening: Recommended annual dental exams for proper oral hygiene  [employer]  Resource  Referral / Chronic Care Management: CRR required this visit?  No   CCM required this visit?  No      Plan:     I have personally reviewed and noted the following in the patient's chart:   Medical and social history Use of alcohol, tobacco or illicit drugs  Current medications and supplements including opioid prescriptions. Patient is not currently taking opioid prescriptions. Functional ability and status Nutritional status Physical activity Advanced directives List of other physicians Hospitalizations, surgeries, and ER visits in previous 12 months Vitals Screenings to include cognitive, depression, and falls Referrals and appointments  In addition, I have reviewed and discussed with patient certain preventive protocols, quality metrics, and best practice recommendations. A written personalized care plan for preventive services as well as general preventive health recommendations were provided to patient.     Mickeal Needy, LPN   1/61/0960   Nurse Notes:

## 2021-02-10 ENCOUNTER — Telehealth: Payer: Self-pay | Admitting: Internal Medicine

## 2021-02-10 NOTE — Telephone Encounter (Signed)
Patient requesting referral to [facility] Rheumat[facility] Fax [phone] Phone [phone]

## 2021-02-11 NOTE — Telephone Encounter (Signed)
What is the problem?  Thanks 

## 2021-02-13 NOTE — Telephone Encounter (Signed)
Called pt gave him MD response. Pt states he has actually dx himself. Been having problems w/ right wrist based of the thumb clicking. Sore to the touch. He called it trigger thumb. Inform pt due to insurance he will need to see the provider first, and if referral is needed he can do at that time. Made apt for 02/23/21.Marland KitchenRaechel Chute

## 2021-02-23 ENCOUNTER — Ambulatory Visit: Payer: Medicare HMO | Admitting: Internal Medicine

## 2021-03-06 ENCOUNTER — Other Ambulatory Visit: Payer: Self-pay

## 2021-03-06 ENCOUNTER — Encounter: Payer: Self-pay | Admitting: Internal Medicine

## 2021-03-06 ENCOUNTER — Ambulatory Visit (INDEPENDENT_AMBULATORY_CARE_PROVIDER_SITE_OTHER): Payer: Medicare HMO | Admitting: Internal Medicine

## 2021-03-06 DIAGNOSIS — M654 Radial styloid tenosynovitis [de Quervain]: Secondary | ICD-10-CM | POA: Insufficient documentation

## 2021-03-06 MED ORDER — NAPROXEN 500 MG PO TABS: 500.0000 mg | ORAL_TABLET | Freq: Two times a day (BID) | ORAL | 1 refills | Status: DC

## 2021-03-06 NOTE — Assessment & Plan Note (Addendum)
ACE wrap or thumb splint Naproxen po Voltaren gel Will inject if not better

## 2021-03-06 NOTE — Progress Notes (Signed)
Subjective:  Patient ID: Jared Roth, male    DOB: 11-04-1950  Age: 70 y.o. MRN: [id number]  CC: Wrist Pain ((R) wrist pain)   HPI Jared Roth presents for R thumb tendon pain, stiff in am, getting stuck x 3 wks. The pt had it many years ago  Outpatient Medications Prior to Visit  Medication Sig Dispense Refill   amLODipine (NORVASC) 5 MG tablet Take 1 tablet (5 mg total) by mouth daily. Annual appt due in Dec must see provider for future refills 90 tablet 2   rosuvastatin (CRESTOR) 40 MG tablet Take 1 tablet (40 mg total) by mouth daily. Annual appt due in Dec must see provider for future refills 90 tablet 2   telmisartan (MICARDIS) 80 MG tablet Take 1 tablet (80 mg total) by mouth daily. Annual appt due in Dec must see provider for future refills 90 tablet 2   b complex vitamins tablet Take 1 tablet by mouth daily. 100 tablet 3   Cholecalciferol (VITAMIN D3) 2000 units capsule Take 1 capsule (2,000 Units total) by mouth daily. 100 capsule 3   No facility-administered medications prior to visit.    ROS: Review of Systems  Constitutional:  Negative for chills and fever.  Cardiovascular:  Negative for chest pain.  Musculoskeletal:  Negative for arthralgias.  Neurological:  Negative for weakness.   Objective:  BP 138/70 (BP Location: Left Arm)   Pulse (!) 54   Temp 98.3 F (36.8 C) (Oral)   Ht 5\' 6"  (1.676 m)   Wt 171 lb (77.6 kg)   SpO2 97%   BMI 27.60 kg/m   BP Readings from Last 3 Encounters:  03/06/21 138/70  02/09/21 120/70  01/25/21 (!) 148/90    Wt Readings from Last 3 Encounters:  03/06/21 171 lb (77.6 kg)  02/09/21 170 lb (77.1 kg)  01/25/21 168 lb (76.2 kg)    Physical Exam Constitutional:      Appearance: Normal appearance.  Musculoskeletal:        General: Tenderness present.  Neurological:     Mental Status: He is oriented to person, place, and time.     Coordination: Coordination normal.     Gait: Gait normal.  R abd poll longus is very  tender  Lab Results  Component Value Date   WBC 4.3 03/22/2020   HGB 12.3 (L) 03/22/2020   HCT 37.9 (L) 03/22/2020   PLT 259 03/22/2020   GLUCOSE 98 03/22/2020   CHOL 141 12/16/2019   TRIG 82 12/16/2019   HDL 58 12/16/2019   LDLDIRECT 172.5 09/02/2006   LDLCALC 67 12/16/2019   ALT 28 01/15/2020   AST 27 01/15/2020   NA 140 03/22/2020   K 4.6 03/22/2020   CL 104 03/22/2020   CREATININE 0.99 03/22/2020   BUN 13 03/22/2020   CO2 26 03/22/2020   TSH 1.40 01/15/2020   PSA 1.64 01/15/2020    CT CORONARY MORPH W/CTA COR W/SCORE W/CA W/CM &/OR WO/CM  Addendum Date: 10/05/2019   ADDENDUM REPORT: 10/05/2019 18:59 CLINICAL DATA:  Chest pain/syncope EXAM: Cardiac/Coronary CTA TECHNIQUE: The patient was scanned on a 12/03/2019. A 100 kV prospective scan was triggered in the descending thoracic aorta at 111 HU's. Axial non-contrast 3 mm slices were carried out through the heart. The data set was analyzed on a dedicated work station and scored using the Agatson method. Gantry rotation speed was 250 msecs and collimation was .6 mm. No beta blockade and 0.8 mg of sl NTG  was given. The 3D data set was reconstructed in 5% intervals of the 35-75 % of the R-R cycle. Diastolic phases were analyzed on a dedicated work station using MPR, MIP and VRT modes. The patient received 80 cc of contrast. FINDINGS: Image quality: excellent. Noise artifact is: Limited. Coronary Arteries:  Normal coronary origin.  Right dominance. Left main: The left main is a large caliber vessel with a normal take off from the left coronary cusp that trifurcates into a LAD, LCX, and ramus intermedius. There is no plaque or stenosis. Left anterior descending artery: The proximal LAD is patent without plaque or stenosis. The minimal LAD contains mild non-calcified plaque (<25%). The distal LAD contains mild calcified plaque (25-49%). The first diagonal contains mild calcified plaque (25-49%). Ramus intermedius: The ramus  intermedius contains mild mixed density plaque (25-49%). Left circumflex artery: The LCX is non-dominant. The proximal LCX contains minimal calcified plaque (<25%). There is mild non-calcified plaque (25-49%) in the distal LCX. The LCX gives off 2 patent obtuse marginal branches. Right coronary artery: The RCA is dominant with normal take off from the right coronary cusp. The proximal RCA is patent. The mid RCA contains a mild non-calcified plaque (25-49%). There is artifact in this region due to an acute marginal branch, but this lesion appears mild. The distal RCA is patent. The RCA terminates as a PDA and right posterolateral branch without evidence of plaque or stenosis. Right Atrium: Right atrial size is within normal limits. Right Ventricle: The right ventricular cavity is within normal limits. Left Atrium: Left atrial size is normal in size with no left atrial appendage filling defect. A small PFO is noted. Left Ventricle: The ventricular cavity size is within normal limits. There are no stigmata of prior infarction. There is no abnormal filling defect. Pulmonary arteries: Normal in size without proximal filling defect. Pulmonary veins: Normal pulmonary venous drainage. Pericardium: Normal thickness with no significant effusion or calcium present. Cardiac valves: The aortic valve is trileaflet without significant calcification. The mitral valve is normal structure without significant calcification. Aorta: Normal caliber with no significant disease. Extra-cardiac findings: See attached radiology report for non-cardiac structures. IMPRESSION: 1. Coronary calcium score of 172. This was 80th percentile for age and sex matched control. 2. Normal coronary origin with right dominance. 3. 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 4. Small PFO noted. RECOMMENDATIONS: 1. Mild non-obstructive CAD (25-49%). Consider non-atherosclerotic causes of chest pain. Consider preventive therapy and risk factor  modification. Lennie Odor, MD Electronically Signed   By: Lennie Odor   On: 10/05/2019 18:59   Result Date: 10/05/2019 EXAM: OVER-READ INTERPRETATION  CT CHEST The following report is an over-read performed by radiologist Dr. Irish Lack of [facility] on 10/05/2019. This over-read does not include interpretation of cardiac or coronary anatomy or pathology. The coronary CTA interpretation by the cardiologist is attached. COMPARISON:  Coronary calcium study on 07/04/2018 FINDINGS: Vascular: No incidental findings. Mediastinum/Nodes: Visualized mediastinum and hilar regions demonstrate no lymphadenopathy or focal masses. Lungs/Pleura: There is no evidence of pulmonary edema, consolidation, pneumothorax, nodule or pleural fluid. Upper Abdomen: No acute abnormality. Musculoskeletal: No chest wall mass or suspicious bone lesions identified. IMPRESSION: No incidental findings. Electronically Signed: By: Irish Lack M.D. On: 10/05/2019 11:38    Assessment & Plan:   There are no diagnoses linked to this encounter.   No orders of the defined types were placed in this encounter.    Follow-up: No follow-ups on file.  Sonda Primes, MD

## 2021-03-06 NOTE — Patient Instructions (Signed)
Thumb splint Voltaren gel   De Quervain's Tenosynovitis  De Quervain's tenosynovitis is a condition that causes inflammation of the tendon on the thumb side of the wrist. Tendons are cords of tissue that connect bones to muscles. The tendons in the hand pass through a tunnel called a sheath. A slippery layer of tissue (synovium) lets the tendons move smoothly in the sheath. With de Quervain'stenosynovitis, the sheath swells or thickens, causing friction and pain. The condition is also called de Quervain's disease and de Quervain's syndrome.It occurs most often in women who are 66-57 years old. What are the causes? The exact cause of this condition is not known. It may be associated withoveruse of the hand and wrist. What increases the risk? You are more likely to develop this condition if you: Use your hands far more than normal, especially if you repeat certain movements that involve twisting your hand or using a tight grip. Are pregnant. Are a middle-aged woman. Have rheumatoid arthritis. Have diabetes. What are the signs or symptoms? The main symptom of this condition is pain on the thumb side of the wrist. The pain may get worse when you grasp something or turn your wrist. Other symptoms may include: Pain that extends up the forearm. Swelling of your wrist and hand. Trouble moving the thumb and wrist. A sensation of snapping in the wrist. A bump filled with fluid (cyst) in the area of the pain. How is this diagnosed? This condition may be diagnosed based on: Your symptoms and medical history. A physical exam. During the exam, your health care provider may do a simple test Jared Roth test) that involves pulling your thumb and wrist to see if this causes pain. You may also need to have an X-ray or ultrasound. How is this treated? Treatment for this condition may include: Avoiding any activity that causes pain and swelling. Taking medicines. Anti-inflammatory medicines and  corticosteroid injections may be used to reduce inflammation and relieve pain. Wearing a splint. Having surgery. This may be needed if other treatments do not work. Once the pain and swelling have gone down, you may start: Physical therapy. This includes exercises to improve movement and strength in your wrist and thumb. Occupational therapy. This includes adjusting how you move your wrist. Follow these instructions at home: If you have a splint: Wear the splint as told by your health care provider. Remove it only as told by your health care provider. Loosen the splint if your fingers tingle, become numb, or turn cold and blue. Keep the splint clean. If the splint is not waterproof: Do not let it get wet. Cover it with a watertight covering when you take a bath or a shower. Managing pain, stiffness, and swelling  Avoid movements and activities that cause pain and swelling in the wrist area. If directed, put ice on the painful area. This may be helpful after doing activities that involve the sore wrist. To do this: Put ice in a plastic bag. Place a towel between your skin and the bag. Leave the ice on for 20 minutes, 2-3 times a day. Remove the ice if your skin turns bright red. This is very important. If you cannot feel pain, heat, or cold, you have a greater risk of damage to the area. Move your fingers often to reduce stiffness and swelling. Raise (elevate) the injured area above the level of your heart while you are sitting or lying down.  General instructions Return to your normal activities as told by your health  care provider. Ask your health care provider what activities are safe for you. Take over-the-counter and prescription medicines only as told by your health care provider. Keep all follow-up visits. This is important. Contact a health care provider if: Your pain medicine does not help. Your pain gets worse. You develop new symptoms. Summary De Quervain's tenosynovitis  is a condition that causes inflammation of the tendon on the thumb side of the wrist. The condition occurs most often in women who are 20-75 years old. The exact cause of this condition is not known. It may be associated with overuse of the hand and wrist. Treatment starts with avoiding activity that causes pain or swelling in the wrist area. Other treatments may include wearing a splint and taking medicine. Sometimes, surgery is needed. This information is not intended to replace advice given to you by your health care provider. Make sure you discuss any questions you have with your healthcare provider. Document Revised: 11/25/2019 Document Reviewed: 11/25/2019 Elsevier Patient Education  2022 [employer].

## 2021-03-09 ENCOUNTER — Telehealth: Payer: Self-pay | Admitting: Internal Medicine

## 2021-03-09 NOTE — Telephone Encounter (Signed)
Patient is requesting a call back to discuss medications.

## 2021-03-10 NOTE — Telephone Encounter (Signed)
Called pt  he states he saw MD on Monday, but failed to mention that he been having some dizziness. He states he read that on two of his mediations " Telmisartan & Amlodipine" that is a side effect to taking them. Requesting MD recommendations on changing md to something else.Marland KitchenRaechel Chute

## 2021-03-12 NOTE — Telephone Encounter (Signed)
His blood pressure was normal.  He can try to take amlodipine at night and telmisartan in the morning.  Thanks

## 2021-03-13 NOTE — Telephone Encounter (Signed)
Called pt there was no answer LMOM w/MD response../lmb 

## 2021-03-28 DIAGNOSIS — R42 Dizziness and giddiness: Secondary | ICD-10-CM | POA: Diagnosis not present

## 2021-03-28 DIAGNOSIS — H903 Sensorineural hearing loss, bilateral: Secondary | ICD-10-CM | POA: Diagnosis not present

## 2021-04-22 IMAGING — CT CT HEAD W/O CM
3 series · 15 of 44 positions shown, 18 images · non-contrast
Comparison: None.

CLINICAL DATA: Ataxia/vertigo with lightheadedness. Near syncope.
Possible stroke. 4 episodes of vertigo over the past couple months.
Diaphoresis and nausea.

EXAM:
CT HEAD WITHOUT CONTRAST
TECHNIQUE: Contiguous axial images were obtained from the base of the skull
through the vertex without intravenous contrast.

[Series 2: head 5.0 h37s · axial · 0.41mm/px · z∈[+151,+261]mm · 9 of 27 slices shown, 12 images]
[im 3/27  brain]
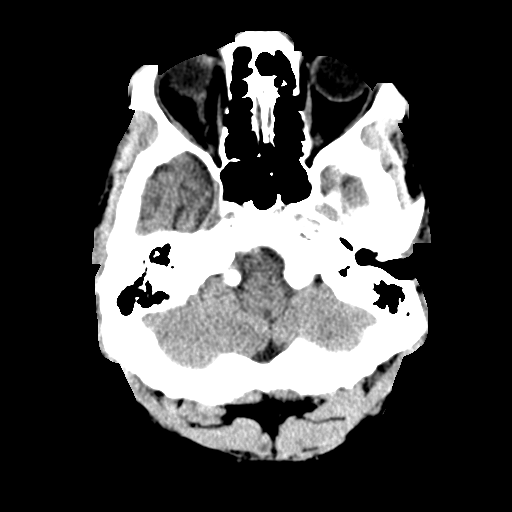
[im 3/27  bone]
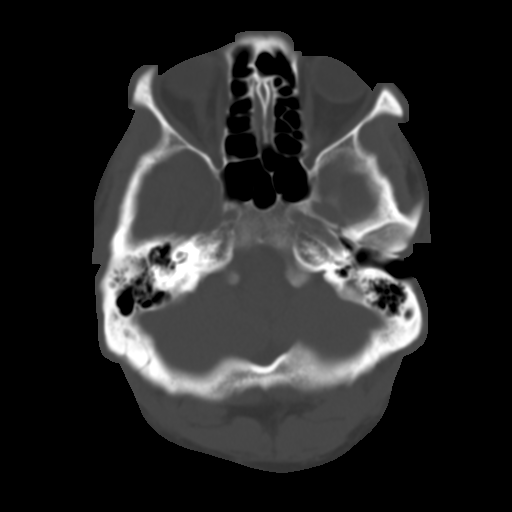
[im 6/27  brain]
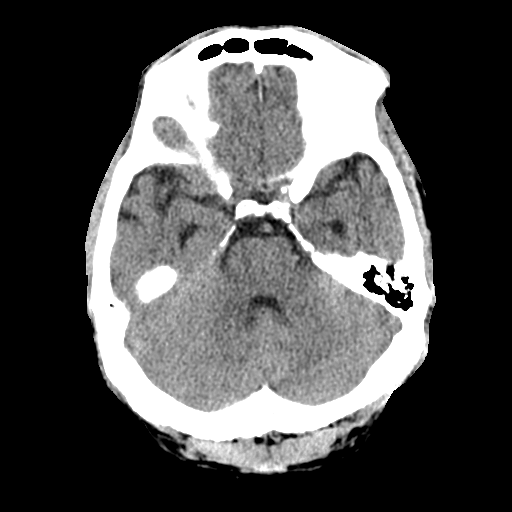
[im 8/27  brain]
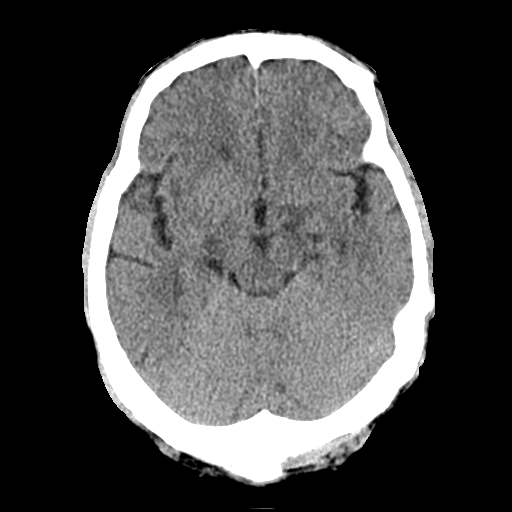
[im 11/27  brain]
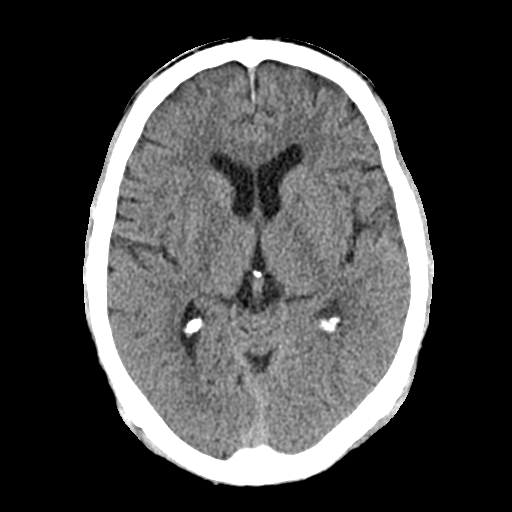
[im 14/27  brain]
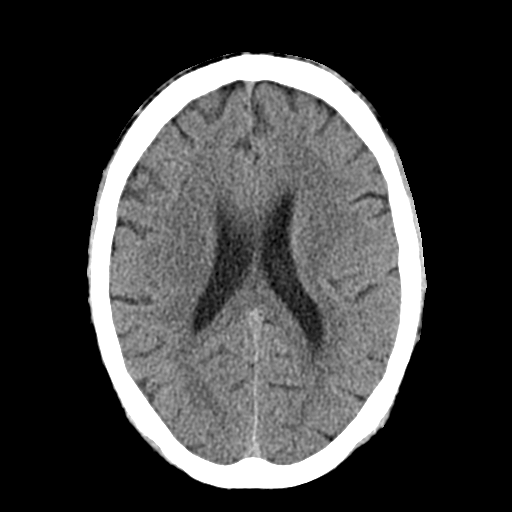
[im 14/27  bone]
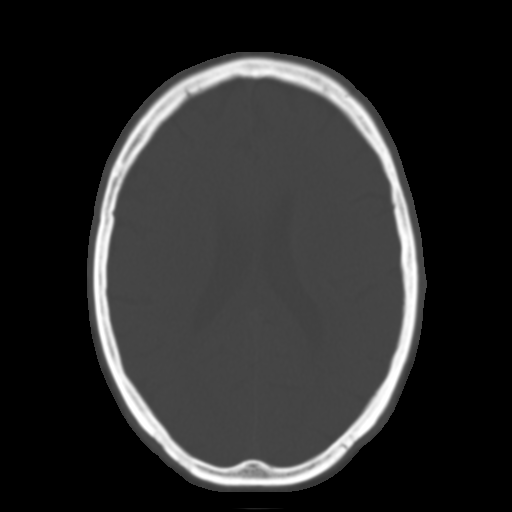
[im 17/27  brain]
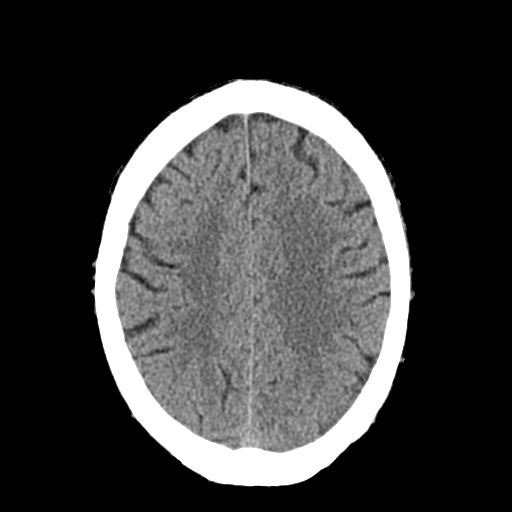
[im 20/27  brain]
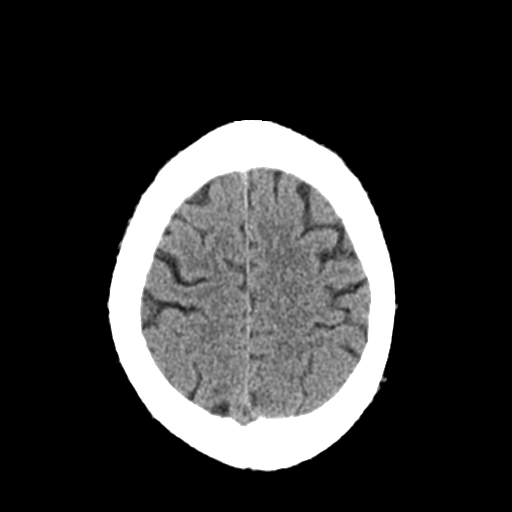
[im 22/27  brain]
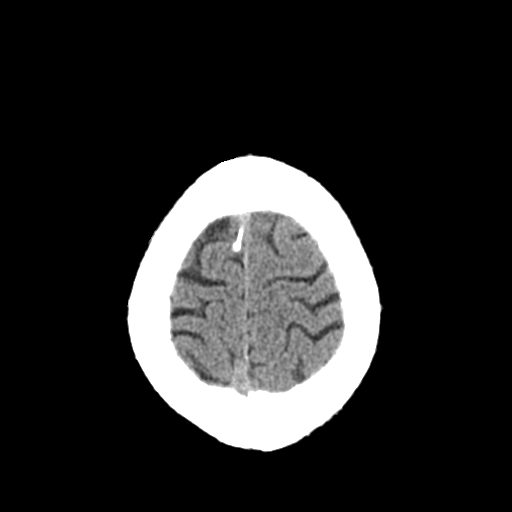
[im 25/27  brain]
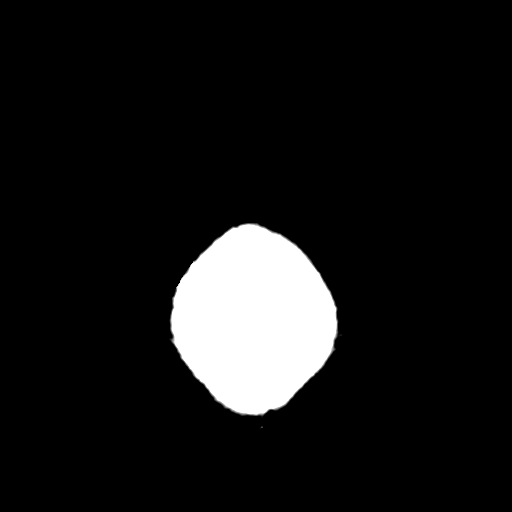
[im 25/27  bone]
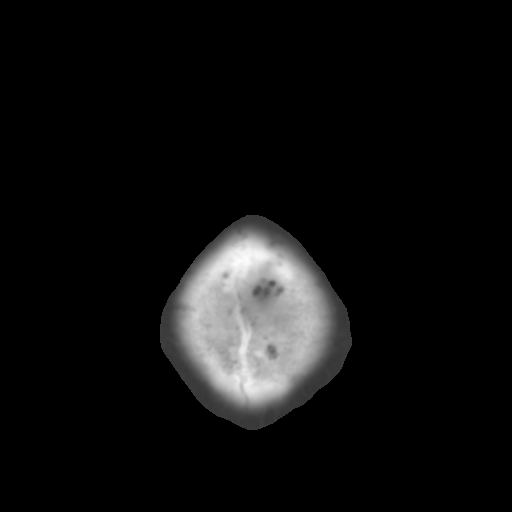

[Series 4: head 3.0 mpr cor · coronal · 0.29mm/px · 3 of 70 slices shown]
[im 24/70  brain]
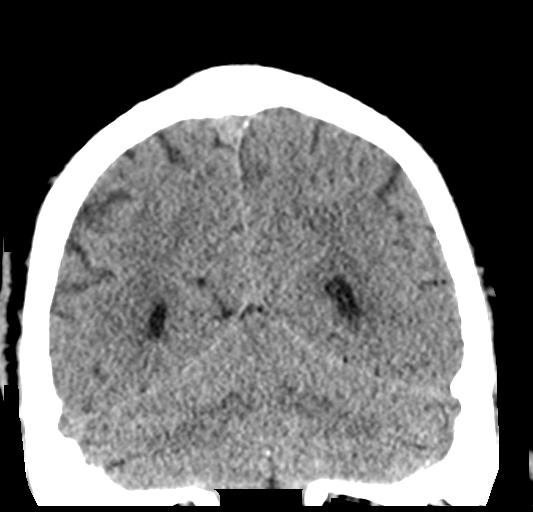
[im 31/70  brain]
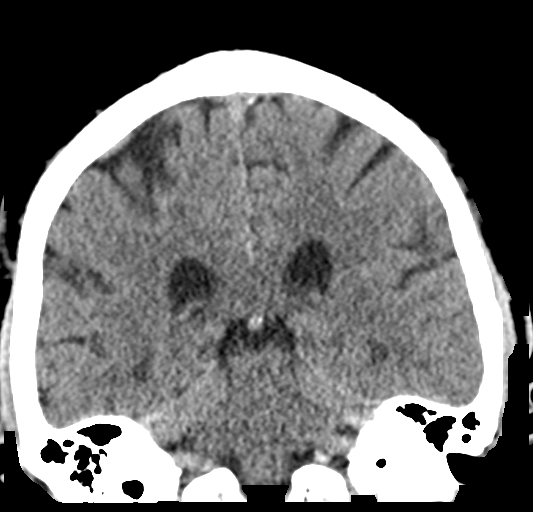
[im 39/70  brain]
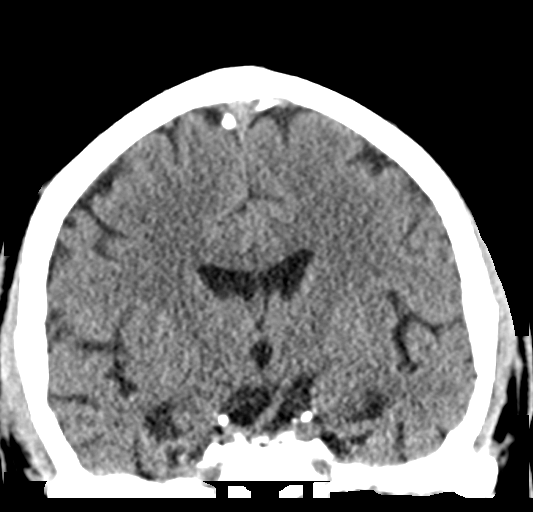

[Series 5: head 3.0 mpr sag · sagittal · 0.26mm/px · 3 of 54 slices shown]
[im 18/54  brain]
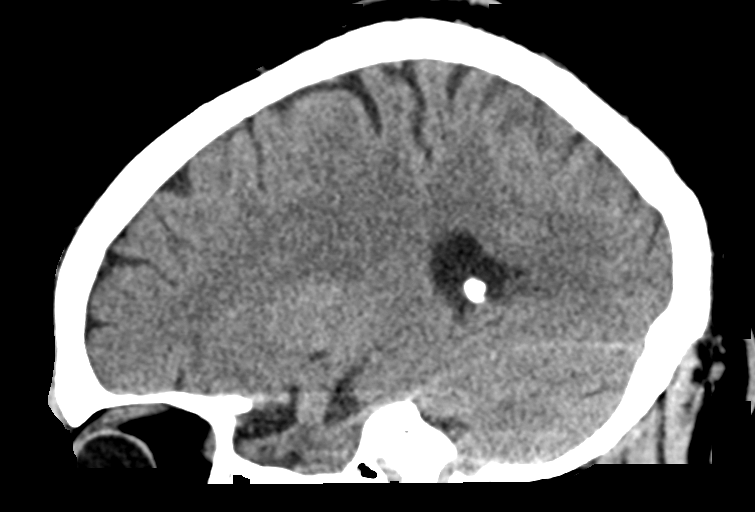
[im 27/54  brain]
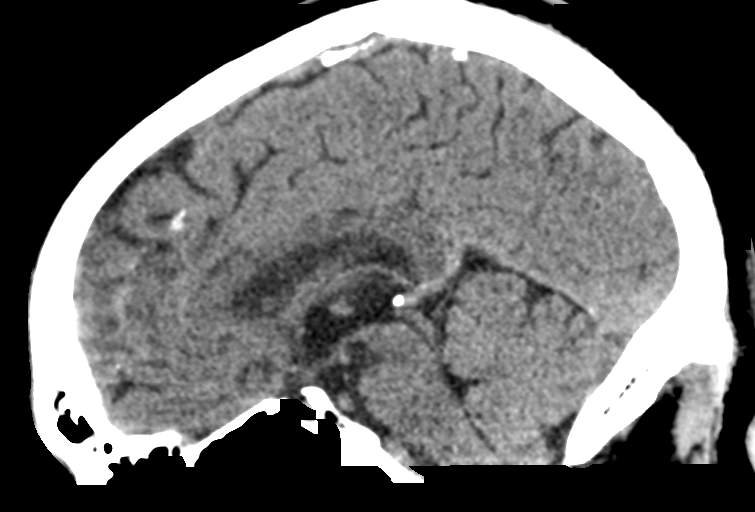
[im 36/54  brain]
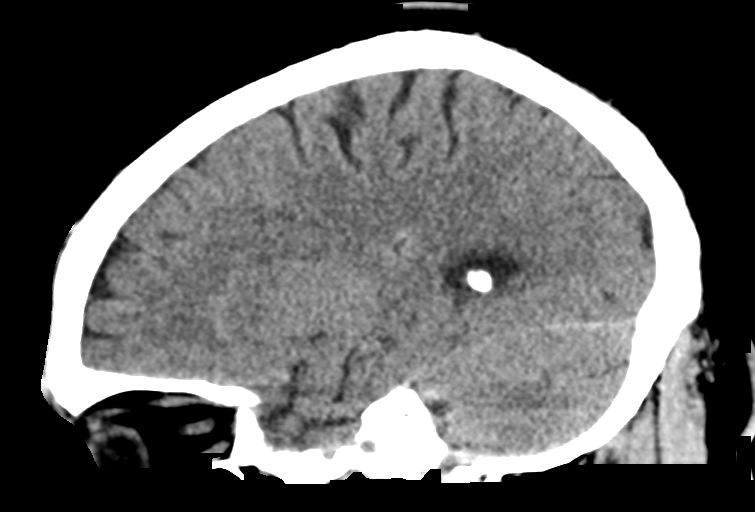

[15 of 44 positions shown; findings below may reference images not displayed]

FINDINGS: Brain: Ventricles, cisterns and other CSF spaces are normal. There
is no mass, mass effect, shift of midline structures or acute
hemorrhage. No evidence of acute infarction.

Vascular: No hyperdense vessel or unexpected calcification.

Skull: Normal. Negative for fracture or focal lesion.

Sinuses/Orbits: No acute finding.

Other: None.
IMPRESSION: No acute findings.

## 2021-07-31 ENCOUNTER — Other Ambulatory Visit: Payer: Self-pay

## 2021-07-31 ENCOUNTER — Encounter: Payer: Self-pay | Admitting: Internal Medicine

## 2021-07-31 ENCOUNTER — Ambulatory Visit (INDEPENDENT_AMBULATORY_CARE_PROVIDER_SITE_OTHER): Payer: Medicare HMO | Admitting: Internal Medicine

## 2021-07-31 VITALS — BP 132/80 | HR 54 | Temp 97.8°F | Ht 66.0 in | Wt 170.6 lb

## 2021-07-31 DIAGNOSIS — Z0000 Encounter for general adult medical examination without abnormal findings: Secondary | ICD-10-CM

## 2021-07-31 DIAGNOSIS — F439 Reaction to severe stress, unspecified: Secondary | ICD-10-CM | POA: Diagnosis not present

## 2021-07-31 DIAGNOSIS — M654 Radial styloid tenosynovitis [de Quervain]: Secondary | ICD-10-CM | POA: Diagnosis not present

## 2021-07-31 DIAGNOSIS — F4321 Adjustment disorder with depressed mood: Secondary | ICD-10-CM | POA: Insufficient documentation

## 2021-07-31 DIAGNOSIS — R972 Elevated prostate specific antigen [PSA]: Secondary | ICD-10-CM

## 2021-07-31 LAB — CBC WITH DIFFERENTIAL/PLATELET
Basophils Absolute: 0 10*3/uL (ref 0.0–0.1)
Basophils Relative: 0.4 % (ref 0.0–3.0)
Eosinophils Absolute: 0.1 10*3/uL (ref 0.0–0.7)
Eosinophils Relative: 1.9 % (ref 0.0–5.0)
HCT: 39.5 % (ref 39.0–52.0)
Hemoglobin: 13 g/dL (ref 13.0–17.0)
Lymphocytes Relative: 42.3 % (ref 12.0–46.0)
Lymphs Abs: 1.5 10*3/uL (ref 0.7–4.0)
MCHC: 32.9 g/dL (ref 30.0–36.0)
MCV: 93.4 fl (ref 78.0–100.0)
Monocytes Absolute: 0.5 10*3/uL (ref 0.1–1.0)
Monocytes Relative: 13.4 % — ABNORMAL HIGH (ref 3.0–12.0)
Neutro Abs: 1.5 10*3/uL (ref 1.4–7.7)
Neutrophils Relative %: 42 % — ABNORMAL LOW (ref 43.0–77.0)
Platelets: 251 10*3/uL (ref 150.0–400.0)
RBC: 4.23 Mil/uL (ref 4.22–5.81)
RDW: 12.6 % (ref 11.5–15.5)
WBC: 3.6 10*3/uL — ABNORMAL LOW (ref 4.0–10.5)

## 2021-07-31 LAB — COMPREHENSIVE METABOLIC PANEL
ALT: 36 U/L (ref 0–53)
AST: 28 U/L (ref 0–37)
Albumin: 4.5 g/dL (ref 3.5–5.2)
Alkaline Phosphatase: 61 U/L (ref 39–117)
BUN: 9 mg/dL (ref 6–23)
CO2: 30 mEq/L (ref 19–32)
Calcium: 9.7 mg/dL (ref 8.4–10.5)
Chloride: 102 mEq/L (ref 96–112)
Creatinine, Ser: 0.96 mg/dL (ref 0.40–1.50)
GFR: 81.43 mL/min (ref >60.00)
Glucose, Bld: 90 mg/dL (ref 70–99)
Potassium: 4.3 mEq/L (ref 3.5–5.1)
Sodium: 140 mEq/L (ref 135–145)
Total Bilirubin: 0.6 mg/dL (ref 0.2–1.2)
Total Protein: 7.7 g/dL (ref 6.0–8.3)

## 2021-07-31 LAB — URINALYSIS
Bilirubin Urine: NEGATIVE
Hgb urine dipstick: NEGATIVE
Ketones, ur: NEGATIVE
Leukocytes,Ua: NEGATIVE
Nitrite: NEGATIVE
Specific Gravity, Urine: <=1.005 — AB (ref 1.000–1.030)
Total Protein, Urine: NEGATIVE
Urine Glucose: NEGATIVE
Urobilinogen, UA: 0.2 (ref 0.0–1.0)
pH: 7.5 (ref 5.0–8.0)

## 2021-07-31 LAB — LIPID PANEL
Cholesterol: 116 mg/dL (ref 0–200)
HDL: 54.9 mg/dL (ref >39.00)
LDL Cholesterol: 49 mg/dL (ref 0–99)
NonHDL: 60.72
Total CHOL/HDL Ratio: 2
Triglycerides: 59 mg/dL (ref 0.0–149.0)
VLDL: 11.8 mg/dL (ref 0.0–40.0)

## 2021-07-31 LAB — TSH: TSH: 1.36 u[IU]/mL (ref 0.35–5.50)

## 2021-07-31 LAB — TESTOSTERONE: Testosterone: 316.26 ng/dL (ref 300.00–890.00)

## 2021-07-31 LAB — PSA: PSA: 1.49 ng/mL (ref 0.10–4.00)

## 2021-07-31 MED ORDER — BUPROPION HCL ER (XL) 150 MG PO TB24: 150.0000 mg | ORAL_TABLET | Freq: Every day | ORAL | 5 refills | Status: DC

## 2021-07-31 NOTE — Assessment & Plan Note (Signed)
Check PSA. ?

## 2021-07-31 NOTE — Assessment & Plan Note (Signed)
Worse Start Wellbutrin XL Risks/benefits discussed Marital counseling pending  Get Talking to strangers by Forde Radon

## 2021-07-31 NOTE — Patient Instructions (Addendum)
Talking to strangers by Forde Radon

## 2021-07-31 NOTE — Assessment & Plan Note (Signed)
  We discussed age appropriate health related issues, including available/recomended screening tests and vaccinations. Labs were ordered to be later reviewed . All questions were answered. We discussed one or more of the following - seat belt use, use of sunscreen/sun exposure exercise, safe sex, fall risk reduction, second hand smoke exposure, firearm use and storage, seat belt use, a need for adhering to healthy diet and exercise. Labs were ordered.  All questions were answered. Colon due in 2027 (last 2017) Dr Gwenevere Abbot

## 2021-07-31 NOTE — Progress Notes (Signed)
Subjective:  Patient ID: Jared Roth, male    DOB: 10-19-1950  Age: 70 y.o. MRN: [id number]  CC: Annual Exam   HPI Markeis BORIS ANGLE presents for a well exam C/o stress at home  Outpatient Medications Prior to Visit  Medication Sig Dispense Refill   amLODipine (NORVASC) 5 MG tablet Take 1 tablet (5 mg total) by mouth daily. Annual appt due in Dec must see provider for future refills 90 tablet 2   b complex vitamins tablet Take 1 tablet by mouth daily. 100 tablet 3   Cholecalciferol (VITAMIN D3) 2000 units capsule Take 1 capsule (2,000 Units total) by mouth daily. 100 capsule 3   rosuvastatin (CRESTOR) 40 MG tablet Take 1 tablet (40 mg total) by mouth daily. Annual appt due in Dec must see provider for future refills 90 tablet 2   telmisartan (MICARDIS) 80 MG tablet Take 1 tablet (80 mg total) by mouth daily. Annual appt due in Dec must see provider for future refills 90 tablet 2   naproxen (NAPROSYN) 500 MG tablet Take 1 tablet (500 mg total) by mouth 2 (two) times daily with a meal. 30 tablet 1   No facility-administered medications prior to visit.    ROS: Review of Systems  Constitutional:  Negative for appetite change, fatigue and unexpected weight change.  HENT:  Negative for congestion, nosebleeds, sneezing, sore throat and trouble swallowing.   Eyes:  Negative for itching and visual disturbance.  Respiratory:  Negative for cough.   Cardiovascular:  Negative for chest pain, palpitations and leg swelling.  Gastrointestinal:  Negative for abdominal distention, blood in stool, diarrhea and nausea.  Genitourinary:  Negative for frequency and hematuria.  Musculoskeletal:  Negative for back pain, gait problem, joint swelling and neck pain.  Skin:  Negative for rash.  Neurological:  Negative for dizziness, tremors, speech difficulty and weakness.  Psychiatric/Behavioral:  Negative for agitation, decreased concentration, dysphoric mood, self-injury, sleep disturbance and suicidal ideas.  The patient is not nervous/anxious.    Objective:  BP 132/80 (BP Location: Left Arm)   Pulse (!) 54   Temp 97.8 F (36.6 C) (Oral)   Ht 5\' 6"  (1.676 m)   Wt 170 lb 9.6 oz (77.4 kg)   SpO2 98%   BMI 27.54 kg/m   BP Readings from Last 3 Encounters:  07/31/21 132/80  03/06/21 138/70  02/09/21 120/70    Wt Readings from Last 3 Encounters:  07/31/21 170 lb 9.6 oz (77.4 kg)  03/06/21 171 lb (77.6 kg)  02/09/21 170 lb (77.1 kg)    Physical Exam Constitutional:      General: He is not in acute distress.    Appearance: He is well-developed.     Comments: NAD  Eyes:     Conjunctiva/sclera: Conjunctivae normal.     Pupils: Pupils are equal, round, and reactive to light.  Neck:     Thyroid: No thyromegaly.     Vascular: No JVD.  Cardiovascular:     Rate and Rhythm: Normal rate and regular rhythm.     Heart sounds: Normal heart sounds. No murmur heard.   No friction rub. No gallop.  Pulmonary:     Effort: Pulmonary effort is normal. No respiratory distress.     Breath sounds: Normal breath sounds. No wheezing or rales.  Chest:     Chest wall: No tenderness.  Abdominal:     General: Bowel sounds are normal. There is no distension.     Palpations: Abdomen is soft. There  is no mass.     Tenderness: There is no abdominal tenderness. There is no guarding or rebound.  Musculoskeletal:        General: No tenderness. Normal range of motion.     Cervical back: Normal range of motion.  Lymphadenopathy:     Cervical: No cervical adenopathy.  Skin:    General: Skin is warm and dry.     Findings: No rash.  Neurological:     Mental Status: He is alert and oriented to person, place, and time.     Cranial Nerves: No cranial nerve deficit.     Motor: No abnormal muscle tone.     Coordination: Coordination normal.     Gait: Gait normal.     Deep Tendon Reflexes: Reflexes are normal and symmetric.  Psychiatric:        Behavior: Behavior normal.        Thought Content: Thought  content normal.        Judgment: Judgment normal.  Prostate/rectal - per Urology  Lab Results  Component Value Date   WBC 4.3 03/22/2020   HGB 12.3 (L) 03/22/2020   HCT 37.9 (L) 03/22/2020   PLT 259 03/22/2020   GLUCOSE 98 03/22/2020   CHOL 141 12/16/2019   TRIG 82 12/16/2019   HDL 58 12/16/2019   LDLDIRECT 172.5 09/02/2006   LDLCALC 67 12/16/2019   ALT 28 01/15/2020   AST 27 01/15/2020   NA 140 03/22/2020   K 4.6 03/22/2020   CL 104 03/22/2020   CREATININE 0.99 03/22/2020   BUN 13 03/22/2020   CO2 26 03/22/2020   TSH 1.40 01/15/2020   PSA 1.64 01/15/2020    CT CORONARY MORPH W/CTA COR W/SCORE W/CA W/CM &/OR WO/CM  Addendum Date: 10/05/2019   ADDENDUM REPORT: 10/05/2019 18:59 CLINICAL DATA:  Chest pain/syncope EXAM: Cardiac/Coronary CTA TECHNIQUE: The patient was scanned on a [employer]. A 100 kV prospective scan was triggered in the descending thoracic aorta at 111 HU's. Axial non-contrast 3 mm slices were carried out through the heart. The data set was analyzed on a dedicated work station and scored using the [place]. Gantry rotation speed was 250 msecs and collimation was .6 mm. No beta blockade and 0.8 mg of sl NTG was given. The 3D data set was reconstructed in 5% intervals of the 35-75 % of the R-R cycle. Diastolic phases were analyzed on a dedicated work station using MPR, MIP and VRT modes. The patient received 80 cc of contrast. FINDINGS: Image quality: excellent. Noise artifact is: Limited. Coronary Arteries:  Normal coronary origin.  Right dominance. Left main: The left main is a large caliber vessel with a normal take off from the left coronary cusp that trifurcates into a LAD, LCX, and ramus intermedius. There is no plaque or stenosis. Left anterior descending artery: The proximal LAD is patent without plaque or stenosis. The minimal LAD contains mild non-calcified plaque (<25%). The distal LAD contains mild calcified plaque (25-49%). The first diagonal  contains mild calcified plaque (25-49%). Ramus intermedius: The ramus intermedius contains mild mixed density plaque (25-49%). Left circumflex artery: The LCX is non-dominant. The proximal LCX contains minimal calcified plaque (<25%). There is mild non-calcified plaque (25-49%) in the distal LCX. The LCX gives off 2 patent obtuse marginal branches. Right coronary artery: The RCA is dominant with normal take off from the right coronary cusp. The proximal RCA is patent. The mid RCA contains a mild non-calcified plaque (25-49%). There is artifact in this region  due to an acute marginal branch, but this lesion appears mild. The distal RCA is patent. The RCA terminates as a PDA and right posterolateral branch without evidence of plaque or stenosis. Right Atrium: Right atrial size is within normal limits. Right Ventricle: The right ventricular cavity is within normal limits. Left Atrium: Left atrial size is normal in size with no left atrial appendage filling defect. A small PFO is noted. Left Ventricle: The ventricular cavity size is within normal limits. There are no stigmata of prior infarction. There is no abnormal filling defect. Pulmonary arteries: Normal in size without proximal filling defect. Pulmonary veins: Normal pulmonary venous drainage. Pericardium: Normal thickness with no significant effusion or calcium present. Cardiac valves: The aortic valve is trileaflet without significant calcification. The mitral valve is normal structure without significant calcification. Aorta: Normal caliber with no significant disease. Extra-cardiac findings: See attached radiology report for non-cardiac structures. IMPRESSION: 1. Coronary calcium score of 172. This was 80th percentile for age and sex matched control. 2. Normal coronary origin with right dominance. 3. 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 4. Small PFO noted. RECOMMENDATIONS: 1. Mild non-obstructive CAD (25-49%). Consider non-atherosclerotic  causes of chest pain. Consider preventive therapy and risk factor modification. Eleonore Chiquito, MD Electronically Signed   By: Eleonore Chiquito   On: 10/05/2019 18:59   Result Date: 10/05/2019 EXAM: OVER-READ INTERPRETATION  CT CHEST The following report is an over-read performed by radiologist Dr. Aletta Edouard of [facility] [place] on 10/05/2019. This over-read does not include interpretation of cardiac or coronary anatomy or pathology. The coronary CTA interpretation by the cardiologist is attached. COMPARISON:  Coronary calcium study on 07/04/2018 FINDINGS: Vascular: No incidental findings. Mediastinum/Nodes: Visualized mediastinum and hilar regions demonstrate no lymphadenopathy or focal masses. Lungs/Pleura: There is no evidence of pulmonary edema, consolidation, pneumothorax, nodule or pleural fluid. Upper Abdomen: No acute abnormality. Musculoskeletal: No chest wall mass or suspicious bone lesions identified. IMPRESSION: No incidental findings. Electronically Signed: By: Aletta Edouard M.D. On: 10/05/2019 11:38    Assessment & Plan:   Problem List Items Addressed This Visit     De Quervain's tenosynovitis, right    90% better      Elevated PSA    Check PSA      Situational depression    Worse Start Wellbutrin XL Risks/benefits discussed Marital counseling pending  Get Talking to strangers by Margarita Sermons      Relevant Medications   buPROPion (WELLBUTRIN XL) 150 MG 24 hr tablet   Other Relevant Orders   Testosterone   Stress at home    Worse Start Wellbutrin XL Risks/benefits discussed Marital counseling pending  Get Talking to strangers by Margarita Sermons      Well adult exam - Primary     We discussed age appropriate health related issues, including available/recomended screening tests and vaccinations. Labs were ordered to be later reviewed . All questions were answered. We discussed one or more of the following - seat belt use, use of sunscreen/sun exposure  exercise, safe sex, fall risk reduction, second hand smoke exposure, firearm use and storage, seat belt use, a need for adhering to healthy diet and exercise. Labs were ordered.  All questions were answered. Colon due in 2027 (last 2017) Dr Arlana Pouch      Relevant Orders   TSH   Urinalysis   CBC with Differential/Platelet   Lipid panel   PSA   Comprehensive metabolic panel      Meds ordered this encounter  Medications  buPROPion (WELLBUTRIN XL) 150 MG 24 hr tablet    Sig: Take 1 tablet (150 mg total) by mouth daily. Take in am    Dispense:  30 tablet    Refill:  5       Follow-up: Return in about 6 months (around 01/29/2022) for a follow-up visit.  Sonda Primes, MD

## 2021-07-31 NOTE — Assessment & Plan Note (Signed)
90% better

## 2021-07-31 NOTE — Assessment & Plan Note (Addendum)
Worse Start Wellbutrin XL Risks/benefits discussed Marital counseling pending  Get Talking to strangers by Forde Radon

## 2021-08-14 ENCOUNTER — Other Ambulatory Visit: Payer: Self-pay | Admitting: Internal Medicine

## 2022-01-22 ENCOUNTER — Other Ambulatory Visit: Payer: Self-pay | Admitting: Internal Medicine

## 2022-02-19 ENCOUNTER — Ambulatory Visit (INDEPENDENT_AMBULATORY_CARE_PROVIDER_SITE_OTHER): Payer: Medicare HMO

## 2022-02-19 DIAGNOSIS — Z0100 Encounter for examination of eyes and vision without abnormal findings: Secondary | ICD-10-CM

## 2022-02-19 DIAGNOSIS — Z0000 Encounter for general adult medical examination without abnormal findings: Secondary | ICD-10-CM | POA: Diagnosis not present

## 2022-02-19 NOTE — Progress Notes (Addendum)
Subjective:   Jared Roth is a 71 y.o. male who presents for an Initial Medicare Annual Wellness Visit.   I connected with Jared Roth today by telephone and verified that I am speaking with the correct person using two identifiers. Location patient: home Location provider: work Persons participating in the virtual visit: patient, provider.   I discussed the limitations, risks, security and privacy concerns of performing an evaluation and management service by telephone and the availability of in person appointments. I also discussed with the patient that there may be a patient responsible charge related to this service. The patient expressed understanding and verbally consented to this telephonic visit.    Interactive audio and video telecommunications were attempted between this provider and patient, however failed, due to patient having technical difficulties OR patient did not have access to video capability.  We continued and completed visit with audio only.    Review of Systems           Objective:    Today's Vitals   There is no height or weight on file to calculate BMI.     02/19/2022    8:54 AM 02/09/2021    9:38 AM 01/13/2020    8:26 AM  Advanced Directives  Does Patient Have a Medical Advance Directive? No Yes Yes  Type of Advance Directive  Living will Living will  Does patient want to make changes to medical advance directive?  No - Patient declined No - Patient declined  Would patient like information on creating a medical advance directive? No - Patient declined      Current Medications (verified) Outpatient Encounter Medications as of 02/19/2022  Medication Sig   amLODipine (NORVASC) 5 MG tablet Take 1 tablet (5 mg total) by mouth daily. Annual appt due in Dec must see provider for future refills   b complex vitamins tablet Take 1 tablet by mouth daily.   Cholecalciferol (VITAMIN D3) 2000 units capsule Take 1 capsule (2,000 Units total) by mouth daily.    rosuvastatin (CRESTOR) 40 MG tablet Take 1 tablet (40 mg total) by mouth daily. Annual appt due in Dec must see provider for future refills   telmisartan (MICARDIS) 80 MG tablet TAKE 1 TABLET EVERY DAY (ANNUAL APPT DUE IN DEC MUST SEE PROVIDER FOR FURTHER REFILLS)   buPROPion (WELLBUTRIN XL) 150 MG 24 hr tablet TAKE 1 TABLET (150 MG TOTAL) BY MOUTH DAILY. TAKE IN AM (Patient not taking: Reported on 02/19/2022)   No facility-administered encounter medications on file as of 02/19/2022.    Allergies (verified) Atorvastatin and Sulfonamide derivatives   History: Past Medical History:  Diagnosis Date   Benign prostatic hypertrophy    HSV infection    Hypercholesteremia    Hypertension    Past Surgical History:  Procedure Laterality Date   CYST REMOVAL TRUNK     ELBOW SURGERY     Family History  Problem Relation Age of Onset   Heart disease Father    Hypertension Mother    Stroke Mother    Cancer Brother        lymphoma or leukemia   Heart disease Sister 75       sudden death   Social History   Socioeconomic History   Marital status: Married    Spouse name: ruth   Number of children: 2   Years of education: Not on file   Highest education level: Not on file  Occupational History   Occupation: retired from [employer]  Occupation: Equities trader in [place]   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    Comment: [name]  Tobacco Use   Smoking status: Never   Smokeless tobacco: Never  Substance and Sexual Activity   Alcohol use: Yes    Comment: social use   Drug use: No   Sexual activity: Yes  Other Topics Concern   Not on file  Social History Narrative   Pt has a total of 9 siblings---he is the youngest of 10   Social Determinants of Health   Financial Resource Strain: Low Risk  (02/19/2022)   Overall Financial Resource Strain (CARDIA)    Difficulty of Paying Living Expenses: Not hard at all  Food Insecurity: No Food Insecurity (02/19/2022)   Hunger Vital  Sign    Worried About Running Out of Food in the Last Year: Never true    Ran Out of Food in the Last Year: Never true  Transportation Needs: No Transportation Needs (02/19/2022)   PRAPARE - Administrator, Civil Service (Medical): No    Lack of Transportation (Non-Medical): No  Physical Activity: Sufficiently Active (02/19/2022)   Exercise Vital Sign    Days of Exercise per Week: 5 days    Minutes of Exercise per Session: 60 min  Stress: No Stress Concern Present (02/19/2022)   [employer] of Occupational Health - Occupational Stress Questionnaire    Feeling of Stress : Not at all  Social Connections: Moderately Integrated (02/19/2022)   Social Connection and Isolation Panel [NHANES]    Frequency of Communication with Friends and Family: Three times a week    Frequency of Social Gatherings with Friends and Family: Three times a week    Attends Religious Services: 1 to 4 times per year    Active Member of Clubs or Organizations: No    Attends Banker Meetings: Never    Marital Status: Married    Tobacco Counseling Counseling given: Not Answered   Clinical Intake:  Pre-visit preparation completed: Yes  Pain : No/denies pain     Nutritional Risks: None Diabetes: No  How often do you need to have someone help you when you read instructions, pamphlets, or other written materials from your doctor or pharmacy?: 1 - Never What is the last grade level you completed in school?: college  Diabetic?no   Interpreter Needed?: No  Information entered by :: L.Elyanna Wallick,LPn   Activities of Daily Living     No data to display          Patient Care Team: Plotnikov, Georgina Quint, MD as PCP - General (Internal Medicine) O'Neal, Ronnald Ramp, MD as PCP - Cardiology (Cardiology) Mixon, Kateri Mc (Gastroenterology)  Indicate any recent Medical Services you may have received from other than [facility] providers in the past year (date may be approximate).      Assessment:   This is a routine wellness examination for Korrey.  Hearing/Vision screen Vision Screening - Comments:: Referral 023/26/2023  Dietary issues and exercise activities discussed:     Goals Addressed             This Visit's Progress    DIET - INCREASE WATER INTAKE   On track    Continue to ride bike for physical activity and stay hydrated.       Depression Screen    02/19/2022    8:57 AM 02/19/2022    8:52 AM 07/31/2021    8:47 AM 02/09/2021    9:01 AM 01/25/2021   10:34  AM 01/13/2020    8:27 AM 01/13/2020    8:24 AM  PHQ 2/9 Scores  PHQ - 2 Score 0 0 0 0 0 0 0  PHQ- 9 Score   0 0 0      Fall Risk    02/19/2022    8:57 AM 07/31/2021    8:46 AM 02/09/2021    9:01 AM 01/25/2021   10:34 AM 01/13/2020    8:26 AM  Fall Risk   Falls in the past year? 0 1 0 0 1  Number falls in past yr: 0 0 0 0 0  Comment  pt states he was having a dizzy spell, fell once but did not get hurt     Injury with Fall? 0 0 0 0 0  Risk for fall due to : No Fall Risks No Fall Risks No Fall Risks No Fall Risks No Fall Risks  Follow up Falls evaluation completed  Falls evaluation completed  Falls evaluation completed;Education provided;Falls prevention discussed    FALL RISK PREVENTION PERTAINING TO THE HOME:  Any stairs in or around the home? Yes  If so, are there any without handrails? No  Home free of loose throw rugs in walkways, pet beds, electrical cords, etc? Yes  Adequate lighting in your home to reduce risk of falls? Yes   ASSISTIVE DEVICES UTILIZED TO PREVENT FALLS:  Life alert? No  Use of a cane, walker or w/c? No  Grab bars in the bathroom? No  Shower chair or bench in shower? No  Elevated toilet seat or a handicapped toilet? No   Cognitive Function:  Normal cognitive status assessed by telephone conversation by this Nurse Health Advisor. No abnormalities found.        01/13/2020    8:28 AM  6CIT Screen  What Year? 0 points  What month? 0 points  What time? 0 points   Count back from 20 0 points  Months in reverse 0 points  Repeat phrase 0 points  Total Score 0 points    Immunizations Immunization History  Administered Date(s) Administered   Fluad Quad(high Dose 65+) 04/30/2019, 06/15/2020   Influenza, High Dose Seasonal PF 06/27/2021   Influenza,inj,Quad PF,6+ Mos 06/08/2014, 08/02/2015, 06/18/2017, 04/23/2018   Moderna Sars-Covid-2 Vaccination 09/26/2019, 10/26/2019, 06/30/2020, 12/27/2020   Pneumococcal Conjugate-13 01/13/2020   Pneumococcal Polysaccharide-23 02/09/2021   Tdap 12/07/2013   Zoster Recombinat (Shingrix) 01/23/2018, 03/12/2018     TDAP status: Up to date  Flu Vaccine status: Up to date  Pneumococcal vaccine status: Up to date  Covid-19 vaccine status: Completed vaccines  Qualifies for Shingles Vaccine? Yes   Zostavax completed Yes   Shingrix Completed?: Yes  Screening Tests Health Maintenance  Topic Date Due   Hepatitis C Screening  Never done   COVID-19 Vaccine (5 - Booster for Moderna series) 02/21/2021   INFLUENZA VACCINE  03/27/2022   TETANUS/TDAP  12/08/2023   COLONOSCOPY (Pts 45-27yrs Insurance coverage will need to be confirmed)  01/26/2026   Pneumonia Vaccine 21+ Years old  Completed   Zoster Vaccines- Shingrix  Completed   HPV VACCINES  Aged Out    Health Maintenance  Health Maintenance Due  Topic Date Due   Hepatitis C Screening  Never done   COVID-19 Vaccine (5 - Booster for Moderna series) 02/21/2021    Colorectal cancer screening: Type of screening: Colonoscopy. Completed 01/27/2016. Repeat every 10 years  Lung Cancer Screening: (Low Dose CT Chest recommended if Age 20-80 years, 30 pack-year currently smoking OR have  quit w/in 15years.) does not qualify.   Lung Cancer Screening Referral: n/a  Additional Screening:  Hepatitis C Screening: does not qualify;   Vision Screening: Recommended annual ophthalmology exams for early detection of glaucoma and other disorders of the eye. Is the  patient up to date with their annual eye exam?  No  Who is the provider or what is the name of the office in which the patient attends annual eye exams? Referral 02/19/2022 If pt is not established with a provider, would they like to be referred to a provider to establish care? No .   Dental Screening: Recommended annual dental exams for proper oral hygiene  Community Resource Referral / Chronic Care Management: CRR required this visit?  No   CCM required this visit?  No      Plan:     I have personally reviewed and noted the following in the patient's chart:   Medical and social history Use of alcohol, tobacco or illicit drugs  Current medications and supplements including opioid prescriptions. Patient is not currently taking opioid prescriptions. Functional ability and status Nutritional status Physical activity Advanced directives List of other physicians Hospitalizations, surgeries, and ER visits in previous 12 months Vitals Screenings to include cognitive, depression, and falls Referrals and appointments  In addition, I have reviewed and discussed with patient certain preventive protocols, quality metrics, and best practice recommendations. A written personalized care plan for preventive services as well as general preventive health recommendations were provided to patient.     March Rummage, LPN   [id number]/[id number]   Nurse Notes: none    Medical screening examination/treatment/procedure(s) were performed by non-physician practitioner and as supervising physician I was immediately available for consultation/collaboration.  I agree with above. Jacinta Shoe, MD

## 2022-02-20 ENCOUNTER — Ambulatory Visit (INDEPENDENT_AMBULATORY_CARE_PROVIDER_SITE_OTHER): Payer: Medicare HMO | Admitting: Internal Medicine

## 2022-02-20 ENCOUNTER — Encounter: Payer: Self-pay | Admitting: Internal Medicine

## 2022-02-20 VITALS — BP 122/80 | HR 55 | Temp 97.8°F | Ht 66.0 in | Wt 168.0 lb

## 2022-02-20 DIAGNOSIS — Z0000 Encounter for general adult medical examination without abnormal findings: Secondary | ICD-10-CM

## 2022-02-20 DIAGNOSIS — F4321 Adjustment disorder with depressed mood: Secondary | ICD-10-CM | POA: Diagnosis not present

## 2022-02-20 DIAGNOSIS — I10 Essential (primary) hypertension: Secondary | ICD-10-CM

## 2022-02-20 DIAGNOSIS — M25512 Pain in left shoulder: Secondary | ICD-10-CM | POA: Diagnosis not present

## 2022-02-20 DIAGNOSIS — F439 Reaction to severe stress, unspecified: Secondary | ICD-10-CM

## 2022-02-20 DIAGNOSIS — M25519 Pain in unspecified shoulder: Secondary | ICD-10-CM | POA: Insufficient documentation

## 2022-02-20 MED ORDER — MELOXICAM 15 MG PO TABS: 15.0000 mg | ORAL_TABLET | Freq: Every day | ORAL | 1 refills | Status: DC

## 2022-02-20 NOTE — Assessment & Plan Note (Signed)
M-in-law died in 2023, f-in-law is 21

## 2022-02-20 NOTE — Assessment & Plan Note (Signed)
M-in-law died in 2023, f-in-law is 30  discussed

## 2022-02-20 NOTE — Progress Notes (Signed)
Subjective:  Patient ID: Jared Roth, male    DOB: Jun 14, 1951  Age: 71 y.o. MRN: [id number]  CC: No chief complaint on file.   HPI Jared Roth presents for L shoulder pain x 1-2 months F/u HTN, stress  Outpatient Medications Prior to Visit  Medication Sig Dispense Refill   amLODipine (NORVASC) 5 MG tablet Take 1 tablet (5 mg total) by mouth daily. Annual appt due in Dec must see provider for future refills 90 tablet 2   b complex vitamins tablet Take 1 tablet by mouth daily. 100 tablet 3   Cholecalciferol (VITAMIN D3) 2000 units capsule Take 1 capsule (2,000 Units total) by mouth daily. 100 capsule 3   rosuvastatin (CRESTOR) 40 MG tablet Take 1 tablet (40 mg total) by mouth daily. Annual appt due in Dec must see provider for future refills 90 tablet 2   telmisartan (MICARDIS) 80 MG tablet TAKE 1 TABLET EVERY DAY (ANNUAL APPT DUE IN DEC MUST SEE PROVIDER FOR FURTHER REFILLS) 90 tablet 2   buPROPion (WELLBUTRIN XL) 150 MG 24 hr tablet TAKE 1 TABLET (150 MG TOTAL) BY MOUTH DAILY. TAKE IN AM (Patient not taking: Reported on 02/19/2022) 90 tablet 2   No facility-administered medications prior to visit.    ROS: Review of Systems  Constitutional:  Negative for appetite change, fatigue and unexpected weight change.  HENT:  Negative for congestion, nosebleeds, sneezing, sore throat and trouble swallowing.   Eyes:  Negative for itching and visual disturbance.  Respiratory:  Negative for cough.   Cardiovascular:  Negative for chest pain, palpitations and leg swelling.  Gastrointestinal:  Negative for abdominal distention, blood in stool, diarrhea and nausea.  Genitourinary:  Negative for frequency and hematuria.  Musculoskeletal:  Positive for arthralgias. Negative for back pain, gait problem, joint swelling and neck pain.  Skin:  Negative for rash.  Neurological:  Negative for dizziness, tremors, speech difficulty and weakness.  Psychiatric/Behavioral:  Negative for agitation, dysphoric  mood, sleep disturbance and suicidal ideas. The patient is not nervous/anxious.     Objective:  BP 122/80 (BP Location: Left Arm, Patient Position: Sitting, Cuff Size: Normal)   Pulse (!) 55   Temp 97.8 F (36.6 C) (Oral)   Ht 5\' 6"  (1.676 m)   Wt 168 lb (76.2 kg)   SpO2 98%   BMI 27.12 kg/m   BP Readings from Last 3 Encounters:  02/20/22 122/80  07/31/21 132/80  03/06/21 138/70    Wt Readings from Last 3 Encounters:  02/20/22 168 lb (76.2 kg)  07/31/21 170 lb 9.6 oz (77.4 kg)  03/06/21 171 lb (77.6 kg)    Physical Exam Constitutional:      General: He is not in acute distress.    Appearance: Normal appearance. He is well-developed.     Comments: NAD  Eyes:     Conjunctiva/sclera: Conjunctivae normal.     Pupils: Pupils are equal, round, and reactive to light.  Neck:     Thyroid: No thyromegaly.     Vascular: No JVD.  Cardiovascular:     Rate and Rhythm: Normal rate and regular rhythm.     Heart sounds: Normal heart sounds. No murmur heard.    No friction rub. No gallop.  Pulmonary:     Effort: Pulmonary effort is normal. No respiratory distress.     Breath sounds: Normal breath sounds. No wheezing or rales.  Chest:     Chest wall: No tenderness.  Abdominal:     General: Bowel sounds  are normal. There is no distension.     Palpations: Abdomen is soft. There is no mass.     Tenderness: There is no abdominal tenderness. There is no guarding or rebound.  Musculoskeletal:        General: Tenderness present. Normal range of motion.     Cervical back: Normal range of motion.  Lymphadenopathy:     Cervical: No cervical adenopathy.  Skin:    General: Skin is warm and dry.     Findings: No rash.  Neurological:     Mental Status: He is alert and oriented to person, place, and time.     Cranial Nerves: No cranial nerve deficit.     Motor: No abnormal muscle tone.     Coordination: Coordination normal.     Gait: Gait normal.     Deep Tendon Reflexes: Reflexes are  normal and symmetric.  Psychiatric:        Behavior: Behavior normal.        Thought Content: Thought content normal.        Judgment: Judgment normal.   L subacr pain  Lab Results  Component Value Date   WBC 3.6 (L) 07/31/2021   HGB 13.0 07/31/2021   HCT 39.5 07/31/2021   PLT 251.0 07/31/2021   GLUCOSE 90 07/31/2021   CHOL 116 07/31/2021   TRIG 59.0 07/31/2021   HDL 54.90 07/31/2021   LDLDIRECT 172.5 09/02/2006   LDLCALC 49 07/31/2021   ALT 36 07/31/2021   AST 28 07/31/2021   NA 140 07/31/2021   K 4.3 07/31/2021   CL 102 07/31/2021   CREATININE 0.96 07/31/2021   BUN 9 07/31/2021   CO2 30 07/31/2021   TSH 1.36 07/31/2021   PSA 1.49 07/31/2021    CT CORONARY MORPH W/CTA COR W/SCORE W/CA W/CM &/OR WO/CM  Addendum Date: 10/05/2019   ADDENDUM REPORT: 10/05/2019 18:59 CLINICAL DATA:  Chest pain/syncope EXAM: Cardiac/Coronary CTA TECHNIQUE: The patient was scanned on a [employer]. A 100 kV prospective scan was triggered in the descending thoracic aorta at 111 HU's. Axial non-contrast 3 mm slices were carried out through the heart. The data set was analyzed on a dedicated work station and scored using the Agatson method. Gantry rotation speed was 250 msecs and collimation was .6 mm. No beta blockade and 0.8 mg of sl NTG was given. The 3D data set was reconstructed in 5% intervals of the 35-75 % of the R-R cycle. Diastolic phases were analyzed on a dedicated work station using MPR, MIP and VRT modes. The patient received 80 cc of contrast. FINDINGS: Image quality: excellent. Noise artifact is: Limited. Coronary Arteries:  Normal coronary origin.  Right dominance. Left main: The left main is a large caliber vessel with a normal take off from the left coronary cusp that trifurcates into a LAD, LCX, and ramus intermedius. There is no plaque or stenosis. Left anterior descending artery: The proximal LAD is patent without plaque or stenosis. The minimal LAD contains mild non-calcified  plaque (<25%). The distal LAD contains mild calcified plaque (25-49%). The first diagonal contains mild calcified plaque (25-49%). Ramus intermedius: The ramus intermedius contains mild mixed density plaque (25-49%). Left circumflex artery: The LCX is non-dominant. The proximal LCX contains minimal calcified plaque (<25%). There is mild non-calcified plaque (25-49%) in the distal LCX. The LCX gives off 2 patent obtuse marginal branches. Right coronary artery: The RCA is dominant with normal take off from the right coronary cusp. The proximal RCA is patent. The  mid RCA contains a mild non-calcified plaque (25-49%). There is artifact in this region due to an acute marginal branch, but this lesion appears mild. The distal RCA is patent. The RCA terminates as a PDA and right posterolateral branch without evidence of plaque or stenosis. Right Atrium: Right atrial size is within normal limits. Right Ventricle: The right ventricular cavity is within normal limits. Left Atrium: Left atrial size is normal in size with no left atrial appendage filling defect. A small PFO is noted. Left Ventricle: The ventricular cavity size is within normal limits. There are no stigmata of prior infarction. There is no abnormal filling defect. Pulmonary arteries: Normal in size without proximal filling defect. Pulmonary veins: Normal pulmonary venous drainage. Pericardium: Normal thickness with no significant effusion or calcium present. Cardiac valves: The aortic valve is trileaflet without significant calcification. The mitral valve is normal structure without significant calcification. Aorta: Normal caliber with no significant disease. Extra-cardiac findings: See attached radiology report for non-cardiac structures. IMPRESSION: 1. Coronary calcium score of 172. This was 80th percentile for age and sex matched control. 2. Normal coronary origin with right dominance. 3. 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 4. Small PFO  noted. RECOMMENDATIONS: 1. Mild non-obstructive CAD (25-49%). Consider non-atherosclerotic causes of chest pain. Consider preventive therapy and risk factor modification. Lennie Odor, MD Electronically Signed   By: Lennie Odor   On: 10/05/2019 18:59   Result Date: 10/05/2019 EXAM: OVER-READ INTERPRETATION  CT CHEST The following report is an over-read performed by radiologist Dr. Irish Lack of [facility] on 10/05/2019. This over-read does not include interpretation of cardiac or coronary anatomy or pathology. The coronary CTA interpretation by the cardiologist is attached. COMPARISON:  Coronary calcium study on 07/04/2018 FINDINGS: Vascular: No incidental findings. Mediastinum/Nodes: Visualized mediastinum and hilar regions demonstrate no lymphadenopathy or focal masses. Lungs/Pleura: There is no evidence of pulmonary edema, consolidation, pneumothorax, nodule or pleural fluid. Upper Abdomen: No acute abnormality. Musculoskeletal: No chest wall mass or suspicious bone lesions identified. IMPRESSION: No incidental findings. Electronically Signed: By: Irish Lack M.D. On: 10/05/2019 11:38    Assessment & Plan:   Problem List Items Addressed This Visit     Essential hypertension    Cont on Micardis, Norvasc BP ok at home      Shoulder pain - Primary    New L shoulder pain Blue-Emu cream was recommended to use 2-3 times a day Meloxicam Steroid inj offered       Situational depression    M-in-law died in 09/23/21, f-in-law is 3       Stress at home    M-in-law died in 09-23-2021, f-in-law is 56  discussed      Well adult exam   Relevant Orders   TSH   Urinalysis   CBC with Differential/Platelet   Lipid panel   PSA   Comprehensive metabolic panel      Meds ordered this encounter  Medications   meloxicam (MOBIC) 15 MG tablet    Sig: Take 1 tablet (15 mg total) by mouth daily.    Dispense:  30 tablet    Refill:  1      Follow-up: Return in about 6 months  (around 08/22/2022) for Wellness Exam.  Sonda Primes, MD

## 2022-03-16 ENCOUNTER — Other Ambulatory Visit: Payer: Self-pay | Admitting: Internal Medicine

## 2022-04-06 DIAGNOSIS — H18413 Arcus senilis, bilateral: Secondary | ICD-10-CM | POA: Diagnosis not present

## 2022-04-06 DIAGNOSIS — H35033 Hypertensive retinopathy, bilateral: Secondary | ICD-10-CM | POA: Diagnosis not present

## 2022-05-29 ENCOUNTER — Encounter: Payer: Self-pay | Admitting: Family Medicine

## 2022-05-29 ENCOUNTER — Telehealth (INDEPENDENT_AMBULATORY_CARE_PROVIDER_SITE_OTHER): Payer: Medicare HMO | Admitting: Family Medicine

## 2022-05-29 DIAGNOSIS — R0981 Nasal congestion: Secondary | ICD-10-CM | POA: Diagnosis not present

## 2022-05-29 DIAGNOSIS — U071 COVID-19: Secondary | ICD-10-CM | POA: Diagnosis not present

## 2022-05-29 DIAGNOSIS — R051 Acute cough: Secondary | ICD-10-CM

## 2022-05-29 DIAGNOSIS — J3489 Other specified disorders of nose and nasal sinuses: Secondary | ICD-10-CM

## 2022-05-29 MED ORDER — NIRMATRELVIR/RITONAVIR (PAXLOVID)TABLET: 3.0000 | ORAL_TABLET | Freq: Two times a day (BID) | ORAL | 0 refills | Status: AC

## 2022-05-29 NOTE — Progress Notes (Signed)
MyChart Video Visit    Virtual Visit via Video Note   This visit type was conducted due to national recommendations for restrictions regarding the COVID-19 Pandemic (e.g. social distancing) in an effort to limit this patient's exposure and mitigate transmission in our community. This patient is at least at moderate risk for complications without adequate follow up. This format is felt to be most appropriate for this patient at this time. Physical exam was limited by quality of the video and audio technology used for the visit. CMA was able to get the patient set up on a video visit.  Patient location: Home. Patient and provider in visit Provider location: Office  I discussed the limitations of evaluation and management by telemedicine and the availability of in person appointments. The patient expressed understanding and agreed to proceed.  Visit Date: 05/29/2022  Today's healthcare provider: Hetty Blend, NP-C     Subjective:    Patient ID: Jared Roth, male    DOB: September 10, 1950, 71 y.o.   MRN: [id number]  Chief Complaint  Patient presents with   Covid Positive    05/29/22 Sx include: sweating and fatigue    HPI Tested positive for Covid today.   Symptoms started 2 days ago. Fatigue, body aches, low grade fever, sneezing,  rhinorrhea, nasal congestion, decreased appetite and cough.   Denies chills, dizziness, chest pain, palpitations, shortness of breath, abdominal pain, N/V/D.   Vaccines and boosters.  Hx of Covid     Past Medical History:  Diagnosis Date   Benign prostatic hypertrophy    HSV infection    Hypercholesteremia    Hypertension     Past Surgical History:  Procedure Laterality Date   CYST REMOVAL TRUNK     ELBOW SURGERY      Family History  Problem Relation Age of Onset   Heart disease Father    Hypertension Mother    Stroke Mother    Cancer Brother        lymphoma or leukemia   Heart disease Sister 28       sudden death    Social  History   Socioeconomic History   Marital status: Married    Spouse name: Jared Roth   Number of children: 2   Years of education: Not on file   Highest education level: Not on file  Occupational History   Occupation: retired from [employer]   Occupation: Equities trader in [place]   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    Comment: Jared Roth  Tobacco Use   Smoking status: Never   Smokeless tobacco: Never  Substance and Sexual Activity   Alcohol use: Yes    Comment: social use   Drug use: No   Sexual activity: Yes  Other Topics Concern   Not on file  Social History Narrative   Pt has a total of 9 siblings---he is the youngest of 10   Social Determinants of Health   Financial Resource Strain: Low Risk  (02/19/2022)   Overall Financial Resource Strain (CARDIA)    Difficulty of Paying Living Expenses: Not hard at all  Food Insecurity: No Food Insecurity (02/19/2022)   Hunger Vital Sign    Worried About Running Out of Food in the Last Year: Never true    Ran Out of Food in the Last Year: Never true  Transportation Needs: No Transportation Needs (02/19/2022)   PRAPARE - Administrator, Civil Service (Medical): No    Lack of Transportation (Non-Medical):  No  Physical Activity: Sufficiently Active (02/19/2022)   Exercise Vital Sign    Days of Exercise per Week: 5 days    Minutes of Exercise per Session: 60 min  Stress: No Stress Concern Present (02/19/2022)   Jared Roth [employer] Occupational [employer] - Occupational Stress Questionnaire    Feeling of Stress : Not at all  Social Connections: Moderately Integrated (02/19/2022)   Social Connection and Isolation Panel [NHANES]    Frequency of Communication with Friends and Family: Three times a week    Frequency of Social Gatherings with Friends and Family: Three times a week    Attends Religious Services: 1 to 4 times per year    Active Member of Clubs or Organizations: No    Attends Banker Meetings: Never     Marital Status: Married  Catering manager Violence: Not At Risk (02/19/2022)   Humiliation, Afraid, Rape, and Kick questionnaire    Fear of Current or Ex-Partner: No    Emotionally Abused: No    Physically Abused: No    Sexually Abused: No    Outpatient Medications Prior to Visit  Medication Sig Dispense Refill   amLODipine (NORVASC) 5 MG tablet Take 1 tablet (5 mg total) by mouth daily. 90 tablet 1   b complex vitamins tablet Take 1 tablet by mouth daily. 100 tablet 3   buPROPion (WELLBUTRIN XL) 150 MG 24 hr tablet TAKE 1 TABLET (150 MG TOTAL) BY MOUTH DAILY. TAKE IN AM 90 tablet 2   Cholecalciferol (VITAMIN D3) 2000 units capsule Take 1 capsule (2,000 Units total) by mouth daily. 100 capsule 3   meloxicam (MOBIC) 15 MG tablet Take 1 tablet (15 mg total) by mouth daily. 30 tablet 1   rosuvastatin (CRESTOR) 40 MG tablet TAKE 1 TABLET DAILY. ANNUAL APPT DUE IN DECEMBER MUST SEE PROVIDER FOR FUTURE REFILLS 90 tablet 1   telmisartan (MICARDIS) 80 MG tablet TAKE 1 TABLET EVERY DAY (ANNUAL APPT DUE IN DEC MUST SEE PROVIDER FOR FURTHER REFILLS) 90 tablet 2   No facility-administered medications prior to visit.    Allergies  Allergen Reactions   Atorvastatin     REACTION: INTOL Lipitor w/ rash   Sulfonamide Derivatives     REACTION: as a child    ROS     Objective:    Physical Exam  There were no vitals taken for this visit. Wt Readings from Last 3 Encounters:  02/20/22 168 lb (76.2 kg)  07/31/21 170 lb 9.6 oz (77.4 kg)  03/06/21 171 lb (77.6 kg)   Alert and oriented and in no acute distress. Respirations unlabored. Normal speech and mood.     Assessment & Plan:   Problem List Items Addressed This Visit   None Visit Diagnoses     COVID-19 virus infection    -  Primary   Relevant Medications   nirmatrelvir/ritonavir EUA (PAXLOVID) 20 x 150 MG & 10 x 100MG  TABS   Nasal congestion with rhinorrhea       Acute cough          No acute distress. Paxlovid prescribed.  Counseling on how to take the medication and potential side effects. Symptomatic management discussed.  Counseling on guidelines for isolation and quarantine. Follow up as needed.    I am having Jared Roth start on nirmatrelvir/ritonavir EUA. I am also having him maintain his b complex vitamins, Vitamin D3, buPROPion, telmisartan, meloxicam, rosuvastatin, and amLODipine.  Meds ordered this encounter  Medications   nirmatrelvir/ritonavir EUA (PAXLOVID) 20  x 150 MG & 10 x 100MG  TABS    Sig: Take 3 tablets by mouth 2 (two) times daily for 5 days. (Take nirmatrelvir 150 mg two tablets twice daily for 5 days and ritonavir 100 mg one tablet twice daily for 5 days) Patient GFR is 81.    Dispense:  30 tablet    Refill:  0    Order Specific Question:   Supervising Provider    Answer:   Pricilla Holm A [[id number]]    I discussed the assessment and treatment plan with the patient. The patient was provided an opportunity to ask questions and all were answered. The patient agreed with the plan and demonstrated an understanding of the instructions.   The patient was advised to call back or seek an in-person evaluation if the symptoms worsen or if the condition fails to improve as anticipated.  I provided 18 minutes of face-to-face time during this encounter.   Harland Dingwall, NP-C [facility] [place] [phone] (phone) [phone] (fax)  [place]

## 2022-08-06 ENCOUNTER — Ambulatory Visit: Payer: Medicare HMO | Admitting: Cardiovascular Disease

## 2022-08-08 ENCOUNTER — Encounter: Payer: Self-pay | Admitting: Internal Medicine

## 2022-08-08 ENCOUNTER — Ambulatory Visit (INDEPENDENT_AMBULATORY_CARE_PROVIDER_SITE_OTHER): Payer: Medicare HMO | Admitting: Internal Medicine

## 2022-08-08 VITALS — BP 138/78 | HR 55 | Temp 97.7°F | Ht 66.0 in | Wt 174.4 lb

## 2022-08-08 DIAGNOSIS — E785 Hyperlipidemia, unspecified: Secondary | ICD-10-CM

## 2022-08-08 DIAGNOSIS — E7800 Pure hypercholesterolemia, unspecified: Secondary | ICD-10-CM | POA: Diagnosis not present

## 2022-08-08 DIAGNOSIS — Z0000 Encounter for general adult medical examination without abnormal findings: Secondary | ICD-10-CM

## 2022-08-08 DIAGNOSIS — Z125 Encounter for screening for malignant neoplasm of prostate: Secondary | ICD-10-CM | POA: Diagnosis not present

## 2022-08-08 DIAGNOSIS — I2510 Atherosclerotic heart disease of native coronary artery without angina pectoris: Secondary | ICD-10-CM

## 2022-08-08 DIAGNOSIS — F439 Reaction to severe stress, unspecified: Secondary | ICD-10-CM

## 2022-08-08 LAB — CBC WITH DIFFERENTIAL/PLATELET
Basophils Absolute: 0 10*3/uL (ref 0.0–0.1)
Basophils Relative: 0.6 % (ref 0.0–3.0)
Eosinophils Absolute: 0.1 10*3/uL (ref 0.0–0.7)
Eosinophils Relative: 1.5 % (ref 0.0–5.0)
HCT: 40.6 % (ref 39.0–52.0)
Hemoglobin: 13.6 g/dL (ref 13.0–17.0)
Lymphocytes Relative: 37.5 % (ref 12.0–46.0)
Lymphs Abs: 1.6 10*3/uL (ref 0.7–4.0)
MCHC: 33.6 g/dL (ref 30.0–36.0)
MCV: 93.8 fl (ref 78.0–100.0)
Monocytes Absolute: 0.6 10*3/uL (ref 0.1–1.0)
Monocytes Relative: 13.1 % — ABNORMAL HIGH (ref 3.0–12.0)
Neutro Abs: 2 10*3/uL (ref 1.4–7.7)
Neutrophils Relative %: 47.3 % (ref 43.0–77.0)
Platelets: 234 10*3/uL (ref 150.0–400.0)
RBC: 4.33 Mil/uL (ref 4.22–5.81)
RDW: 13.6 % (ref 11.5–15.5)
WBC: 4.3 10*3/uL (ref 4.0–10.5)

## 2022-08-08 LAB — COMPREHENSIVE METABOLIC PANEL
ALT: 23 U/L (ref 0–53)
AST: 25 U/L (ref 0–37)
Albumin: 4.6 g/dL (ref 3.5–5.2)
Alkaline Phosphatase: 67 U/L (ref 39–117)
BUN: 10 mg/dL (ref 6–23)
CO2: 31 mEq/L (ref 19–32)
Calcium: 9.5 mg/dL (ref 8.4–10.5)
Chloride: 103 mEq/L (ref 96–112)
Creatinine, Ser: 0.92 mg/dL (ref 0.40–1.50)
GFR: 85.08 mL/min (ref >60.00)
Glucose, Bld: 98 mg/dL (ref 70–99)
Potassium: 4.2 mEq/L (ref 3.5–5.1)
Sodium: 140 mEq/L (ref 135–145)
Total Bilirubin: 0.6 mg/dL (ref 0.2–1.2)
Total Protein: 7.9 g/dL (ref 6.0–8.3)

## 2022-08-08 LAB — URINALYSIS, ROUTINE W REFLEX MICROSCOPIC
Bilirubin Urine: NEGATIVE
Hgb urine dipstick: NEGATIVE
Ketones, ur: NEGATIVE
Nitrite: NEGATIVE
RBC / HPF: NONE SEEN (ref 0–?)
Specific Gravity, Urine: <=1.005 — AB (ref 1.000–1.030)
Total Protein, Urine: NEGATIVE
Urine Glucose: NEGATIVE
Urobilinogen, UA: 0.2 (ref 0.0–1.0)
pH: 7 (ref 5.0–8.0)

## 2022-08-08 LAB — LIPID PANEL
Cholesterol: 133 mg/dL (ref 0–200)
HDL: 56.1 mg/dL (ref >39.00)
LDL Cholesterol: 65 mg/dL (ref 0–99)
NonHDL: 77.12
Total CHOL/HDL Ratio: 2
Triglycerides: 62 mg/dL (ref 0.0–149.0)
VLDL: 12.4 mg/dL (ref 0.0–40.0)

## 2022-08-08 LAB — TSH: TSH: 0.72 u[IU]/mL (ref 0.35–5.50)

## 2022-08-08 LAB — PSA: PSA: 1.9 ng/mL (ref 0.10–4.00)

## 2022-08-08 NOTE — Assessment & Plan Note (Signed)
CT Ca score 11/19 - Coronary arteries: Mild calcium noted in all 3 major epicardial coronary arteries. Coronary calcium score of 81.  On Crestor

## 2022-08-08 NOTE — Assessment & Plan Note (Signed)
CT Ca score 11/19 - Coronary arteries: Mild calcium noted in all 3 major epicardial coronary arteries. Coronary calcium score of 81.  On Crestor 

## 2022-08-08 NOTE — Assessment & Plan Note (Signed)
We discussed age appropriate health related issues, including available/recomended screening tests and vaccinations. Labs were ordered to be later reviewed . All questions were answered. We discussed one or more of the following - seat belt use, use of sunscreen/sun exposure exercise, safe sex, fall risk reduction, second hand smoke exposure, firearm use and storage, seat belt use, a need for adhering to healthy diet and exercise. Labs were ordered.  All questions were answered. Colon due in 2027 (last 2017) Dr Gwenevere Abbot

## 2022-08-08 NOTE — Progress Notes (Signed)
Subjective:  Patient ID: Jared Roth, male    DOB: May 12, 1951  Age: 71 y.o. MRN: [id number]  CC: Follow-up and Annual Exam   HPI Jared Roth presents for a well exam  Outpatient Medications Prior to Visit  Medication Sig Dispense Refill   amLODipine (NORVASC) 5 MG tablet Take 1 tablet (5 mg total) by mouth daily. 90 tablet 1   b complex vitamins tablet Take 1 tablet by mouth daily. 100 tablet 3   Cholecalciferol (VITAMIN D3) 2000 units capsule Take 1 capsule (2,000 Units total) by mouth daily. 100 capsule 3   rosuvastatin (CRESTOR) 40 MG tablet TAKE 1 TABLET DAILY. ANNUAL APPT DUE IN DECEMBER MUST SEE PROVIDER FOR FUTURE REFILLS 90 tablet 1   telmisartan (MICARDIS) 80 MG tablet TAKE 1 TABLET EVERY DAY (ANNUAL APPT DUE IN DEC MUST SEE PROVIDER FOR FURTHER REFILLS) 90 tablet 2   buPROPion (WELLBUTRIN XL) 150 MG 24 hr tablet TAKE 1 TABLET (150 MG TOTAL) BY MOUTH DAILY. TAKE IN AM 90 tablet 2   meloxicam (MOBIC) 15 MG tablet Take 1 tablet (15 mg total) by mouth daily. (Patient not taking: Reported on 08/08/2022) 30 tablet 1   No facility-administered medications prior to visit.    ROS: Review of Systems  Constitutional:  Negative for appetite change, fatigue and unexpected weight change.  HENT:  Negative for congestion, nosebleeds, sneezing, sore throat and trouble swallowing.   Eyes:  Negative for itching and visual disturbance.  Respiratory:  Negative for cough.   Cardiovascular:  Negative for chest pain, palpitations and leg swelling.  Gastrointestinal:  Negative for abdominal distention, blood in stool, diarrhea and nausea.  Genitourinary:  Negative for frequency and hematuria.  Musculoskeletal:  Negative for back pain, gait problem, joint swelling and neck pain.  Skin:  Negative for rash.  Neurological:  Negative for dizziness, tremors, speech difficulty and weakness.  Psychiatric/Behavioral:  Negative for agitation, dysphoric mood and sleep disturbance. The patient is not  nervous/anxious.     Objective:  BP 138/78 (BP Location: Left Arm)   Pulse (!) 55   Temp 97.7 F (36.5 C) (Oral)   Ht 5\' 6"  (1.676 m)   Wt 174 lb 6.4 oz (79.1 kg)   SpO2 96%   BMI 28.15 kg/m   BP Readings from Last 3 Encounters:  08/08/22 138/78  02/20/22 122/80  07/31/21 132/80    Wt Readings from Last 3 Encounters:  08/08/22 174 lb 6.4 oz (79.1 kg)  02/20/22 168 lb (76.2 kg)  07/31/21 170 lb 9.6 oz (77.4 kg)    Physical Exam Constitutional:      General: He is not in acute distress.    Appearance: He is well-developed.     Comments: NAD  Eyes:     Conjunctiva/sclera: Conjunctivae normal.     Pupils: Pupils are equal, round, and reactive to light.  Neck:     Thyroid: No thyromegaly.     Vascular: No JVD.  Cardiovascular:     Rate and Rhythm: Normal rate and regular rhythm.     Roth sounds: Normal Roth sounds. No murmur heard.    No friction rub. No gallop.  Pulmonary:     Effort: Pulmonary effort is normal. No respiratory distress.     Breath sounds: Normal breath sounds. No wheezing or rales.  Chest:     Chest wall: No tenderness.  Abdominal:     General: Bowel sounds are normal. There is no distension.     Palpations: Abdomen is  soft. There is no mass.     Tenderness: There is no abdominal tenderness. There is no guarding or rebound.  Genitourinary:    Rectum: Normal. Guaiac result negative.  Musculoskeletal:        General: No tenderness. Normal range of motion.     Cervical back: Normal range of motion.  Lymphadenopathy:     Cervical: No cervical adenopathy.  Skin:    General: Skin is warm and dry.     Findings: No rash.  Neurological:     Mental Status: He is alert and oriented to person, place, and time.     Cranial Nerves: No cranial nerve deficit.     Motor: No abnormal muscle tone.     Coordination: Coordination normal.     Gait: Gait normal.     Deep Tendon Reflexes: Reflexes are normal and symmetric.  Psychiatric:        Behavior:  Behavior normal.        Thought Content: Thought content normal.        Judgment: Judgment normal.   Prostate 1+  Lab Results  Component Value Date   WBC 3.6 (L) 07/31/2021   HGB 13.0 07/31/2021   HCT 39.5 07/31/2021   PLT 251.0 07/31/2021   GLUCOSE 90 07/31/2021   CHOL 116 07/31/2021   TRIG 59.0 07/31/2021   HDL 54.90 07/31/2021   LDLDIRECT 172.5 09/02/2006   LDLCALC 49 07/31/2021   ALT 36 07/31/2021   AST 28 07/31/2021   NA 140 07/31/2021   K 4.3 07/31/2021   CL 102 07/31/2021   CREATININE 0.96 07/31/2021   BUN 9 07/31/2021   CO2 30 07/31/2021   TSH 1.36 07/31/2021   PSA 1.49 07/31/2021    CT CORONARY MORPH W/CTA COR W/SCORE W/CA W/CM &/OR WO/CM  Addendum Date: 10/05/2019   ADDENDUM REPORT: 10/05/2019 18:59 CLINICAL DATA:  Chest pain/syncope EXAM: Cardiac/Coronary CTA TECHNIQUE: The patient was scanned on a [employer]. A 100 kV prospective scan was triggered in the descending thoracic aorta at 111 HU's. Axial non-contrast 3 mm slices were carried out through the Roth. The data set was analyzed on a dedicated work station and scored using the Agatson method. Gantry rotation speed was 250 msecs and collimation was .6 mm. No beta blockade and 0.8 mg of sl NTG was given. The 3D data set was reconstructed in 5% intervals of the 35-75 % of the R-R cycle. Diastolic phases were analyzed on a dedicated work station using MPR, MIP and VRT modes. The patient received 80 cc of contrast. FINDINGS: Image quality: excellent. Noise artifact is: Limited. Coronary Arteries:  Normal coronary origin.  Right dominance. Left main: The left main is a large caliber vessel with a normal take off from the left coronary cusp that trifurcates into a LAD, LCX, and ramus intermedius. There is no plaque or stenosis. Left anterior descending artery: The proximal LAD is patent without plaque or stenosis. The minimal LAD contains mild non-calcified plaque (<25%). The distal LAD contains mild calcified  plaque (25-49%). The first diagonal contains mild calcified plaque (25-49%). Ramus intermedius: The ramus intermedius contains mild mixed density plaque (25-49%). Left circumflex artery: The LCX is non-dominant. The proximal LCX contains minimal calcified plaque (<25%). There is mild non-calcified plaque (25-49%) in the distal LCX. The LCX gives off 2 patent obtuse marginal branches. Right coronary artery: The RCA is dominant with normal take off from the right coronary cusp. The proximal RCA is patent. The mid RCA contains a  mild non-calcified plaque (25-49%). There is artifact in this region due to an acute marginal branch, but this lesion appears mild. The distal RCA is patent. The RCA terminates as a PDA and right posterolateral branch without evidence of plaque or stenosis. Right Atrium: Right atrial size is within normal limits. Right Ventricle: The right ventricular cavity is within normal limits. Left Atrium: Left atrial size is normal in size with no left atrial appendage filling defect. A small PFO is noted. Left Ventricle: The ventricular cavity size is within normal limits. There are no stigmata of prior infarction. There is no abnormal filling defect. Pulmonary arteries: Normal in size without proximal filling defect. Pulmonary veins: Normal pulmonary venous drainage. Pericardium: Normal thickness with no significant effusion or calcium present. Cardiac valves: The aortic valve is trileaflet without significant calcification. The mitral valve is normal structure without significant calcification. Aorta: Normal caliber with no significant disease. Extra-cardiac findings: See attached radiology report for non-cardiac structures. IMPRESSION: 1. Coronary calcium score of 172. This was 80th percentile for age and sex matched control. 2. Normal coronary origin with right dominance. 3. 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 4. Small PFO noted. RECOMMENDATIONS: 1. Mild non-obstructive CAD  (25-49%). Consider non-atherosclerotic causes of chest pain. Consider preventive therapy and risk factor modification. Lennie Odor, MD Electronically Signed   By: Lennie Odor   On: 10/05/2019 18:59   Result Date: 10/05/2019 EXAM: OVER-READ INTERPRETATION  CT CHEST The following report is an over-read performed by radiologist Dr. Irish Lack of [facility] on 10/05/2019. This over-read does not include interpretation of cardiac or coronary anatomy or pathology. The coronary CTA interpretation by the cardiologist is attached. COMPARISON:  Coronary calcium study on 07/04/2018 FINDINGS: Vascular: No incidental findings. Mediastinum/Nodes: Visualized mediastinum and hilar regions demonstrate no lymphadenopathy or focal masses. Lungs/Pleura: There is no evidence of pulmonary edema, consolidation, pneumothorax, nodule or pleural fluid. Upper Abdomen: No acute abnormality. Musculoskeletal: No chest wall mass or suspicious bone lesions identified. IMPRESSION: No incidental findings. Electronically Signed: By: Irish Lack M.D. On: 10/05/2019 11:38    Assessment & Plan:   Problem List Items Addressed This Visit     Well adult exam - Primary    We discussed age appropriate health related issues, including available/recomended screening tests and vaccinations. Labs were ordered to be later reviewed . All questions were answered. We discussed one or more of the following - seat belt use, use of sunscreen/sun exposure exercise, safe sex, fall risk reduction, second hand smoke exposure, firearm use and storage, seat belt use, a need for adhering to healthy diet and exercise. Labs were ordered.  All questions were answered. Colon due in 2027 (last 2017) Dr Gwenevere Abbot      Stress at home     F-in-law is 62 lives w/them      Dyslipidemia    CT Ca score 11/19 - Coronary arteries: Mild calcium noted in all 3 major epicardial coronary arteries. Coronary calcium score of 81.  On Crestor       Coronary atherosclerosis    CT Ca score 11/19 - Coronary arteries: Mild calcium noted in all 3 major epicardial coronary arteries. Coronary calcium score of 81.  On Crestor      Other Visit Diagnoses     HYPERCHOLESTEROLEMIA             No orders of the defined types were placed in this encounter.     Follow-up: Return for a follow-up visit.  Alex Vaness Jelinski,  MD

## 2022-08-08 NOTE — Assessment & Plan Note (Signed)
F-in-law is 97 lives w/them

## 2022-08-30 NOTE — Progress Notes (Signed)
Cardiology Clinic Note   Patient Name: Jared Roth Date of Encounter: 08/31/2022  Primary Care Provider:  Tresa Garter, MD Primary Cardiologist:  Reatha Harps, MD  Patient Profile    72 year old male  hx of HTN, non-obstructive CAD with CTA 10/05/2019 Agatston score < 100, mixed HL, near syncope which was suspected as vasovagal in the setting of dehydration. No cardiac ischemia work up was planned. Last seen by Dr. Flora Lipps on 01/18/2021.  Past Medical History    Past Medical History:  Diagnosis Date   Benign prostatic hypertrophy    HSV infection    Hypercholesteremia    Hypertension    Past Surgical History:  Procedure Laterality Date   CYST REMOVAL TRUNK     ELBOW SURGERY      Allergies  Allergies  Allergen Reactions   Atorvastatin     REACTION: INTOL Lipitor w/ rash   Sulfonamide Derivatives     REACTION: as a child    History of Present Illness    [name] is a very pleasant 72 year old male who presents for follow-up with history of hypertension, and remote history of near syncope.  Since being seen last he has been doing very well, remains active, medically compliant, no new diagnoses or allergies are reported.  He continues to follow with his primary care provider with labs and annual immunizations.  He is currently without any complaints.  Home Medications    Current Outpatient Medications  Medication Sig Dispense Refill   amLODipine (NORVASC) 5 MG tablet Take 1 tablet (5 mg total) by mouth daily. 90 tablet 1   b complex vitamins tablet Take 1 tablet by mouth daily. 100 tablet 3   Cholecalciferol (VITAMIN D3) 2000 units capsule Take 1 capsule (2,000 Units total) by mouth daily. 100 capsule 3   rosuvastatin (CRESTOR) 40 MG tablet TAKE 1 TABLET DAILY. ANNUAL APPT DUE IN DECEMBER MUST SEE PROVIDER FOR FUTURE REFILLS 90 tablet 1   telmisartan (MICARDIS) 80 MG tablet TAKE 1 TABLET EVERY DAY (ANNUAL APPT DUE IN DEC MUST SEE PROVIDER FOR FURTHER REFILLS) 90  tablet 2   No current facility-administered medications for this visit.     Family History    Family History  Problem Relation Age of Onset   Heart disease Father    Hypertension Mother    Stroke Mother    Cancer Brother        lymphoma or leukemia   Heart disease Sister 41       sudden death   He indicated that his mother is deceased. He indicated that his father is deceased. He indicated that his sister is deceased. He indicated that his brother is deceased.  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    Comment: [employer]  Tobacco Use   Smoking status: Never   Smokeless tobacco: Never  Substance and Sexual Activity   Alcohol use: Yes    Comment: social use   Drug use: No   Sexual activity: Yes  Other Topics Concern   Not on file  Social History Narrative   Pt has a total of 9  siblings---he is the youngest of 17   Social Determinants of Health   Financial Resource Strain: Low Risk  (02/19/2022)   Overall Financial Resource Strain (CARDIA)    Difficulty of Paying Living Expenses: Not hard at all  Food Insecurity: No Food Insecurity (02/19/2022)   Hunger Vital Sign    Worried About Running Out of Food in the Last Year: Never true    Ran Out of Food in the Last Year: Never true  Transportation Needs: No Transportation Needs (02/19/2022)   PRAPARE - Hydrologist (Medical): No    Lack of Transportation (Non-Medical): No  Physical Activity: Sufficiently Active (02/19/2022)   Exercise Vital Sign    Days of Exercise per Week: 5 days    Minutes of Exercise per Session: 60 min  Stress: No Stress Concern Present (02/19/2022)   [place]    Feeling of Stress : Not at all  Social Connections: Moderately Integrated (02/19/2022)   Social Connection and Isolation Panel [NHANES]    Frequency of Communication with Friends and Family: Three times a week    Frequency of Social Gatherings with Friends and Family: Three times a week    Attends Religious Services: 1 to 4 times per year    Active Member of Clubs or Organizations: No    Attends Archivist Meetings: Never    Marital Status: Married  Human resources officer Violence: Not At Risk (02/19/2022)   Humiliation, Afraid, Rape, and Kick questionnaire    Fear of Current or Ex-Partner: No    Emotionally Abused: No    Physically Abused: No    Sexually Abused: No     Review of Systems    General:  No chills, fever, night sweats or weight changes.  Cardiovascular:  No chest pain, dyspnea on exertion, edema, orthopnea, palpitations, paroxysmal nocturnal dyspnea. Dermatological: No rash, lesions/masses Respiratory: No cough, dyspnea Urologic: No hematuria, dysuria Abdominal:   No nausea, vomiting, diarrhea, bright red blood per rectum, melena, or hematemesis Neurologic:  No visual changes, wkns, changes in mental status. All other systems reviewed and are otherwise negative except as noted above.     Physical Exam    VS:  BP (!) 140/78   Pulse (!) 58   Ht 5\' 6"  (1.676 m)   Wt 174 lb 3.2 oz (79 kg)   SpO2 97%   BMI 28.12 kg/m  , BMI Body mass index is 28.12 kg/m.     GEN: Well nourished, well developed, in no acute distress. HEENT: normal. Neck: Supple, no JVD, carotid bruits, or masses. Cardiac: RRR, no murmurs, rubs, or gallops. No clubbing, cyanosis, edema.  Radials/DP/PT 2+ and equal bilaterally.  Respiratory:  Respirations regular and unlabored, clear to auscultation bilaterally. GI: Soft, nontender, nondistended, BS + x 4. MS: no deformity or atrophy. Skin: warm and dry, no rash. Neuro:   Strength and sensation are intact. Psych: Normal affect.  Accessory Clinical Findings    ECG personally reviewed by me today-sinus bradycardia heart rate 58 bpm- No acute changes  Lab Results  Component Value Date   WBC 4.3 08/08/2022   HGB 13.6 08/08/2022   HCT 40.6 08/08/2022   MCV 93.8 08/08/2022   PLT 234.0 08/08/2022   Lab Results  Component Value Date   CREATININE 0.92 08/08/2022   BUN 10 08/08/2022   NA 140 08/08/2022   K 4.2 08/08/2022   CL 103 08/08/2022   CO2 31 08/08/2022  Lab Results  Component Value Date   ALT 23 08/08/2022   AST 25 08/08/2022   ALKPHOS 67 08/08/2022   BILITOT 0.6 08/08/2022   Lab Results  Component Value Date   CHOL 133 08/08/2022   HDL 56.10 08/08/2022   LDLCALC 65 08/08/2022   LDLDIRECT 172.5 09/02/2006   TRIG 62.0 08/08/2022   CHOLHDL 2 08/08/2022    No results found for: "HGBA1C"  Review of Prior Studies:   Assessment & Plan   1.  Hypertension: Slightly elevated today.  He is under some acute stress as his father-in-law died last night.  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  Review of previous blood pressure readings revealed normal blood pressure results.  No changes in his medication, continue amlodipine 5 mg daily and Micardis 80 mg daily as directed.  Labs and medications are managed and provided by his primary care provider Dr. Alain Marion.  2.  Nonobstructive CAD: Remains asymptomatic.  Active.  Occasionally goes to the gym but is always on his feet moving throughout the day.  Continue cardiovascular risk factor modification with heart healthy diet, exercise, and management of weight and stress.  3.  Hyperlipidemia: Remains on rosuvastatin 40 mg daily.  Review of recent labs completed on 08/08/2022, LDL 65, HDL 56, total cholesterol 133.  Continue current regimen.  Current medicines are reviewed at length with the patient today.  I have spent 25 min's  dedicated to the care of this patient on the date of this  encounter to include pre-visit review of records, assessment, management and diagnostic testing,with shared decision making.  Signed, Phill Myron. West Pugh, ANP, [place]   08/31/2022 12:47 PM      Office [phone] Fax [phone]  Notice: This dictation was prepared with Dragon dictation along with smaller phrase technology. Any transcriptional errors that result from this process are unintentional and may not be corrected upon review.

## 2022-08-31 ENCOUNTER — Encounter: Payer: Self-pay | Admitting: Adult Health

## 2022-08-31 ENCOUNTER — Ambulatory Visit: Payer: Medicare HMO | Attending: Cardiovascular Disease | Admitting: Adult Health

## 2022-08-31 VITALS — BP 140/78 | HR 58 | Ht 66.0 in | Wt 174.2 lb

## 2022-08-31 DIAGNOSIS — I10 Essential (primary) hypertension: Secondary | ICD-10-CM

## 2022-08-31 NOTE — Patient Instructions (Signed)
Medication Instructions:  No Changes *If you need a refill on your cardiac medications before your next appointment, please call your pharmacy*   Lab Work: No Labs If you have labs (blood work) drawn today and your tests are completely normal, you will receive your results only by: MyChart Message (if you have MyChart) OR A paper copy in the mail If you have any lab test that is abnormal or we need to change your treatment, we will call you to review the results.   Testing/Procedures: No Testing   Follow-Up: At [facility], you and your health needs are our priority.  As part of our continuing mission to provide you with exceptional heart care, we have created designated Provider Care Teams.  These Care Teams include your primary Cardiologist (physician) and Advanced Practice Providers (APPs -  Physician Assistants and Nurse Practitioners) who all work together to provide you with the care you need, when you need it.  We recommend signing up for the patient portal called "MyChart".  Sign up information is provided on this After Visit Summary.  MyChart is used to connect with patients for Virtual Visits (Telemedicine).  Patients are able to view lab/test results, encounter notes, upcoming appointments, etc.  Non-urgent messages can be sent to your provider as well.   To learn more about what you can do with MyChart, go to [email or web address].    Your next appointment:   1 year(s)  The format for your next appointment:   In Person  Provider:   Bassett T. O'Neal, MD   

## 2022-10-25 ENCOUNTER — Other Ambulatory Visit: Payer: Self-pay | Admitting: Internal Medicine

## 2022-11-14 ENCOUNTER — Other Ambulatory Visit: Payer: Self-pay | Admitting: Internal Medicine

## 2023-02-21 ENCOUNTER — Ambulatory Visit (INDEPENDENT_AMBULATORY_CARE_PROVIDER_SITE_OTHER): Payer: Medicare HMO

## 2023-02-21 VITALS — BP 160/80 | HR 65 | Temp 98.1°F | Ht 66.0 in | Wt 173.0 lb

## 2023-02-21 DIAGNOSIS — Z0000 Encounter for general adult medical examination without abnormal findings: Secondary | ICD-10-CM | POA: Diagnosis not present

## 2023-02-21 NOTE — Patient Instructions (Signed)
[name] , Thank you for taking time to come for your Medicare Wellness Visit. I appreciate your ongoing commitment to your health goals. Please review the following plan we discussed and let me know if I can assist you in the future.   These are the goals we discussed:  Goals      My healthcare goal for 2024 is to maintain my current health status by continuing to eat healthy, stay independent, physically and socially active.        This is a list of the screening recommended for you and due dates:  Health Maintenance  Topic Date Due   Hepatitis C Screening  Never done   COVID-19 Vaccine (5 - 2023-24 season) 04/27/2022   Medicare Annual Wellness Visit  02/20/2023   Flu Shot  03/28/2023   DTaP/Tdap/Td vaccine (2 - Td or Tdap) 12/08/2023   Colon Cancer Screening  01/26/2026   Pneumonia Vaccine  Completed   Zoster (Shingles) Vaccine  Completed   HPV Vaccine  Aged Out    Advanced directives: Yes  Conditions/risks identified: Yes  Next appointment: Follow up in one year for your annual wellness visit.  Preventive Care 27 Years and Older, Male  Preventive care refers to lifestyle choices and visits with your health care provider that can promote health and wellness. What does preventive care include? A yearly physical exam. This is also called an annual well check. Dental exams once or twice a year. Routine eye exams. Ask your health care provider how often you should have your eyes checked. Personal lifestyle choices, including: Daily care of your teeth and gums. Regular physical activity. Eating a healthy diet. Avoiding tobacco and drug use. Limiting alcohol use. Practicing safe sex. Taking low doses of aspirin every day. Taking vitamin and mineral supplements as recommended by your health care provider. What happens during an annual well check? The services and screenings done by your health care provider during your annual well check will depend on your age, overall  health, lifestyle risk factors, and family history of disease. Counseling  Your health care provider may ask you questions about your: Alcohol use. Tobacco use. Drug use. Emotional well-being. Home and relationship well-being. Sexual activity. Eating habits. History of falls. Memory and ability to understand (cognition). Work and work Astronomer. Screening  You may have the following tests or measurements: Height, weight, and BMI. Blood pressure. Lipid and cholesterol levels. These may be checked every 5 years, or more frequently if you are over 17 years old. Skin check. Lung cancer screening. You may have this screening every year starting at age 77 if you have a 30-pack-year history of smoking and currently smoke or have quit within the past 15 years. Fecal occult blood test (FOBT) of the stool. You may have this test every year starting at age 65. Flexible sigmoidoscopy or colonoscopy. You may have a sigmoidoscopy every 5 years or a colonoscopy every 10 years starting at age 61. Prostate cancer screening. Recommendations will vary depending on your family history and other risks. Hepatitis C blood test. Hepatitis B blood test. Sexually transmitted disease (STD) testing. Diabetes screening. This is done by checking your blood sugar (glucose) after you have not eaten for a while (fasting). You may have this done every 1-3 years. Abdominal aortic aneurysm (AAA) screening. You may need this if you are a current or former smoker. Osteoporosis. You may be screened starting at age 85 if you are at high risk. Talk with your health care provider  about your test results, treatment options, and if necessary, the need for more tests. Vaccines  Your health care provider may recommend certain vaccines, such as: Influenza vaccine. This is recommended every year. Tetanus, diphtheria, and acellular pertussis (Tdap, Td) vaccine. You may need a Td booster every 10 years. Zoster vaccine. You may  need this after age 52. Pneumococcal 13-valent conjugate (PCV13) vaccine. One dose is recommended after age 29. Pneumococcal polysaccharide (PPSV23) vaccine. One dose is recommended after age 73. Talk to your health care provider about which screenings and vaccines you need and how often you need them. This information is not intended to replace advice given to you by your health care provider. Make sure you discuss any questions you have with your health care provider. Document Released: 09/09/2015 Document Revised: 05/02/2016 Document Reviewed: 06/14/2015 Elsevier Interactive Patient Education  2017 [place] Prevention in the Home Falls can cause injuries. They can happen to people of all ages. There are many things you can do to make your home safe and to help prevent falls. What can I do on the outside of my home? Regularly fix the edges of walkways and driveways and fix any cracks. Remove anything that might make you trip as you walk through a door, such as a raised step or threshold. Trim any bushes or trees on the path to your home. Use bright outdoor lighting. Clear any walking paths of anything that might make someone trip, such as rocks or tools. Regularly check to see if handrails are loose or broken. Make sure that both sides of any steps have handrails. Any raised decks and porches should have guardrails on the edges. Have any leaves, snow, or ice cleared regularly. Use sand or salt on walking paths during winter. Clean up any spills in your garage right away. This includes oil or grease spills. What can I do in the bathroom? Use night lights. Install grab bars by the toilet and in the tub and shower. Do not use towel bars as grab bars. Use non-skid mats or decals in the tub or shower. If you need to sit down in the shower, use a plastic, non-slip stool. Keep the floor dry. Clean up any water that spills on the floor as soon as it happens. Remove soap buildup in  the tub or shower regularly. Attach bath mats securely with double-sided non-slip rug tape. Do not have throw rugs and other things on the floor that can make you trip. What can I do in the bedroom? Use night lights. Make sure that you have a light by your bed that is easy to reach. Do not use any sheets or blankets that are too big for your bed. They should not hang down onto the floor. Have a firm chair that has side arms. You can use this for support while you get dressed. Do not have throw rugs and other things on the floor that can make you trip. What can I do in the kitchen? Clean up any spills right away. Avoid walking on wet floors. Keep items that you use a lot in easy-to-reach places. If you need to reach something above you, use a strong step stool that has a grab bar. Keep electrical cords out of the way. Do not use floor polish or wax that makes floors slippery. If you must use wax, use non-skid floor wax. Do not have throw rugs and other things on the floor that can make you trip. What can I do  with my stairs? Do not leave any items on the stairs. Make sure that there are handrails on both sides of the stairs and use them. Fix handrails that are broken or loose. Make sure that handrails are as long as the stairways. Check any carpeting to make sure that it is firmly attached to the stairs. Fix any carpet that is loose or worn. Avoid having throw rugs at the top or bottom of the stairs. If you do have throw rugs, attach them to the floor with carpet tape. Make sure that you have a light switch at the top of the stairs and the bottom of the stairs. If you do not have them, ask someone to add them for you. What else can I do to help prevent falls? Wear shoes that: Do not have high heels. Have rubber bottoms. Are comfortable and fit you well. Are closed at the toe. Do not wear sandals. If you use a stepladder: Make sure that it is fully opened. Do not climb a closed  stepladder. Make sure that both sides of the stepladder are locked into place. Ask someone to hold it for you, if possible. Clearly mark and make sure that you can see: Any grab bars or handrails. First and last steps. Where the edge of each step is. Use tools that help you move around (mobility aids) if they are needed. These include: Canes. Walkers. Scooters. Crutches. Turn on the lights when you go into a dark area. Replace any light bulbs as soon as they burn out. Set up your furniture so you have a clear path. Avoid moving your furniture around. If any of your floors are uneven, fix them. If there are any pets around you, be aware of where they are. Review your medicines with your doctor. Some medicines can make you feel dizzy. This can increase your chance of falling. Ask your doctor what other things that you can do to help prevent falls. This information is not intended to replace advice given to you by your health care provider. Make sure you discuss any questions you have with your health care provider. Document Released: 06/09/2009 Document Revised: 01/19/2016 Document Reviewed: 09/17/2014 Elsevier Interactive Patient Education  2017 [employer].

## 2023-02-21 NOTE — Progress Notes (Addendum)
Subjective:   Jared Roth is a 72 y.o. male who presents for Medicare Annual/Subsequent preventive examination.  Visit Complete: In person  Patient Medicare AWV questionnaire was completed by the patient on 02/18/2023; I have confirmed that all information answered by patient is correct and no changes since this date.  Review of Systems     Cardiac Risk Factors include: advanced age (>43men, >82 women);dyslipidemia;family history of premature cardiovascular disease;hypertension;male gender     Objective:    Today's Vitals   02/21/23 1018  BP: (!) 160/80  Pulse: 65  Temp: 98.1 F (36.7 C)  TempSrc: Temporal  SpO2: 97%  Weight: 173 lb (78.5 kg)  Height: 5\' 6"  (1.676 m)  PainSc: 0-No pain   Body mass index is 27.92 kg/m.     02/21/2023   10:26 AM 02/19/2022    8:54 AM 02/09/2021    9:38 AM 01/13/2020    8:26 AM  Advanced Directives  Does Patient Have a Medical Advance Directive? Yes No Yes Yes  Type of Estate agent of [place];Living will  Living will Living will  Does patient want to make changes to medical advance directive?   No - Patient declined No - Patient declined  Copy of Healthcare Power of Attorney in Chart? No - copy requested     Would patient like information on creating a medical advance directive?  No - Patient declined      Current Medications (verified) Outpatient Encounter Medications as of 02/21/2023  Medication Sig   amLODipine (NORVASC) 5 MG tablet TAKE 1 TABLET EVERY DAY   b complex vitamins tablet Take 1 tablet by mouth daily.   Cholecalciferol (VITAMIN D3) 2000 units capsule Take 1 capsule (2,000 Units total) by mouth daily.   rosuvastatin (CRESTOR) 40 MG tablet TAKE 1 TABLET DAILY. ANNUAL APPT DUE IN DECEMBER MUST SEE PROVIDER FOR FUTURE REFILLS.   telmisartan (MICARDIS) 80 MG tablet TAKE 1 TABLET EVERY DAY (ANNUAL APPOINTMENT DUE IN [place], MUST SEE PROVIDER FOR FURTHER REFILLS)   No facility-administered encounter  medications on file as of 02/21/2023.    Allergies (verified) Atorvastatin and Sulfonamide derivatives   History: Past Medical History:  Diagnosis Date   Benign prostatic hypertrophy    HSV infection    Hypercholesteremia    Hypertension    Past Surgical History:  Procedure Laterality Date   CYST REMOVAL TRUNK     ELBOW SURGERY     Family History  Problem Relation Age of Onset   Heart disease Father    Hypertension Mother    Stroke Mother    Cancer Brother        lymphoma or leukemia   Heart disease Sister 30       sudden death   Social History   Socioeconomic History   Marital status: Married    Spouse name: ruth   Number of children: 2   Years of education: Not on file   Highest education level: Not on file  Occupational History   Occupation: retired from [employer]   Occupation: Equities trader in [place]   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    Comment: [employer]  Tobacco Use   Smoking status: Never   Smokeless tobacco: Never  Substance and Sexual Activity   Alcohol use: Yes    Comment: social use   Drug use: No   Sexual activity: Yes  Other Topics Concern   Not on file  Social History Narrative   Pt has a  total of 9 siblings---he is the youngest of 10   Social Determinants of Health   Financial Resource Strain: Low Risk  (02/21/2023)   Overall Financial Resource Strain (CARDIA)    Difficulty of Paying Living Expenses: Not hard at all  Food Insecurity: No Food Insecurity (02/21/2023)   Hunger Vital Sign    Worried About Running Out of Food in the Last Year: Never true    Ran Out of Food in the Last Year: Never true  Transportation Needs: No Transportation Needs (02/21/2023)   PRAPARE - Administrator, Civil Service (Medical): No    Lack of Transportation (Non-Medical): No  Physical Activity: Sufficiently Active (02/21/2023)   Exercise Vital Sign    Days of Exercise per Week: 5 days    Minutes of Exercise per Session: 40 min   Stress: No Stress Concern Present (02/21/2023)   [employer] of Occupational Health - Occupational Stress Questionnaire    Feeling of Stress : Not at all  Social Connections: Unknown (02/21/2023)   Social Connection and Isolation Panel [NHANES]    Frequency of Communication with Friends and Family: More than three times a week    Frequency of Social Gatherings with Friends and Family: More than three times a week    Attends Religious Services: Not on Marketing executive or Organizations: Yes    Attends Engineer, structural: More than 4 times per year    Marital Status: Married    Tobacco Counseling Counseling given: Not Answered   Clinical Intake:  Pre-visit preparation completed: Yes  Pain : No/denies pain Pain Score: 0-No pain     BMI - recorded: 27.92 Nutritional Status: BMI 25 -29 Overweight Nutritional Risks: None Diabetes: No  How often do you need to have someone help you when you read instructions, pamphlets, or other written materials from your doctor or pharmacy?: 1 - Never What is the last grade level you completed in school?: 3.5 years of college  Interpreter Needed?: No  Information entered by :: Marquet Faircloth N. Vena Bassinger, LPN.   Activities of Daily Living    02/21/2023   11:15 AM 02/18/2023    6:04 PM  In your present state of health, do you have any difficulty performing the following activities:  Hearing? 0 0  Vision? 0 0  Difficulty concentrating or making decisions? 0 0  Walking or climbing stairs? 0 0  Dressing or bathing? 0 0  Doing errands, shopping? 0 0  Preparing Food and eating ? N N  Using the Toilet? N N  In the past six months, have you accidently leaked urine? Y Y  Do you have problems with loss of bowel control? N N  Managing your Medications? N N  Managing your Finances? N N  Housekeeping or managing your Housekeeping? N N    Patient Care Team: Plotnikov, Georgina Quint, MD as PCP - General (Internal  Medicine) O'Neal, Ronnald Ramp, MD as PCP - Cardiology (Cardiology) Mixon, Kateri Mc (Gastroenterology)  Indicate any recent Medical Services you may have received from other than [facility] providers in the past year (date may be approximate).     Assessment:   This is a routine wellness examination for Jared Roth.  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 Vision Screening - Comments:: Wears rx glasses - up to date with routine eye exams with [facility]   Dietary issues and exercise activities discussed:  Goals Addressed             This Visit's Progress    My healthcare goal for 2024 is to maintain my current health status by continuing to eat healthy, stay independent, physically and socially active.        Depression Screen    02/21/2023   10:30 AM 08/08/2022   10:07 AM 02/19/2022    8:57 AM 02/19/2022    8:52 AM 07/31/2021    8:47 AM 02/09/2021    9:01 AM 01/25/2021   10:34 AM  PHQ 2/9 Scores  PHQ - 2 Score 3 0 0 0 0 0 0  PHQ- 9 Score 3 0   0 0 0    Fall Risk    02/21/2023   11:15 AM 02/18/2023    6:04 PM 08/08/2022   10:07 AM 02/19/2022    8:57 AM 07/31/2021    8:46 AM  Fall Risk   Falls in the past year? 0 0 0 0 1  Number falls in past yr: 0  0 0 0  Comment     pt states he was having a dizzy spell, fell once but did not get hurt  Injury with Fall? 0  0 0 0  Risk for fall due to : No Fall Risks  No Fall Risks No Fall Risks No Fall Risks  Follow up Falls prevention discussed   Falls evaluation completed     MEDICARE RISK AT HOME:  Medicare Risk at Home - 02/21/23 1115     Any stairs in or around the home? Yes    If so, are there any without handrails? No    Home free of loose throw rugs in walkways, pet beds, electrical cords, etc? Yes    Adequate lighting in your home to reduce risk of falls? Yes    Life alert? No    Use of a cane, walker or w/c? No    Grab bars in the bathroom? Yes     Shower chair or bench in shower? No    Elevated toilet seat or a handicapped toilet? No             TIMED UP AND GO:  Was the test performed?  Yes  Length of time to ambulate 10 feet: 8 sec Gait steady and fast without use of assistive device    Cognitive Function:        02/21/2023   11:15 AM 01/13/2020    8:28 AM  6CIT Screen  What Year? 0 points 0 points  What month? 0 points 0 points  What time? 0 points 0 points  Count back from 20 0 points 0 points  Months in reverse 0 points 0 points  Repeat phrase 0 points 0 points  Total Score 0 points 0 points    Immunizations Immunization History  Administered Date(s) Administered   Fluad Quad(high Dose 65+) 04/30/2019, 06/15/2020, 06/07/2022   Influenza, High Dose Seasonal PF 06/08/2014, 08/02/2015, 06/18/2017, 04/23/2018, 06/27/2021   Influenza,inj,Quad PF,6+ Mos 06/08/2014, 08/02/2015, 06/18/2017, 04/23/2018   Moderna Sars-Covid-2 Vaccination 09/26/2019, 10/26/2019, 06/30/2020, 12/27/2020   Pneumococcal Conjugate-13 01/13/2020   Pneumococcal Polysaccharide-23 02/09/2021   Tdap 12/07/2013   Zoster Recombinat (Shingrix) 01/23/2018, 03/12/2018    TDAP status: Up to date  Flu Vaccine status: Up to date  Pneumococcal vaccine status: Up to date  Covid-19 vaccine status: Completed vaccines  Qualifies for Shingles Vaccine? Yes   Zostavax completed No   Shingrix Completed?: Yes  Screening Tests Health  Maintenance  Topic Date Due   Hepatitis C Screening  Never done   COVID-19 Vaccine (5 - 2023-24 season) 04/27/2022   INFLUENZA VACCINE  03/28/2023   DTaP/Tdap/Td (2 - Td or Tdap) 12/08/2023   Medicare Annual Wellness (AWV)  02/21/2024   Colonoscopy  01/26/2026   Pneumonia Vaccine 21+ Years old  Completed   Zoster Vaccines- Shingrix  Completed   HPV VACCINES  Aged Out    Health Maintenance  Health Maintenance Due  Topic Date Due   Hepatitis C Screening  Never done   COVID-19 Vaccine (5 - 2023-24 season)  04/27/2022    Colorectal cancer screening: Type of screening: Colonoscopy. Completed 01/27/2016. Repeat every 10 years  Lung Cancer Screening: (Low Dose CT Chest recommended if Age 49-80 years, 20 pack-year currently smoking OR have quit w/in 15years.) does not qualify.   Lung Cancer Screening Referral: no  Additional Screening:  Hepatitis C Screening: does qualify; Completed no  Vision Screening: Recommended annual ophthalmology exams for early detection of glaucoma and other disorders of the eye. Is the patient up to date with their annual eye exam?  Yes  Who is the provider or what is the name of the office in which the patient attends annual eye exams? [facility] If pt is not established with a provider, would they like to be referred to a provider to establish care? No .   Dental Screening: Recommended annual dental exams for proper oral hygiene  Diabetic Foot Exam: N/A  Community Resource Referral / Chronic Care Management: CRR required this visit?  No   CCM required this visit?  No     Plan:     I have personally reviewed and noted the following in the patient's chart:   Medical and social history Use of alcohol, tobacco or illicit drugs  Current medications and supplements including opioid prescriptions. Patient is not currently taking opioid prescriptions. Functional ability and status Nutritional status Physical activity Advanced directives List of other physicians Hospitalizations, surgeries, and ER visits in previous 12 months Vitals Screenings to include cognitive, depression, and falls Referrals and appointments  In addition, I have reviewed and discussed with patient certain preventive protocols, quality metrics, and best practice recommendations. A written personalized care plan for preventive services as well as general preventive health recommendations were provided to patient.     Mickeal Needy, LPN   1/61/0960   After Visit Summary:  Printed for patient.  Nurse Notes: Normal cognitive status assessed by direct observation by this Nurse Health Advisor. No abnormalities found.   Medical screening examination/treatment/procedure(s) were performed by non-physician practitioner and as supervising physician I was immediately available for consultation/collaboration.  I agree with above. Jacinta Shoe, MD

## 2023-04-30 DIAGNOSIS — H524 Presbyopia: Secondary | ICD-10-CM | POA: Diagnosis not present

## 2023-04-30 DIAGNOSIS — H35033 Hypertensive retinopathy, bilateral: Secondary | ICD-10-CM | POA: Diagnosis not present

## 2023-08-12 ENCOUNTER — Ambulatory Visit (INDEPENDENT_AMBULATORY_CARE_PROVIDER_SITE_OTHER): Payer: Medicare HMO | Admitting: Internal Medicine

## 2023-08-12 ENCOUNTER — Encounter: Payer: Self-pay | Admitting: Internal Medicine

## 2023-08-12 VITALS — BP 110/70 | HR 61 | Temp 98.6°F | Ht 66.0 in | Wt 170.0 lb

## 2023-08-12 DIAGNOSIS — I10 Essential (primary) hypertension: Secondary | ICD-10-CM

## 2023-08-12 DIAGNOSIS — Z0001 Encounter for general adult medical examination with abnormal findings: Secondary | ICD-10-CM | POA: Diagnosis not present

## 2023-08-12 DIAGNOSIS — R202 Paresthesia of skin: Secondary | ICD-10-CM | POA: Diagnosis not present

## 2023-08-12 DIAGNOSIS — Z125 Encounter for screening for malignant neoplasm of prostate: Secondary | ICD-10-CM | POA: Diagnosis not present

## 2023-08-12 DIAGNOSIS — R972 Elevated prostate specific antigen [PSA]: Secondary | ICD-10-CM | POA: Diagnosis not present

## 2023-08-12 DIAGNOSIS — Z0000 Encounter for general adult medical examination without abnormal findings: Secondary | ICD-10-CM

## 2023-08-12 LAB — VITAMIN B12: Vitamin B-12: 1325 pg/mL — ABNORMAL HIGH (ref 211–911)

## 2023-08-12 LAB — CBC WITH DIFFERENTIAL/PLATELET
Basophils Absolute: 0 10*3/uL (ref 0.0–0.1)
Basophils Relative: 0.8 % (ref 0.0–3.0)
Eosinophils Absolute: 0 10*3/uL (ref 0.0–0.7)
Eosinophils Relative: 1 % (ref 0.0–5.0)
HCT: 41.8 % (ref 39.0–52.0)
Hemoglobin: 14 g/dL (ref 13.0–17.0)
Lymphocytes Relative: 33.8 % (ref 12.0–46.0)
Lymphs Abs: 1.5 10*3/uL (ref 0.7–4.0)
MCHC: 33.5 g/dL (ref 30.0–36.0)
MCV: 94.9 fL (ref 78.0–100.0)
Monocytes Absolute: 0.6 10*3/uL (ref 0.1–1.0)
Monocytes Relative: 12.6 % — ABNORMAL HIGH (ref 3.0–12.0)
Neutro Abs: 2.3 10*3/uL (ref 1.4–7.7)
Neutrophils Relative %: 51.8 % (ref 43.0–77.0)
Platelets: 254 10*3/uL (ref 150.0–400.0)
RBC: 4.4 Mil/uL (ref 4.22–5.81)
RDW: 13 % (ref 11.5–15.5)
WBC: 4.5 10*3/uL (ref 4.0–10.5)

## 2023-08-12 LAB — COMPREHENSIVE METABOLIC PANEL
ALT: 31 U/L (ref 0–53)
AST: 29 U/L (ref 0–37)
Albumin: 4.4 g/dL (ref 3.5–5.2)
Alkaline Phosphatase: 75 U/L (ref 39–117)
BUN: 12 mg/dL (ref 6–23)
CO2: 30 meq/L (ref 19–32)
Calcium: 9.5 mg/dL (ref 8.4–10.5)
Chloride: 102 meq/L (ref 96–112)
Creatinine, Ser: 0.95 mg/dL (ref 0.40–1.50)
GFR: 81.29 mL/min (ref >60.00)
Glucose, Bld: 93 mg/dL (ref 70–99)
Potassium: 4.7 meq/L (ref 3.5–5.1)
Sodium: 140 meq/L (ref 135–145)
Total Bilirubin: 0.6 mg/dL (ref 0.2–1.2)
Total Protein: 7.7 g/dL (ref 6.0–8.3)

## 2023-08-12 LAB — LIPID PANEL
Cholesterol: 139 mg/dL (ref 0–200)
HDL: 57.2 mg/dL (ref >39.00)
LDL Cholesterol: 69 mg/dL (ref 0–99)
NonHDL: 82.02
Total CHOL/HDL Ratio: 2
Triglycerides: 63 mg/dL (ref 0.0–149.0)
VLDL: 12.6 mg/dL (ref 0.0–40.0)

## 2023-08-12 LAB — URINALYSIS, ROUTINE W REFLEX MICROSCOPIC
Bilirubin Urine: NEGATIVE
Hgb urine dipstick: NEGATIVE
Ketones, ur: NEGATIVE
Nitrite: NEGATIVE
RBC / HPF: NONE SEEN (ref 0–?)
Specific Gravity, Urine: 1.01 (ref 1.000–1.030)
Total Protein, Urine: NEGATIVE
Urine Glucose: NEGATIVE
Urobilinogen, UA: 0.2 (ref 0.0–1.0)
pH: 7 (ref 5.0–8.0)

## 2023-08-12 LAB — PSA: PSA: 2.18 ng/mL (ref 0.10–4.00)

## 2023-08-12 LAB — TSH: TSH: 1.03 u[IU]/mL (ref 0.35–5.50)

## 2023-08-12 NOTE — Patient Instructions (Signed)
We can order a Cologuard test

## 2023-08-12 NOTE — Progress Notes (Signed)
Subjective:  Patient ID: Jared Roth, male    DOB: 06-15-1951  Age: 72 y.o. MRN: [id number]  CC: Annual Exam (Pt states he is feeling numbness and tingling in both arms into his finger tips at random moments through out the day.)   HPI Jared Roth presents for a well exam C/o R arm numbness and tingling when exercising x6 months R>L  Outpatient Medications Prior to Visit  Medication Sig Dispense Refill   amLODipine (NORVASC) 5 MG tablet TAKE 1 TABLET EVERY DAY 90 tablet 3   b complex vitamins tablet Take 1 tablet by mouth daily. 100 tablet 3   Cholecalciferol (VITAMIN D3) 2000 units capsule Take 1 capsule (2,000 Units total) by mouth daily. 100 capsule 3   rosuvastatin (CRESTOR) 40 MG tablet TAKE 1 TABLET DAILY. ANNUAL APPT DUE IN DECEMBER MUST SEE PROVIDER FOR FUTURE REFILLS. 90 tablet 3   telmisartan (MICARDIS) 80 MG tablet TAKE 1 TABLET EVERY DAY (ANNUAL APPOINTMENT DUE IN [place], MUST SEE PROVIDER FOR FURTHER REFILLS) 90 tablet 3   No facility-administered medications prior to visit.    ROS: Review of Systems  Constitutional:  Negative for appetite change, fatigue and unexpected weight change.  HENT:  Negative for congestion, nosebleeds, sneezing, sore throat and trouble swallowing.   Eyes:  Negative for itching and visual disturbance.  Respiratory:  Negative for cough.   Cardiovascular:  Negative for chest pain, palpitations and leg swelling.  Gastrointestinal:  Negative for abdominal distention, blood in stool, diarrhea and nausea.  Genitourinary:  Negative for frequency and hematuria.  Musculoskeletal:  Negative for back pain, gait problem, joint swelling and neck pain.  Skin:  Negative for rash.  Neurological:  Negative for dizziness, tremors, speech difficulty and weakness.  Psychiatric/Behavioral:  Negative for agitation, dysphoric mood and sleep disturbance. The patient is not nervous/anxious.   Neck/shoulders w/full ROM  Objective:  BP 110/70 (BP Location:  Right Arm, Patient Position: Sitting, Cuff Size: Normal)   Pulse 61   Temp 98.6 F (37 C) (Oral)   Ht 5\' 6"  (1.676 m)   Wt 170 lb (77.1 kg)   SpO2 97%   BMI 27.44 kg/m   BP Readings from Last 3 Encounters:  08/12/23 110/70  02/21/23 (!) 160/80  08/31/22 (!) 140/78    Wt Readings from Last 3 Encounters:  08/12/23 170 lb (77.1 kg)  02/21/23 173 lb (78.5 kg)  08/31/22 174 lb 3.2 oz (79 kg)    Physical Exam Constitutional:      General: He is not in acute distress.    Appearance: Normal appearance. He is well-developed.     Comments: NAD  Eyes:     Conjunctiva/sclera: Conjunctivae normal.     Pupils: Pupils are equal, round, and reactive to light.  Neck:     Thyroid: No thyromegaly.     Vascular: No JVD.  Cardiovascular:     Rate and Rhythm: Normal rate and regular rhythm.     Heart sounds: Normal heart sounds. No murmur heard.    No friction rub. No gallop.  Pulmonary:     Effort: Pulmonary effort is normal. No respiratory distress.     Breath sounds: Normal breath sounds. No wheezing or rales.  Chest:     Chest wall: No tenderness.  Abdominal:     General: Bowel sounds are normal. There is no distension.     Palpations: Abdomen is soft. There is no mass.     Tenderness: There is no abdominal tenderness. There  is no guarding or rebound.  Genitourinary:    Prostate: Normal.     Rectum: Normal. Guaiac result negative.  Musculoskeletal:        General: No tenderness. Normal range of motion.     Cervical back: Normal range of motion.  Lymphadenopathy:     Cervical: No cervical adenopathy.  Skin:    General: Skin is warm and dry.     Findings: No rash.  Neurological:     Mental Status: He is alert and oriented to person, place, and time.     Cranial Nerves: No cranial nerve deficit.     Motor: No abnormal muscle tone.     Coordination: Coordination normal.     Gait: Gait normal.     Deep Tendon Reflexes: Reflexes are normal and symmetric.  Psychiatric:         Behavior: Behavior normal.        Thought Content: Thought content normal.        Judgment: Judgment normal.   Pt looks very young Prostate 1+   Lab Results  Component Value Date   WBC 4.3 08/08/2022   HGB 13.6 08/08/2022   HCT 40.6 08/08/2022   PLT 234.0 08/08/2022   GLUCOSE 98 08/08/2022   CHOL 133 08/08/2022   TRIG 62.0 08/08/2022   HDL 56.10 08/08/2022   LDLDIRECT 172.5 09/02/2006   LDLCALC 65 08/08/2022   ALT 23 08/08/2022   AST 25 08/08/2022   NA 140 08/08/2022   K 4.2 08/08/2022   CL 103 08/08/2022   CREATININE 0.92 08/08/2022   BUN 10 08/08/2022   CO2 31 08/08/2022   TSH 0.72 08/08/2022   PSA 1.90 08/08/2022    CT CORONARY MORPH W/CTA COR W/SCORE W/CA W/CM &/OR WO/CM Addendum Date: 10/05/2019 ADDENDUM REPORT: 10/05/2019 18:59 CLINICAL DATA:  Chest pain/syncope EXAM: Cardiac/Coronary CTA TECHNIQUE: The patient was scanned on a [employer]. A 100 kV prospective scan was triggered in the descending thoracic aorta at 111 HU's. Axial non-contrast 3 mm slices were carried out through the heart. The data set was analyzed on a dedicated work station and scored using the Agatson method. Gantry rotation speed was 250 msecs and collimation was .6 mm. No beta blockade and 0.8 mg of sl NTG was given. The 3D data set was reconstructed in 5% intervals of the 35-75 % of the R-R cycle. Diastolic phases were analyzed on a dedicated work station using MPR, MIP and VRT modes. The patient received 80 cc of contrast. FINDINGS: Image quality: excellent. Noise artifact is: Limited. Coronary Arteries:  Normal coronary origin.  Right dominance. Left main: The left main is a large caliber vessel with a normal take off from the left coronary cusp that trifurcates into a LAD, LCX, and ramus intermedius. There is no plaque or stenosis. Left anterior descending artery: The proximal LAD is patent without plaque or stenosis. The minimal LAD contains mild non-calcified plaque (<25%). The distal LAD  contains mild calcified plaque (25-49%). The first diagonal contains mild calcified plaque (25-49%). Ramus intermedius: The ramus intermedius contains mild mixed density plaque (25-49%). Left circumflex artery: The LCX is non-dominant. The proximal LCX contains minimal calcified plaque (<25%). There is mild non-calcified plaque (25-49%) in the distal LCX. The LCX gives off 2 patent obtuse marginal branches. Right coronary artery: The RCA is dominant with normal take off from the right coronary cusp. The proximal RCA is patent. The mid RCA contains a mild non-calcified plaque (25-49%). There is artifact in this  region due to an acute marginal branch, but this lesion appears mild. The distal RCA is patent. The RCA terminates as a PDA and right posterolateral branch without evidence of plaque or stenosis. Right Atrium: Right atrial size is within normal limits. Right Ventricle: The right ventricular cavity is within normal limits. Left Atrium: Left atrial size is normal in size with no left atrial appendage filling defect. A small PFO is noted. Left Ventricle: The ventricular cavity size is within normal limits. There are no stigmata of prior infarction. There is no abnormal filling defect. Pulmonary arteries: Normal in size without proximal filling defect. Pulmonary veins: Normal pulmonary venous drainage. Pericardium: Normal thickness with no significant effusion or calcium present. Cardiac valves: The aortic valve is trileaflet without significant calcification. The mitral valve is normal structure without significant calcification. Aorta: Normal caliber with no significant disease. Extra-cardiac findings: See attached radiology report for non-cardiac structures. IMPRESSION: 1. Coronary calcium score of 172. This was 80th percentile for age and sex matched control. 2. Normal coronary origin with right dominance. 3. 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 4. Small PFO noted. RECOMMENDATIONS: 1. Mild  non-obstructive CAD (25-49%). Consider non-atherosclerotic causes of chest pain. Consider preventive therapy and risk factor modification. Lennie Odor, MD Electronically Signed   By: Lennie Odor   On: 10/05/2019 18:59   Result Date: 10/05/2019 EXAM: OVER-READ INTERPRETATION  CT CHEST The following report is an over-read performed by radiologist Dr. Irish Lack of [facility] on 10/05/2019. This over-read does not include interpretation of cardiac or coronary anatomy or pathology. The coronary CTA interpretation by the cardiologist is attached. COMPARISON:  Coronary calcium study on 07/04/2018 FINDINGS: Vascular: No incidental findings. Mediastinum/Nodes: Visualized mediastinum and hilar regions demonstrate no lymphadenopathy or focal masses. Lungs/Pleura: There is no evidence of pulmonary edema, consolidation, pneumothorax, nodule or pleural fluid. Upper Abdomen: No acute abnormality. Musculoskeletal: No chest wall mass or suspicious bone lesions identified. IMPRESSION: No incidental findings. Electronically Signed: By: Irish Lack M.D. On: 10/05/2019 11:38    Assessment & Plan:   Problem List Items Addressed This Visit     Essential hypertension   Cont on Micardis, Norvasc BP ok at home      Elevated PSA   Check PSA      Well adult exam - Primary   We discussed age appropriate health related issues, including available/recomended screening tests and vaccinations. Labs were ordered to be later reviewed . All questions were answered. We discussed one or more of the following - seat belt use, use of sunscreen/sun exposure exercise, safe sex, fall risk reduction, second hand smoke exposure, firearm use and storage, seat belt use, a need for adhering to healthy diet and exercise. Labs were ordered.  All questions were answered. Colon due in 2027 (last 2017) Dr Gwenevere Abbot CT Ca score 11/19 - Coronary arteries: Mild calcium noted in all 3 major epicardial coronary arteries.  Coronary calcium score of 81.  We can order a Cologuard test      Relevant Orders   TSH   Urinalysis   CBC with Differential/Platelet   Lipid panel   PSA   Comprehensive metabolic panel   Paresthesia   New R arm numbness and tingling when exercising x6 months R>L ?Plexopathy vs other Sports Med ref      Relevant Orders   Vitamin B12   Ambulatory referral to Sports Medicine      No orders of the defined types were placed in this encounter.  Follow-up: Return in about 6 months (around 02/10/2024) for a follow-up visit.  Sonda Primes, MD

## 2023-08-12 NOTE — Assessment & Plan Note (Signed)
Check PSA. ?

## 2023-08-12 NOTE — Assessment & Plan Note (Signed)
New R arm numbness and tingling when exercising x6 months R>L ?Plexopathy vs other Sports Med ref

## 2023-08-12 NOTE — Assessment & Plan Note (Signed)
Cont on Micardis, Norvasc BP ok at home

## 2023-08-12 NOTE — Assessment & Plan Note (Addendum)
We discussed age appropriate health related issues, including available/recomended screening tests and vaccinations. Labs were ordered to be later reviewed . All questions were answered. We discussed one or more of the following - seat belt use, use of sunscreen/sun exposure exercise, safe sex, fall risk reduction, second hand smoke exposure, firearm use and storage, seat belt use, a need for adhering to healthy diet and exercise. Labs were ordered.  All questions were answered. Colon due in 2027 (last 2017) Dr Gwenevere Abbot CT Ca score 11/19 - Coronary arteries: Mild calcium noted in all 3 major epicardial coronary arteries. Coronary calcium score of 81.  We can order a Cologuard test

## 2023-08-30 DIAGNOSIS — B309 Viral conjunctivitis, unspecified: Secondary | ICD-10-CM | POA: Diagnosis not present

## 2023-09-03 ENCOUNTER — Ambulatory Visit: Payer: Medicare Other | Admitting: Family Medicine

## 2023-09-03 ENCOUNTER — Encounter: Payer: Self-pay | Admitting: Family Medicine

## 2023-09-03 ENCOUNTER — Ambulatory Visit (INDEPENDENT_AMBULATORY_CARE_PROVIDER_SITE_OTHER): Payer: Medicare Other

## 2023-09-03 VITALS — BP 150/92 | HR 69 | Ht 66.0 in | Wt 174.0 lb

## 2023-09-03 DIAGNOSIS — M79601 Pain in right arm: Secondary | ICD-10-CM

## 2023-09-03 DIAGNOSIS — M79602 Pain in left arm: Secondary | ICD-10-CM | POA: Diagnosis not present

## 2023-09-03 DIAGNOSIS — M47812 Spondylosis without myelopathy or radiculopathy, cervical region: Secondary | ICD-10-CM | POA: Diagnosis not present

## 2023-09-03 NOTE — Progress Notes (Signed)
   I, Leotis Batter, CMA acting as a scribe for Artist Lloyd, MD.  Jared Roth is a 73 y.o. male who presents to [employer] [facility] at [facility] today for bilat arm pain and paresthesia, R>L, ongoing for 6 months. Pt locates bilateral arms. Sleep with a pillow under the arm while side-lying. N/T extends from the axillary region into the fingers. Neck feels OK. Sx also exacerbated while using the mouse at work. Pt is RHD. Notes that the shoulder will spasm if rested on a hard surface. Notes n/t in the arms while walking on the treadmill. Some relief while stretching the arm across the body.   Neck pain: no Radiates: axillary to fingers Paresthesia: axillary to fingers Grip strength: no Aggravates: as above Treatments tried: changes in sleep position.   Pertinent review of systems: No fevers or chills  Relevant historical information: Hypertension   Exam:  BP (!) 150/92   Pulse 69   Ht 5' 6 (1.676 m)   Wt 174 lb (78.9 kg)   SpO2 99%   BMI 28.08 kg/m  General: Well Developed, well nourished, and in no acute distress.   MSK: C-spine: Normal appearing Nontender palpation cervical mid spine normal cervical motion approximately strength and reflexes are intact.   Negative Spurling's test.  Carpal tunnel.  Positive Phalen's test bilaterally negative Tinel's test.  Grip strength is intact.  Lab and Radiology Results  X-ray images cervical spine obtained today personally and independently interpreted. 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  No acute fractures are visible. Await formal radiology review     Assessment and Plan: 73 y.o. male with paresthesia of bilateral arms and hands.  The distribution is not perfect for C6 or C7.  His distribution is much more consistent with carpal tunnel syndrome but he has paresthesias more proximally than the carpal tunnel also.  Plan for carpal tunnel wrist brace trial.  If not improving neck step would be nerve conduction study.  Consider MRI cervical spine  prednisone gabapentin as well.  Also consider physical therapy.  He will send me an update in a few weeks limit how he is feeling.   PDMP not reviewed this encounter. Orders Placed This Encounter  Procedures   DG Cervical Spine 2 or 3 views    Standing Status:   Future    Number of Occurrences:   1    Expiration Date:   09/02/2024    Reason for Exam (SYMPTOM  OR DIAGNOSIS REQUIRED):   n/t bilat arms    Preferred imaging location?:   [facility]   No orders of the defined types were placed in this encounter.    Discussed warning signs or symptoms. Please see discharge instructions. Patient expresses understanding.   The above documentation has been reviewed and is accurate and complete Artist Lloyd, M.D.

## 2023-09-03 NOTE — Patient Instructions (Addendum)
 Thank you for coming in today.  Please get an Xray today before you leave   Carpal tunnel wrist brace  Check back in 1 month

## 2023-09-06 DIAGNOSIS — B309 Viral conjunctivitis, unspecified: Secondary | ICD-10-CM | POA: Diagnosis not present

## 2023-09-09 NOTE — Progress Notes (Signed)
 Cervical spine x-ray shows some arthritis changes.  No broken bones are visible.

## 2024-02-06 ENCOUNTER — Encounter: Payer: Self-pay | Admitting: Internal Medicine

## 2024-02-06 ENCOUNTER — Ambulatory Visit: Admitting: Internal Medicine

## 2024-02-06 VITALS — BP 138/84 | HR 59 | Temp 98.0°F | Ht 66.0 in | Wt 168.0 lb

## 2024-02-06 DIAGNOSIS — I10 Essential (primary) hypertension: Secondary | ICD-10-CM | POA: Diagnosis not present

## 2024-02-06 DIAGNOSIS — R21 Rash and other nonspecific skin eruption: Secondary | ICD-10-CM | POA: Diagnosis not present

## 2024-02-06 MED ORDER — TRIAMCINOLONE ACETONIDE 0.5 % EX CREA: 1.0000 | TOPICAL_CREAM | Freq: Four times a day (QID) | CUTANEOUS | 1 refills | Status: AC

## 2024-02-06 MED ORDER — METHYLPREDNISOLONE 4 MG PO TBPK
ORAL_TABLET | ORAL | 0 refills | Status: DC
Start: 2024-02-06 — End: 2024-03-23

## 2024-02-06 NOTE — Progress Notes (Signed)
 Subjective:  Patient ID: Jared Roth, male    DOB: 1951/07/16  Age: 73 y.o. MRN: [id number]  CC: Medical Management of Chronic Issues (Since December he has has a blister below his rib cage Pt states he picks at it.. Pt states it has scab over now but not smoothing want xray to see that there its any other issue. Pt states when he bend to either side he gets cramping sometimes.)   HPI Javid E Weatherall presents for scaly rash patch on the R rib cage x 6 month, he had other patches that disappeared  Outpatient Medications Prior to Visit  Medication Sig Dispense Refill   amLODipine  (NORVASC ) 5 MG tablet TAKE 1 TABLET EVERY DAY 90 tablet 3   b complex vitamins tablet Take 1 tablet by mouth daily. 100 tablet 3   Cholecalciferol (VITAMIN D3) 2000 units capsule Take 1 capsule (2,000 Units total) by mouth daily. 100 capsule 3   rosuvastatin  (CRESTOR ) 40 MG tablet TAKE 1 TABLET DAILY. ANNUAL APPT DUE IN DECEMBER MUST SEE PROVIDER FOR FUTURE REFILLS. 90 tablet 3   telmisartan  (MICARDIS ) 80 MG tablet TAKE 1 TABLET EVERY DAY (ANNUAL APPOINTMENT DUE IN [place], MUST SEE PROVIDER FOR FURTHER REFILLS) 90 tablet 3   No facility-administered medications prior to visit.    ROS: Review of Systems  Constitutional:  Negative for appetite change, fatigue and unexpected weight change.  HENT:  Negative for congestion, nosebleeds, sneezing, sore throat and trouble swallowing.   Eyes:  Negative for itching and visual disturbance.  Respiratory:  Negative for cough.   Cardiovascular:  Negative for chest pain, palpitations and leg swelling.  Gastrointestinal:  Negative for abdominal distention, blood in stool, diarrhea and nausea.  Genitourinary:  Negative for frequency and hematuria.  Musculoskeletal:  Negative for back pain, gait problem, joint swelling and neck pain.  Skin:  Positive for color change and rash.  Neurological:  Negative for dizziness, tremors, speech difficulty and weakness.   Psychiatric/Behavioral:  Negative for agitation, dysphoric mood and sleep disturbance. The patient is not nervous/anxious.     Objective:  BP 138/84   Pulse (!) 59   Temp 98 F (36.7 C) (Oral)   Ht 5' 6 (1.676 m)   Wt 168 lb (76.2 kg)   SpO2 100%   BMI 27.12 kg/m   BP Readings from Last 3 Encounters:  02/06/24 138/84  09/03/23 (!) 150/92  08/12/23 110/70    Wt Readings from Last 3 Encounters:  02/06/24 168 lb (76.2 kg)  09/03/23 174 lb (78.9 kg)  08/12/23 170 lb (77.1 kg)    Physical Exam Constitutional:      General: He is not in acute distress.    Appearance: He is well-developed.     Comments: NAD   Eyes:     Conjunctiva/sclera: Conjunctivae normal.     Pupils: Pupils are equal, round, and reactive to light.   Neck:     Thyroid : No thyromegaly.     Vascular: No JVD.   Cardiovascular:     Rate and Rhythm: Normal rate and regular rhythm.     Heart sounds: Normal heart sounds. No murmur heard.    No friction rub. No gallop.  Pulmonary:     Effort: Pulmonary effort is normal. No respiratory distress.     Breath sounds: Normal breath sounds. No wheezing or rales.  Chest:     Chest wall: No tenderness.  Abdominal:     General: Bowel sounds are normal. There is no distension.  Palpations: Abdomen is soft. There is no mass.     Tenderness: There is no abdominal tenderness. There is no guarding or rebound.   Musculoskeletal:        General: No tenderness. Normal range of motion.     Cervical back: Normal range of motion.     Right lower leg: No edema.     Left lower leg: No edema.  Lymphadenopathy:     Cervical: No cervical adenopathy.   Skin:    General: Skin is warm and dry.     Findings: No rash.   Neurological:     Mental Status: He is alert and oriented to person, place, and time.     Cranial Nerves: No cranial nerve deficit.     Motor: No abnormal muscle tone.     Coordination: Coordination normal.     Gait: Gait normal.     Deep Tendon  Reflexes: Reflexes are normal and symmetric.   Psychiatric:        Behavior: Behavior normal.        Thought Content: Thought content normal.        Judgment: Judgment normal.     Lab Results  Component Value Date   WBC 4.5 08/12/2023   HGB 14.0 08/12/2023   HCT 41.8 08/12/2023   PLT 254.0 08/12/2023   GLUCOSE 93 08/12/2023   CHOL 139 08/12/2023   TRIG 63.0 08/12/2023   HDL 57.20 08/12/2023   LDLDIRECT 172.5 09/02/2006   LDLCALC 69 08/12/2023   ALT 31 08/12/2023   AST 29 08/12/2023   NA 140 08/12/2023   K 4.7 08/12/2023   CL 102 08/12/2023   CREATININE 0.95 08/12/2023   BUN 12 08/12/2023   CO2 30 08/12/2023   TSH 1.03 08/12/2023   PSA 2.18 08/12/2023    CT CORONARY MORPH W/CTA COR W/SCORE W/CA W/CM &/OR WO/CM Addendum Date: 10/05/2019 ADDENDUM REPORT: 10/05/2019 18:59 CLINICAL DATA:  Chest pain/syncope EXAM: Cardiac/Coronary CTA TECHNIQUE: The patient was scanned on a [employer]. A 100 kV prospective scan was triggered in the descending thoracic aorta at 111 HU's. Axial non-contrast 3 mm slices were carried out through the heart. The data set was analyzed on a dedicated work station and scored using the Agatson method. Gantry rotation speed was 250 msecs and collimation was .6 mm. No beta blockade and 0.8 mg of sl NTG was given. The 3D data set was reconstructed in 5% intervals of the 35-75 % of the R-R cycle. Diastolic phases were analyzed on a dedicated work station using MPR, MIP and VRT modes. The patient received 80 cc of contrast. FINDINGS: Image quality: excellent. Noise artifact is: Limited. Coronary Arteries:  Normal coronary origin.  Right dominance. Left main: The left main is a large caliber vessel with a normal take off from the left coronary cusp that trifurcates into a LAD, LCX, and ramus intermedius. There is no plaque or stenosis. Left anterior descending artery: The proximal LAD is patent without plaque or stenosis. The minimal LAD contains mild  non-calcified plaque (<25%). The distal LAD contains mild calcified plaque (25-49%). The first diagonal contains mild calcified plaque (25-49%). Ramus intermedius: The ramus intermedius contains mild mixed density plaque (25-49%). Left circumflex artery: The LCX is non-dominant. The proximal LCX contains minimal calcified plaque (<25%). There is mild non-calcified plaque (25-49%) in the distal LCX. The LCX gives off 2 patent obtuse marginal branches. Right coronary artery: The RCA is dominant with normal take off from the right coronary cusp.  The proximal RCA is patent. The mid RCA contains a mild non-calcified plaque (25-49%). There is artifact in this region due to an acute marginal branch, but this lesion appears mild. The distal RCA is patent. The RCA terminates as a PDA and right posterolateral branch without evidence of plaque or stenosis. Right Atrium: Right atrial size is within normal limits. Right Ventricle: The right ventricular cavity is within normal limits. Left Atrium: Left atrial size is normal in size with no left atrial appendage filling defect. A small PFO is noted. Left Ventricle: The ventricular cavity size is within normal limits. There are no stigmata of prior infarction. There is no abnormal filling defect. Pulmonary arteries: Normal in size without proximal filling defect. Pulmonary veins: Normal pulmonary venous drainage. Pericardium: Normal thickness with no significant effusion or calcium  present. Cardiac valves: The aortic valve is trileaflet without significant calcification. The mitral valve is normal structure without significant calcification. Aorta: Normal caliber with no significant disease. Extra-cardiac findings: See attached radiology report for non-cardiac structures. IMPRESSION: 1. Coronary calcium  score of 172. This was 80th percentile for age and sex matched control. 2. Normal coronary origin with right dominance. 3. 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 4.  Small PFO noted. RECOMMENDATIONS: 1. Mild non-obstructive CAD (25-49%). Consider non-atherosclerotic causes of chest pain. Consider preventive therapy and risk factor modification. Jackquelyn Mass, MD Electronically Signed   By: Jackquelyn Mass   On: 10/05/2019 18:59   Result Date: 10/05/2019 EXAM: OVER-READ INTERPRETATION  CT CHEST The following report is an over-read performed by radiologist Dr. Erica Hau of [facility] on 10/05/2019. This over-read does not include interpretation of cardiac or coronary anatomy or pathology. The coronary CTA interpretation by the cardiologist is attached. COMPARISON:  Coronary calcium  study on 07/04/2018 FINDINGS: Vascular: No incidental findings. Mediastinum/Nodes: Visualized mediastinum and hilar regions demonstrate no lymphadenopathy or focal masses. Lungs/Pleura: There is no evidence of pulmonary edema, consolidation, pneumothorax, nodule or pleural fluid. Upper Abdomen: No acute abnormality. Musculoskeletal: No chest wall mass or suspicious bone lesions identified. IMPRESSION: No incidental findings. Electronically Signed: By: Erica Hau M.D. On: 10/05/2019 11:38    Assessment & Plan:   Problem List Items Addressed This Visit     Essential hypertension   Cont on Micardis , Norvasc  BP ok at home      Rash - Primary   Pytiriasis rosea vs psoriasis vs other Medrol pack Triamc cream Skin bx, RPR if not gone soon         Meds ordered this encounter  Medications   methylPREDNISolone (MEDROL DOSEPAK) 4 MG TBPK tablet    Sig: As directed    Dispense:  21 tablet    Refill:  0   triamcinolone cream (KENALOG) 0.5 %    Sig: Apply 1 Application topically 4 (four) times daily.    Dispense:  45 g    Refill:  1      Follow-up: Return in about 3 months (around 05/08/2024) for a follow-up visit.  Anitra Barn, MD

## 2024-02-06 NOTE — Assessment & Plan Note (Signed)
 Cont on Micardis, Norvasc BP ok at home

## 2024-02-06 NOTE — Assessment & Plan Note (Addendum)
 Pytiriasis rosea vs psoriasis vs other Medrol pack Triamc cream Skin bx, RPR if not gone soon

## 2024-02-11 ENCOUNTER — Telehealth: Payer: Self-pay | Admitting: Internal Medicine

## 2024-02-11 NOTE — Telephone Encounter (Signed)
 Copied from CRM [id number]. Topic: Clinical - Medication Question >> Feb 11, 2024  2:31 PM Jared Roth wrote: Reason for CRM: Patient was prescribed a pill and a cream for a rash that he has. He read that the pills may cause dizziness,and would like to know if it is okay for him to just use the cream without the pills,he stated that he has vertigo in the past and do not like the dizzy feeling.

## 2024-02-13 NOTE — Telephone Encounter (Signed)
 He can try to use the cream by itself first. If not better in a couple weeks-take pills too Thanks

## 2024-02-27 ENCOUNTER — Ambulatory Visit: Payer: Medicare HMO

## 2024-02-27 VITALS — Ht 66.0 in | Wt 168.0 lb

## 2024-02-27 DIAGNOSIS — Z0000 Encounter for general adult medical examination without abnormal findings: Secondary | ICD-10-CM

## 2024-02-27 DIAGNOSIS — Z1159 Encounter for screening for other viral diseases: Secondary | ICD-10-CM | POA: Diagnosis not present

## 2024-02-27 NOTE — Progress Notes (Cosign Needed Addendum)
 Subjective:   Jared Roth is a 73 y.o. who presents for a Medicare Wellness preventive visit.  As a reminder, Annual Wellness Visits don't include a physical exam, and some assessments may be limited, especially if this visit is performed virtually. We may recommend an in-person follow-up visit with your provider if needed.  Visit Complete: Virtual I connected with  Jared Roth on 02/27/24 by a audio enabled telemedicine application and verified that I am speaking with the correct person using two identifiers.  Patient Location: Home  Provider Location: Office/Clinic  I discussed the limitations of evaluation and management by telemedicine. The patient expressed understanding and agreed to proceed.  Vital Signs: Because this visit was a virtual/telehealth visit, some criteria may be missing or patient reported. Any vitals not documented were not able to be obtained and vitals that have been documented are patient reported.  VideoDeclined- This patient declined Librarian, academic. Therefore the visit was completed with audio only.  Persons Participating in Visit: Patient.  AWV Questionnaire: No: Patient Medicare AWV questionnaire was not completed prior to this visit.  Cardiac Risk Factors include: advanced age (>64men, >89 women);dyslipidemia;male gender;hypertension     Objective:    Today's Vitals   02/27/24 0938  Weight: 168 lb (76.2 kg)  Height: 5' 6 (1.676 m)   Body mass index is 27.12 kg/m.     02/27/2024    9:36 AM 02/21/2023   10:26 AM 02/19/2022    8:54 AM 02/09/2021    9:38 AM 01/13/2020    8:26 AM  Advanced Directives  Does Patient Have a Medical Advance Directive? No Yes No Yes Yes  Type of Special educational needs teacher of [place];Living will  Living will Living will  Does patient want to make changes to medical advance directive?    No - Patient declined No - Patient declined  Copy of Healthcare Power of Attorney in Chart?   No - copy requested     Would patient like information on creating a medical advance directive? Yes (MAU/Ambulatory/Procedural Areas - Information given)  No - Patient declined      Current Medications (verified) Outpatient Encounter Medications as of 02/27/2024  Medication Sig   amLODipine  (NORVASC ) 5 MG tablet TAKE 1 TABLET EVERY DAY   b complex vitamins tablet Take 1 tablet by mouth daily.   Cholecalciferol (VITAMIN D3) 2000 units capsule Take 1 capsule (2,000 Units total) by mouth daily.   rosuvastatin  (CRESTOR ) 40 MG tablet TAKE 1 TABLET DAILY. ANNUAL APPT DUE IN DECEMBER MUST SEE PROVIDER FOR FUTURE REFILLS.   telmisartan  (MICARDIS ) 80 MG tablet TAKE 1 TABLET EVERY DAY (ANNUAL APPOINTMENT DUE IN [place], MUST SEE PROVIDER FOR FURTHER REFILLS)   methylPREDNISolone  (MEDROL  DOSEPAK) 4 MG TBPK tablet As directed (Patient not taking: Reported on 02/27/2024)   triamcinolone  cream (KENALOG ) 0.5 % Apply 1 Application topically 4 (four) times daily. (Patient not taking: Reported on 02/27/2024)   No facility-administered encounter medications on file as of 02/27/2024.    Allergies (verified) Atorvastatin and Sulfonamide derivatives   History: Past Medical History:  Diagnosis Date   Benign prostatic hypertrophy    HSV infection    Hypercholesteremia    Hypertension    Past Surgical History:  Procedure Laterality Date   CYST REMOVAL TRUNK     ELBOW SURGERY     Family History  Problem Relation Age of Onset   Heart disease Father    Hypertension Mother    Stroke Mother  Cancer Brother        lymphoma or leukemia   Heart disease Sister 34       sudden death   Social History   Socioeconomic History   Marital status: Married    Spouse name: Jared Roth   Number of children: 2   Years of education: Not on file   Highest education level: Some college, no degree  Occupational History   Occupation: retired from [employer]   Occupation: Equities trader in [place]   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    Comment: [employer]  Tobacco Use   Smoking status: Never   Smokeless tobacco: Never  Substance and Sexual Activity   Alcohol use: Not Currently   Drug use: No   Sexual activity: Yes  Other Topics Concern   Not on file  Social History Narrative   Pt has a total of 9 siblings---he is the youngest of 56   Social Drivers of Corporate investment banker Strain: Low Risk  (02/27/2024)   Overall Financial Resource Strain (CARDIA)    Difficulty of Paying Living Expenses: Not hard at all  Food Insecurity: No Food Insecurity (02/27/2024)   Hunger Vital Sign    Worried About Running Out of Food in the Last Year: Never true    Ran Out of Food in the Last Year: Never true  Transportation Needs: No Transportation Needs (02/27/2024)   PRAPARE - Administrator, Civil Service (Medical): No    Lack of Transportation (Non-Medical): No  Physical Activity: Sufficiently Active (02/27/2024)   Exercise Vital Sign    Days of Exercise per Week: 3 days    Minutes of Exercise per Session: 60 min  Stress: No Stress Concern Present (02/27/2024)   [employer] of Occupational Health - Occupational Stress Questionnaire    Feeling of Stress: Not at all  Social Connections: Socially Integrated (02/27/2024)   Social Connection and Isolation Panel    Frequency of Communication with Friends and Family: More than three times a week    Frequency of Social Gatherings with Friends and Family: Once a week    Attends Religious Services: More than 4 times per year    Active Member of [employer] or Organizations: No    Attends Engineer, structural: More than 4 times per year    Marital Status: Married    Tobacco Counseling Counseling given: No    Clinical Intake:  Pre-visit preparation completed: Yes  Pain : No/denies pain     BMI - recorded: 27.12 Nutritional Status: BMI 25 -29 Overweight Nutritional Risks: None Diabetes: No  No results found for: HGBA1C   How  often do you need to have someone help you when you read instructions, pamphlets, or other written materials from your doctor or pharmacy?: 1 - Never  Interpreter Needed?: No  Information entered by :: Jared Roth Saba, CMA   Activities of Daily Living     02/27/2024    9:44 AM  In your present state of health, do you have any difficulty performing the following activities:  Hearing? 0  Comment wears hearing aids  Vision? 0  Difficulty concentrating or making decisions? 0  Walking or climbing stairs? 0  Dressing or bathing? 0  Doing errands, shopping? 0  Preparing Food and eating ? N  Using the Toilet? N  In the past six months, have you accidently leaked urine? N  Do you have problems with loss of bowel control? N  Managing your  Medications? N  Managing your Finances? N  Housekeeping or managing your Housekeeping? N    Patient Care Team: Plotnikov, Karlynn GAILS, MD as PCP - General (Internal Medicine) O'Neal, Darryle Ned, MD as PCP - Cardiology (Cardiology) Mixon, Victory DASEN (Gastroenterology)  I have updated your Care Teams any recent Medical Services you may have received from other providers in the past year.     Assessment:   This is a routine wellness examination for Jared Roth.  Hearing/Vision screen Hearing Screening - Comments:: Wears hearing aids Vision Screening - Comments:: Wears eyeglasses for reading - up to date with routine eye exams with [facility]   Goals Addressed               This Visit's Progress     Patient Stated (pt-stated)        Patient stated he plans to continue keep exercising        Depression Screen     02/27/2024    9:46 AM 02/06/2024    2:46 PM 08/12/2023   10:13 AM 02/21/2023   10:30 AM 08/08/2022   10:07 AM 02/19/2022    8:57 AM 02/19/2022    8:52 AM  PHQ 2/9 Scores  PHQ - 2 Score 0 0 0 3 0 0 0  PHQ- 9 Score 0   3 0      Fall Risk     02/27/2024    9:44 AM 02/06/2024    2:45 PM 08/12/2023   10:13 AM 02/21/2023   11:15 AM  02/18/2023    6:04 PM  Fall Risk   Falls in the past year? 0 0 0 0 0  Number falls in past yr: 0 0 0 0   Injury with Fall? 0 0 0 0   Risk for fall due to : No Fall Risks  No Fall Risks No Fall Risks   Follow up Falls evaluation completed;Falls prevention discussed  Falls evaluation completed Falls prevention discussed     MEDICARE RISK AT HOME:  Medicare Risk at Home Any stairs in or around the home?: Yes If so, are there any without handrails?: No Home free of loose throw rugs in walkways, pet beds, electrical cords, etc?: Yes Adequate lighting in your home to reduce risk of falls?: Yes Life alert?: No Use of a cane, walker or w/c?: No Grab bars in the bathroom?: Yes Shower chair or bench in shower?: No Elevated toilet seat or a handicapped toilet?: Yes  TIMED UP AND GO:  Was the test performed?  No  Cognitive Function: 6CIT completed        02/27/2024   10:08 AM 02/21/2023   11:15 AM 01/13/2020    8:28 AM  6CIT Screen  What Year? 0 points 0 points 0 points  What month? 0 points 0 points 0 points  What time? 0 points 0 points 0 points  Count back from 20 0 points 0 points 0 points  Months in reverse 0 points 0 points 0 points  Repeat phrase 0 points 0 points 0 points  Total Score 0 points 0 points 0 points    Immunizations Immunization History  Administered Date(s) Administered   Fluad Quad(high Dose 65+) 04/30/2019, 06/15/2020, 06/07/2022   Influenza, High Dose Seasonal PF 06/08/2014, 08/02/2015, 06/18/2017, 04/23/2018, 06/27/2021   Influenza,inj,Quad PF,6+ Mos 06/08/2014, 08/02/2015, 06/18/2017, 04/23/2018   Moderna Sars-Covid-2 Vaccination 09/26/2019, 10/26/2019, 06/30/2020, 12/27/2020   Pfizer(Comirnaty)Fall Seasonal Vaccine 12 years and older 04/26/2023   Pneumococcal Conjugate-13 01/13/2020   Pneumococcal  Polysaccharide-23 02/09/2021   Tdap 12/07/2013   Zoster Recombinant(Shingrix ) 01/23/2018, 03/12/2018    Screening Tests Health Maintenance  Topic Date  Due   Hepatitis C Screening  Never done   COVID-19 Vaccine (6 - 2024-25 season) 10/25/2023   DTaP/Tdap/Td (2 - Td or Tdap) 12/08/2023   INFLUENZA VACCINE  03/27/2024   Medicare Annual Wellness (AWV)  02/26/2025   Colonoscopy  01/26/2026   Pneumococcal Vaccine: 50+ Years  Completed   Zoster Vaccines- Shingrix   Completed   Hepatitis B Vaccines  Aged Out   HPV VACCINES  Aged Out   Meningococcal B Vaccine  Aged Out    Health Maintenance  Health Maintenance Due  Topic Date Due   Hepatitis C Screening  Never done   COVID-19 Vaccine (6 - 2024-25 season) 10/25/2023   DTaP/Tdap/Td (2 - Td or Tdap) 12/08/2023   Health Maintenance Items Addressed:  Labs Ordered: Hepatitis C Screening   Additional Screening:  Vision Screening: Recommended annual ophthalmology exams for early detection of glaucoma and other disorders of the eye. Would you like a referral to an eye doctor? No  Patient stated that he has had an eye exam in early 2025 w/an Ophthalmologist at [facility].  Dental Screening: Recommended annual dental exams for proper oral hygiene  Community Resource Referral / Chronic Care Management: CRR required this visit?  No   CCM required this visit?  No   Plan:    I have personally reviewed and noted the following in the patient's chart:   Medical and social history Use of alcohol, tobacco or illicit drugs  Current medications and supplements including opioid prescriptions. Patient is not currently taking opioid prescriptions. Functional ability and status Nutritional status Physical activity Advanced directives List of other physicians Hospitalizations, surgeries, and ER visits in previous 12 months Vitals Screenings to include cognitive, depression, and falls Referrals and appointments  In addition, I have reviewed and discussed with patient certain preventive protocols, quality metrics, and best practice recommendations. A written personalized care plan for  preventive services as well as general preventive health recommendations were provided to patient.   Jared Roth CHRISTELLA Saba, CMA   02/27/2024   After Visit Summary: (MyChart) Due to this being a telephonic visit, the after visit summary with patients personalized plan was offered to patient via MyChart   Notes: Nothing significant to report at this time.  Medical screening examination/treatment/procedure(s) were performed by non-physician practitioner and as supervising physician I was immediately available for consultation/collaboration.  I agree with above. Karlynn Noel, MD

## 2024-02-27 NOTE — Telephone Encounter (Signed)
Spoke with patient today and info given. 

## 2024-02-27 NOTE — Patient Instructions (Signed)
 [name] , Thank you for taking time out of your busy schedule to complete your Annual Wellness Visit with me. I enjoyed our conversation and look forward to speaking with you again next year. I, as well as your care team,  appreciate your ongoing commitment to your health goals. Please review the following plan we discussed and let me know if I can assist you in the future. Your Game plan/ To Do List    Referrals: If you haven't heard from the office you've been referred to, please reach out to them at the phone provided.  Ordered a Hepatitis C Screening (Lab) Follow up Visits: Next Medicare AWV with our clinical staff: 03/02/2025   Have you seen your provider in the last 6 months (3 months if uncontrolled diabetes)? No Next Office Visit with your provider: to be scheduled by the patient  Clinician Recommendations:  Aim for 30 minutes of exercise or brisk walking, 6-8 glasses of water, and 5 servings of fruits and vegetables each day. Educated and advised on getting the Tdap (Tetenus) vaccine in 2025 at local pharmacy.      This is a list of the screening recommended for you and due dates:  Health Maintenance  Topic Date Due   Hepatitis C Screening  Never done   COVID-19 Vaccine (6 - 2024-25 season) 10/25/2023   DTaP/Tdap/Td vaccine (2 - Td or Tdap) 12/08/2023   Flu Shot  03/27/2024   Medicare Annual Wellness Visit  02/26/2025   Colon Cancer Screening  01/26/2026   Pneumococcal Vaccine for age over 93  Completed   Zoster (Shingles) Vaccine  Completed   Hepatitis B Vaccine  Aged Out   HPV Vaccine  Aged Out   Meningitis B Vaccine  Aged Out    Advanced directives: (Provided) Advance directive discussed with you today. I have provided a copy for you to complete at home and have notarized. Once this is complete, please bring a copy in to our office so we can scan it into your chart.  Advance Care Planning is important because it:  [x]  Makes sure you receive the medical care that is  consistent with your values, goals, and preferences  [x]  It provides guidance to your family and loved ones and reduces their decisional burden about whether or not they are making the right decisions based on your wishes.  Follow the link provided in your after visit summary or read over the paperwork we have mailed to you to help you started getting your Advance Directives in place. If you need assistance in completing these, please reach out to us  so that we can help you!

## 2024-03-20 ENCOUNTER — Ambulatory Visit: Payer: Self-pay

## 2024-03-20 NOTE — Telephone Encounter (Signed)
 FYI Only or Action Required?: FYI only for provider.  Patient was last seen in primary care on 02/06/2024 by Plotnikov, Karlynn GAILS, MD.  Called Nurse Triage reporting Pain.  Symptoms began x couple days.  Interventions attempted: OTC medications: ibuprofen.  Symptoms are: unchanged.  Triage Disposition: See PCP When Office is Open (Within 3 Days)  Patient/caregiver understands and will follow disposition?: Yes  Copied from CRM #[id number]. Topic: Clinical - Red Word Triage >> Mar 20, 2024  5:34 PM [place] J wrote: [facility] that prompted transfer to Nurse Triage: Neck pain. ----------------------------------------------------------------------- From previous Reason for Contact - Scheduling: Patient/patient representative is calling to schedule an appointment. Refer to attachments for appointment information. Reason for Disposition  [1] MODERATE neck pain (e.g., interferes with normal activities) AND [2] present > 3 days  Answer Assessment - Initial Assessment Questions 1. ONSET: When did the pain begin?      X couple of days 2. LOCATION: Where does it hurt?      Back of neck 3. PATTERN Does the pain come and go, or has it been constant since it started?      constant 4. SEVERITY: How bad is the pain?  (Scale 0-10; or none or slight stiffness, mild, moderate, severe)     5 or  6/10 5. RADIATION: Does the pain go anywhere else, shoot into your arms?     no 6. CORD SYMPTOMS: Any weakness or numbness of the arms or legs? Numbness to fingers at night: this is ongoing 7. CAUSE: What do you think is causing the neck pain?     unknown 8. NECK OVERUSE: Any recent activities that involved turning or twisting the neck?     no 9. OTHER SYMPTOMS: Do you have any other symptoms? (e.g., headache, fever, chest pain, difficulty breathing, neck swelling)     no 10. PREGNANCY: Is there any chance you are pregnant? When was your last menstrual period?       na  Protocols  used: Neck Pain or Stiffness-A-AH

## 2024-03-23 ENCOUNTER — Ambulatory Visit (INDEPENDENT_AMBULATORY_CARE_PROVIDER_SITE_OTHER): Admitting: Family Medicine

## 2024-03-23 ENCOUNTER — Encounter: Payer: Self-pay | Admitting: Family Medicine

## 2024-03-23 ENCOUNTER — Ambulatory Visit (INDEPENDENT_AMBULATORY_CARE_PROVIDER_SITE_OTHER)

## 2024-03-23 VITALS — BP 126/80 | HR 78 | Temp 98.1°F | Resp 18 | Ht 66.0 in | Wt 167.0 lb

## 2024-03-23 DIAGNOSIS — M542 Cervicalgia: Secondary | ICD-10-CM

## 2024-03-23 DIAGNOSIS — M47811 Spondylosis without myelopathy or radiculopathy, occipito-atlanto-axial region: Secondary | ICD-10-CM | POA: Diagnosis not present

## 2024-03-23 DIAGNOSIS — M5021 Other cervical disc displacement,  high cervical region: Secondary | ICD-10-CM | POA: Diagnosis not present

## 2024-03-23 DIAGNOSIS — M47812 Spondylosis without myelopathy or radiculopathy, cervical region: Secondary | ICD-10-CM | POA: Diagnosis not present

## 2024-03-23 MED ORDER — METHOCARBAMOL 500 MG PO TABS: 500.0000 mg | ORAL_TABLET | Freq: Three times a day (TID) | ORAL | 0 refills | Status: AC | PRN

## 2024-03-23 NOTE — Progress Notes (Signed)
 Assessment & Plan:  1. Neck pain (Primary) Education provided on cervical radiculopathy.  Encouraged to continue ibuprofen since it is helpful.  Recommended Biofreeze, heating pad, Lidoderm patches, and Robaxin .  Referral placed to physical therapy. - DG Cervical Spine Complete; Future - methocarbamol  (ROBAXIN ) 500 MG tablet; Take 1 tablet (500 mg total) by mouth every 8 (eight) hours as needed.  Dispense: 30 tablet; Refill: 0 - Ambulatory referral to Physical Therapy   Follow up plan: Return if symptoms worsen or fail to improve.  Niki Rung, MSN, APRN, FNP-C  Subjective:  HPI: Jared Roth is a 73 y.o. male presenting on 03/23/2024 for Neck Pain (Muscle in neck hurt, aches for about 1 month, worse the last few days. Taking IBU and bought a new neck pillow- helps some. Some arm discomfort and numbness in left arm at night. )  Patient reports neck pain that started at least a month ago but has been worse over the last few days.  He was sleeping on 3 pillows which he feels may have contributed to the pain.  He also reports the headrest in his vehicle seems to pushed his head forward and is not comfortable; he has since removed this headrest in his car.  He bought a new neck pillow and is taking ibuprofen which is helpful.  The discomfort and numbness that he experiences in his arms at night improved after he started wearing wrist splints provided by sports medicine.   ROS: Negative unless specifically indicated above in HPI.   Relevant past medical history reviewed and updated as indicated.   Allergies and medications reviewed and updated.   Current Outpatient Medications:    amLODipine  (NORVASC ) 5 MG tablet, TAKE 1 TABLET EVERY DAY, Disp: 90 tablet, Rfl: 3   b complex vitamins tablet, Take 1 tablet by mouth daily., Disp: 100 tablet, Rfl: 3   Cholecalciferol (VITAMIN D3) 2000 units capsule, Take 1 capsule (2,000 Units total) by mouth daily., Disp: 100 capsule, Rfl: 3   rosuvastatin   (CRESTOR ) 40 MG tablet, TAKE 1 TABLET DAILY. ANNUAL APPT DUE IN DECEMBER MUST SEE PROVIDER FOR FUTURE REFILLS., Disp: 90 tablet, Rfl: 3   telmisartan  (MICARDIS ) 80 MG tablet, TAKE 1 TABLET EVERY DAY (ANNUAL APPOINTMENT DUE IN [place], MUST SEE PROVIDER FOR FURTHER REFILLS), Disp: 90 tablet, Rfl: 3   triamcinolone  cream (KENALOG ) 0.5 %, Apply 1 Application topically 4 (four) times daily., Disp: 45 g, Rfl: 1  Allergies  Allergen Reactions   Atorvastatin     REACTION: INTOL Lipitor w/ rash   Sulfonamide Derivatives     REACTION: as a child    Objective:   BP 126/80   Pulse 78   Temp 98.1 F (36.7 C)   Resp 18   Ht 5' 6 (1.676 m)   Wt 167 lb (75.8 kg)   SpO2 98%   BMI 26.95 kg/m    Physical Exam Vitals reviewed.  Constitutional:      General: He is not in acute distress.    Appearance: Normal appearance. He is not ill-appearing, toxic-appearing or diaphoretic.  HENT:     Head: Normocephalic and atraumatic.  Eyes:     General: No scleral icterus.       Right eye: No discharge.        Left eye: No discharge.     Conjunctiva/sclera: Conjunctivae normal.  Cardiovascular:     Rate and Rhythm: Normal rate.  Pulmonary:     Effort: Pulmonary effort is normal. No respiratory distress.  Musculoskeletal:     Cervical back: Tenderness (muscular bilaterally) and bony tenderness present. Decreased range of motion.  Skin:    General: Skin is warm and dry.  Neurological:     Mental Status: He is alert and oriented to person, place, and time. Mental status is at baseline.  Psychiatric:        Mood and Affect: Mood normal.        Behavior: Behavior normal.        Thought Content: Thought content normal.        Judgment: Judgment normal.

## 2024-03-23 NOTE — Patient Instructions (Signed)
 Biofreeze Heating pad Lidoderm patches Ibuprofen  Robaxin  (muscle relaxer)

## 2024-03-30 ENCOUNTER — Ambulatory Visit: Payer: Self-pay | Admitting: Internal Medicine

## 2024-03-30 DIAGNOSIS — M4802 Spinal stenosis, cervical region: Secondary | ICD-10-CM

## 2024-03-30 NOTE — Telephone Encounter (Signed)
 Called Randine  - no plan changes

## 2024-03-30 NOTE — Telephone Encounter (Unsigned)
 Copied from CRM [id number]. Topic: Clinical - Lab/Test Results >> Mar 30, 2024  9:10 AM [place] E wrote: Reason for CRM: Randine from [facility] called wanting to speak with a nurse about patient's cervical spine results. Randine would like a callback today to [phone].

## 2024-04-11 ENCOUNTER — Ambulatory Visit: Attending: Family Medicine | Admitting: Physical Therapy

## 2024-04-11 ENCOUNTER — Other Ambulatory Visit: Payer: Self-pay

## 2024-04-11 ENCOUNTER — Encounter: Payer: Self-pay | Admitting: Physical Therapy

## 2024-04-11 DIAGNOSIS — R293 Abnormal posture: Secondary | ICD-10-CM | POA: Insufficient documentation

## 2024-04-11 DIAGNOSIS — M542 Cervicalgia: Secondary | ICD-10-CM | POA: Diagnosis not present

## 2024-04-11 NOTE — Therapy (Signed)
 OUTPATIENT PHYSICAL THERAPY CERVICAL EVALUATION   Patient Name: Jared Roth MRN: [id number] DOB:1950/12/28, 73 y.o., male Today's Date: 04/11/2024  END OF SESSION:  PT End of Session - 04/11/24 0931     Visit Number 1    Number of Visits 1    PT Start Time 0900    PT Stop Time 0923    PT Time Calculation (min) 23 min    Activity Tolerance Patient tolerated treatment well    Behavior During Therapy WFL for tasks assessed/performed          Past Medical History:  Diagnosis Date   Benign prostatic hypertrophy    HSV infection    Hypercholesteremia    Hypertension    Past Surgical History:  Procedure Laterality Date   CYST REMOVAL TRUNK     ELBOW SURGERY     Patient Active Problem List   Diagnosis Date Noted   Rash 02/06/2024   Paresthesia 08/12/2023   Shoulder pain 02/20/2022   Situational depression 07/31/2021   De Quervain's tenosynovitis, right 03/06/2021   Anemia 03/22/2020   Syncope 06/23/2019   Coronary atherosclerosis 01/12/2019   Dyslipidemia 10/01/2018   Laryngospasms 10/01/2018   Stress at home 08/12/2018   Low libido 06/20/2018   Dysphagia 06/17/2018   Tinnitus 12/18/2017   Low testosterone  12/17/2015   Well adult exam 12/08/2014   BPH (benign prostatic hyperplasia) 06/08/2014   Elevated PSA 12/07/2013   Male hypogonadism 06/13/2012   Mild aortic insufficiency 03/12/2012   Abnormal ECG 02/05/2012   Murmur 02/05/2012   Palpitations 02/05/2012   Essential hypertension 01/05/2009    PCP: Plotnikov, Karlynn GAILS, MD  REFERRING PROVIDER: Merlynn Niki FALCON, FNP  REFERRING DIAG: Neck pain [M54.2]   Rationale for Evaluation and Treatment: Rehabilitation  THERAPY DIAG:  Abnormal posture  PERTINENT HISTORY: HTN, Anemia, Aortic insuffiency, laryngospasms.  WEIGHT BEARING RESTRICTIONS: No  FALLS:  Has patient fallen in last 6 months? No  LIVING ENVIRONMENT: Lives with: lives with their family and lives with an adult companion Lives in:  House/apartment Stairs: n/a Has following equipment at home: None  OCCUPATION: real estate agent   PRECAUTIONS: None ---------------------------------------------------------------------------------------------  SUBJECTIVE:                                                                                                                                                                                                         SUBJECTIVE STATEMENT: Eval statement 04/11/2024: started noticing neck pain on and off for the past few months, hurt the most when he went to the doctor last. Pain at  the time was a severe ache. Driving aggravated it due to the head rest. Since has removed the head rest and his pain has improved greatly alongside a heating pad and ibuprofen. Currently 0/10 pain, used to have a numbness into the B fingers, has resolved.no swelling. Hand dominance: Right  RED FLAGS: None   PLOF: Independent  PATIENT GOALS: see if I need PT  NEXT MD VISIT: need to schedule. ---------------------------------------------------------------------------------------------  OBJECTIVE:  Note: Objective measures were completed at Evaluation unless otherwise noted.  DIAGNOSTIC FINDINGS:  IMPRESSION: 1. Multilevel degenerative changes of the cervical spine with severe RIGHT-sided osseous neuroforaminal narrowing at C3-4 and C4-5. Limited assessment of the LEFT-sided osseous neuroforamen. 2. There is a mildly prominent cortical concavity along the LEFT lateral aspect of the dens, suboptimally assessed given overlapping tissues at this level. This is favored to be degenerative/developmental in etiology, but fracture does remain in the differential. If there is clinical concern for fracture, recommend further evaluation with CT.   These results will be called to the ordering clinician or representative by the Radiologist Assistant, and communication documented in the PACS or [employer].     Electronically Signed   By: Corean Salter M.D.   On: 03/28/2024 09:45    PATIENT SURVEYS:  NDI: 1/50  COGNITION: Overall cognitive status: Within functional limits for tasks assessed  SENSATION: WFL  POSTURE: rounded shoulders and forward head  PALPATION: No TTP   CERVICAL ROM:   Active ROM A/PROM (deg) eval  Flexion WFL  Extension WFL  Right lateral flexion WFL  Left lateral flexion WFL  Right rotation WFL  Left rotation WFL   (Blank rows = not tested)  ! Indicates pain with testing  UPPER EXTREMITY ROM:  Active ROM Right eval Left eval  Shoulder flexion [facility]  Shoulder extension [facility]  Shoulder abduction [facility]  Shoulder adduction    Shoulder extension    Shoulder internal rotation    Shoulder external rotation    Elbow flexion    Elbow extension    Wrist flexion    Wrist extension    Wrist ulnar deviation    Wrist radial deviation    Wrist pronation    Wrist supination     (Blank rows = not tested) ! Indicates pain with testing  UPPER EXTREMITY MMT:  MMT Right eval Left eval  Shoulder flexion 5 5  Shoulder extension 5 5  Shoulder abduction 5 5  Shoulder adduction    Shoulder extension    Shoulder internal rotation    Shoulder external rotation    Middle trapezius    Lower trapezius    Elbow flexion    Elbow extension    Wrist flexion    Wrist extension    Wrist ulnar deviation    Wrist radial deviation    Wrist pronation    Wrist supination    Grip strength     (Blank rows = not tested)  ! Indicates pain with testing   CERVICAL SPECIAL TESTS:  Neck flexor muscle endurance test: Positive, Spurling's test: Negative, and Distraction test: Negative   [facility] Adult PT Treatment:                                                DATE: 04/11/2024 Self Care: POC discussion Pt education  PATIENT EDUCATION:  Education details: Pt received education regarding HEP performance, ADL performance, functional activity tolerance, impairment education, appropriate performance of therapeutic activities. Person educated: Patient Education method: Explanation, Demonstration, Tactile cues, Verbal cues, and Handouts Education comprehension: verbalized understanding and returned demonstration  HOME EXERCISE PROGRAM: Access Code: [facility] URL: [email or web address] Date: 04/11/2024 Prepared by: Mabel Kiang  Exercises - Seated Cervical Retraction  - 1 x daily - 5 x weekly - 3 sets - 10 reps - Supine Deep Neck Flexor Training - Repetitions  - 1 x daily - 5 x weekly - 2-3 sets - 15 reps - 3s hold - Seated Upper Trapezius Stretch  - 1 x daily - 7 x weekly - 2 sets - 1 reps - 48m hold - Standing Shoulder Row with Anchored Resistance  - 1 x daily - 4 x weekly - 2-3 sets - 12 reps - 3s hold - Shoulder extension with resistance - Neutral  - 1 x daily - 4 x weekly - 2-3 sets - 12 reps - 3s hold ---------------------------------------------------------------------------------------------  ASSESSMENT:  CLINICAL IMPRESSION:  Eval impression (04/11/2024): Pt. attended today's physical therapy session for evaluation of neck pain. Pt attends with no current symptoms or pain, however mentioned driving causing serious neck pain until he removed his head rest. Pts primary deficit is related to posture which based on the subjective interview likely led to cervical instability inciting the original pain.  While pt is not currently appropriate for OPPT.  Pt would benefit from long term HEP to manage posture and prevent future episodes of instability inducing pain. Treatment performed today focused on pt education detailed in the objective. Pt demonstrated great understanding of education provided. required minimal v/t cues and no assistance for appropriate performance with today's  activities.   OBJECTIVE IMPAIRMENTS: postural dysfunction.   ACTIVITY LIMITATIONS: reports no limitations at eval  PARTICIPATION LIMITATIONS: reports no limitations at eval  PERSONAL FACTORS: Fitness are also affecting patient's functional outcome.   REHAB POTENTIAL: Excellent  CLINICAL DECISION MAKING: Stable/uncomplicated  EVALUATION COMPLEXITY: Low   GOALS: Goals reviewed with patient? Yes  SHORT TERM GOALS: Target date: 04/11/2024   Pt will be independent with administered HEP to demonstrate the competency necessary for long term managemnet of symptoms at home.  Baseline:  Goal status: MET 04/11/2024   PLAN:  PT FREQUENCY: one visit  PT DURATION: one visit  PLANNED INTERVENTIONS: 97110-Therapeutic exercises, 97530- Therapeutic activity, 97112- Neuromuscular re-education, 97535- Self Care, 02859- Manual therapy, Patient/Family education, Stair training, Taping, Joint mobilization, and Spinal mobilization  PLAN FOR NEXT SESSION: review HEP, Begin POC as detailed In assessment.   Mabel JONELLE Kiang, PT 04/11/2024, 9:32 AM  Date of referral: 03/23/2024 Referring provider: Merlynn Niki FALCON, FNP (03/23/2024)  Referring diagnosis? Neck pain Treatment diagnosis? (if different than referring diagnosis) postural dysfunction  What was this (referring dx) caused by? Ongoing Issue  Lysle of Condition: Initial Onset (within last 3 months)   Laterality: Both  Current Functional Measure Score: Neck Index 1/50  Objective measurements identify impairments when they are compared to normal values, the uninvolved extremity, and prior level of function.  []  Yes  []  No  Objective assessment of functional ability: No functional limitations   Briefly describe symptoms: had neck pain a few weeks ago, has resolved,  How did symptoms start: no moi, likely posture  Average pain intensity:  Last 24 hours: 0/10  Past week: 0/10  How often does the pt experience symptoms?  Intermittently  How much have the symptoms  interfered with usual daily activities? A little bit  How has condition changed since care began at this facility? NA - initial visit  In general, how is the patients overall health? Good   BACK PAIN (STarT Back Screening Tool) No

## 2024-04-13 ENCOUNTER — Other Ambulatory Visit: Payer: Self-pay | Admitting: Internal Medicine

## 2024-04-14 ENCOUNTER — Telehealth: Payer: Self-pay

## 2024-04-14 NOTE — Telephone Encounter (Unsigned)
 Copied from CRM [id number]. Topic: Clinical - Prescription Issue >> Apr 13, 2024  5:20 PM Dedra B wrote: Reason for CRM: Pt received a text saying Dr. Garald denied his medication refills for amlodipine , rosuvastatin ,and telmisartan . He wants to know why they were denied. Please give patient a call to discuss.

## 2024-04-15 MED ORDER — ROSUVASTATIN CALCIUM 40 MG PO TABS: 40.0000 mg | ORAL_TABLET | Freq: Every day | ORAL | 3 refills | Status: AC

## 2024-04-15 MED ORDER — AMLODIPINE BESYLATE 5 MG PO TABS: 5.0000 mg | ORAL_TABLET | Freq: Every day | ORAL | 3 refills | Status: AC

## 2024-04-15 MED ORDER — TELMISARTAN 80 MG PO TABS: 80.0000 mg | ORAL_TABLET | Freq: Every day | ORAL | 3 refills | Status: AC

## 2024-04-15 NOTE — Telephone Encounter (Signed)
 This request never came to me.  I approve the refills.  Thank you

## 2024-04-20 DIAGNOSIS — S99921A Unspecified injury of right foot, initial encounter: Secondary | ICD-10-CM | POA: Diagnosis not present

## 2024-04-20 DIAGNOSIS — R030 Elevated blood-pressure reading, without diagnosis of hypertension: Secondary | ICD-10-CM | POA: Diagnosis not present

## 2024-04-20 DIAGNOSIS — S91112A Laceration without foreign body of left great toe without damage to nail, initial encounter: Secondary | ICD-10-CM | POA: Diagnosis not present

## 2024-04-20 DIAGNOSIS — I10 Essential (primary) hypertension: Secondary | ICD-10-CM | POA: Diagnosis not present

## 2024-04-20 DIAGNOSIS — S92425B Nondisplaced fracture of distal phalanx of left great toe, initial encounter for open fracture: Secondary | ICD-10-CM | POA: Diagnosis not present

## 2024-04-20 DIAGNOSIS — S92422A Displaced fracture of distal phalanx of left great toe, initial encounter for closed fracture: Secondary | ICD-10-CM | POA: Diagnosis not present

## 2024-04-28 DIAGNOSIS — S92425A Nondisplaced fracture of distal phalanx of left great toe, initial encounter for closed fracture: Secondary | ICD-10-CM | POA: Diagnosis not present

## 2024-04-28 DIAGNOSIS — S91112A Laceration without foreign body of left great toe without damage to nail, initial encounter: Secondary | ICD-10-CM | POA: Diagnosis not present

## 2024-06-09 DIAGNOSIS — S91112A Laceration without foreign body of left great toe without damage to nail, initial encounter: Secondary | ICD-10-CM | POA: Diagnosis not present

## 2024-06-09 DIAGNOSIS — S92425D Nondisplaced fracture of distal phalanx of left great toe, subsequent encounter for fracture with routine healing: Secondary | ICD-10-CM | POA: Diagnosis not present

## 2024-06-09 DIAGNOSIS — S92425A Nondisplaced fracture of distal phalanx of left great toe, initial encounter for closed fracture: Secondary | ICD-10-CM | POA: Diagnosis not present

## 2024-06-09 DIAGNOSIS — S92112D Displaced fracture of neck of left talus, subsequent encounter for fracture with routine healing: Secondary | ICD-10-CM | POA: Diagnosis not present

## 2024-08-21 ENCOUNTER — Ambulatory Visit (INDEPENDENT_AMBULATORY_CARE_PROVIDER_SITE_OTHER): Admitting: Internal Medicine

## 2024-08-21 ENCOUNTER — Telehealth: Payer: Self-pay

## 2024-08-21 ENCOUNTER — Encounter: Payer: Self-pay | Admitting: Internal Medicine

## 2024-08-21 VITALS — BP 140/82 | HR 57 | Temp 97.9°F | Ht 66.0 in | Wt 170.8 lb

## 2024-08-21 DIAGNOSIS — R972 Elevated prostate specific antigen [PSA]: Secondary | ICD-10-CM | POA: Diagnosis not present

## 2024-08-21 DIAGNOSIS — I10 Essential (primary) hypertension: Secondary | ICD-10-CM | POA: Diagnosis not present

## 2024-08-21 DIAGNOSIS — Z0000 Encounter for general adult medical examination without abnormal findings: Secondary | ICD-10-CM | POA: Diagnosis not present

## 2024-08-21 DIAGNOSIS — Z1211 Encounter for screening for malignant neoplasm of colon: Secondary | ICD-10-CM

## 2024-08-21 NOTE — Assessment & Plan Note (Signed)
 Cont on Micardis, Norvasc BP ok at home

## 2024-08-21 NOTE — Progress Notes (Signed)
 "  Subjective:  Patient ID: Jared Roth, male    DOB: 10/03/1950  Age: 73 y.o. MRN: [id number]  CC: Annual Exam (Annual Exam)   HPI [name] presents for a well exam F/u on HTN. C/o chest muscle cramps at times....  Outpatient Medications Prior to Visit  Medication Sig Dispense Refill   amLODipine  (NORVASC ) 5 MG tablet Take 1 tablet (5 mg total) by mouth daily. 90 tablet 3   b complex vitamins tablet Take 1 tablet by mouth daily. 100 tablet 3   Cholecalciferol (VITAMIN D3) 2000 units capsule Take 1 capsule (2,000 Units total) by mouth daily. 100 capsule 3   methocarbamol  (ROBAXIN ) 500 MG tablet Take 1 tablet (500 mg total) by mouth every 8 (eight) hours as needed. 30 tablet 0   rosuvastatin  (CRESTOR ) 40 MG tablet Take 1 tablet (40 mg total) by mouth daily. 90 tablet 3   telmisartan  (MICARDIS ) 80 MG tablet Take 1 tablet (80 mg total) by mouth daily. 90 tablet 3   triamcinolone  cream (KENALOG ) 0.5 % Apply 1 Application topically 4 (four) times daily. 45 g 1   No facility-administered medications prior to visit.    ROS: Review of Systems  Constitutional:  Negative for appetite change, fatigue and unexpected weight change.  HENT:  Negative for congestion, nosebleeds, sneezing, sore throat and trouble swallowing.   Eyes:  Negative for itching and visual disturbance.  Respiratory:  Negative for cough.   Cardiovascular:  Negative for chest pain, palpitations and leg swelling.  Gastrointestinal:  Negative for abdominal distention, blood in stool, diarrhea and nausea.  Genitourinary:  Negative for frequency and hematuria.  Musculoskeletal:  Negative for back pain, gait problem, joint swelling and neck pain.  Skin:  Negative for rash.  Neurological:  Negative for dizziness, tremors, speech difficulty and weakness.  Psychiatric/Behavioral:  Negative for agitation, dysphoric mood, sleep disturbance and suicidal ideas. The patient is not nervous/anxious.     Objective:  BP (!) 140/82  (Cuff Size: Normal)   Pulse (!) 57   Temp 97.9 F (36.6 C)   Ht 5' 6 (1.676 m)   Wt 170 lb 12.8 oz (77.5 kg)   SpO2 99%   BMI 27.57 kg/m   BP Readings from Last 3 Encounters:  08/21/24 (!) 140/82  03/23/24 126/80  02/06/24 138/84    Wt Readings from Last 3 Encounters:  08/21/24 170 lb 12.8 oz (77.5 kg)  03/23/24 167 lb (75.8 kg)  02/27/24 168 lb (76.2 kg)    Physical Exam Constitutional:      General: He is not in acute distress.    Appearance: He is well-developed.     Comments: NAD  Eyes:     Conjunctiva/sclera: Conjunctivae normal.     Pupils: Pupils are equal, round, and reactive to light.  Neck:     Thyroid : No thyromegaly.     Vascular: No JVD.  Cardiovascular:     Rate and Rhythm: Normal rate and regular rhythm.     Heart sounds: Normal heart sounds. No murmur heard.    No friction rub. No gallop.  Pulmonary:     Effort: Pulmonary effort is normal. No respiratory distress.     Breath sounds: Normal breath sounds. No wheezing or rales.  Chest:     Chest wall: No tenderness.  Abdominal:     General: Bowel sounds are normal. There is no distension.     Palpations: Abdomen is soft. There is no mass.     Tenderness: There  is no abdominal tenderness. There is no guarding or rebound.  Genitourinary:    Prostate: Normal.     Rectum: Normal. Guaiac result negative.  Musculoskeletal:        General: No tenderness. Normal range of motion.     Cervical back: Normal range of motion.  Lymphadenopathy:     Cervical: No cervical adenopathy.  Skin:    General: Skin is warm and dry.     Findings: No rash.  Neurological:     Mental Status: He is alert and oriented to person, place, and time.     Cranial Nerves: No cranial nerve deficit.     Motor: No abnormal muscle tone.     Coordination: Coordination normal.     Gait: Gait normal.     Deep Tendon Reflexes: Reflexes are normal and symmetric.  Psychiatric:        Behavior: Behavior normal.        Thought  Content: Thought content normal.        Judgment: Judgment normal.     Lab Results  Component Value Date   WBC 4.5 08/12/2023   HGB 14.0 08/12/2023   HCT 41.8 08/12/2023   PLT 254.0 08/12/2023   GLUCOSE 93 08/12/2023   CHOL 139 08/12/2023   TRIG 63.0 08/12/2023   HDL 57.20 08/12/2023   LDLDIRECT 172.5 09/02/2006   LDLCALC 69 08/12/2023   ALT 31 08/12/2023   AST 29 08/12/2023   NA 140 08/12/2023   K 4.7 08/12/2023   CL 102 08/12/2023   CREATININE 0.95 08/12/2023   BUN 12 08/12/2023   CO2 30 08/12/2023   TSH 1.03 08/12/2023   PSA 2.18 08/12/2023    CT CORONARY MORPH W/CTA COR W/SCORE W/CA W/CM &/OR WO/CM Addendum Date: 10/05/2019 ADDENDUM REPORT: 10/05/2019 18:59 CLINICAL DATA:  Chest pain/syncope EXAM: Cardiac/Coronary CTA TECHNIQUE: The patient was scanned on a [employer]. A 100 kV prospective scan was triggered in the descending thoracic aorta at 111 HU's. Axial non-contrast 3 mm slices were carried out through the heart. The data set was analyzed on a dedicated work station and scored using the Agatson method. Gantry rotation speed was 250 msecs and collimation was .6 mm. No beta blockade and 0.8 mg of sl NTG was given. The 3D data set was reconstructed in 5% intervals of the 35-75 % of the R-R cycle. Diastolic phases were analyzed on a dedicated work station using MPR, MIP and VRT modes. The patient received 80 cc of contrast. FINDINGS: Image quality: excellent. Noise artifact is: Limited. Coronary Arteries:  Normal coronary origin.  Right dominance. Left main: The left main is a large caliber vessel with a normal take off from the left coronary cusp that trifurcates into a LAD, LCX, and ramus intermedius. There is no plaque or stenosis. Left anterior descending artery: The proximal LAD is patent without plaque or stenosis. The minimal LAD contains mild non-calcified plaque (<25%). The distal LAD contains mild calcified plaque (25-49%). The first diagonal contains mild  calcified plaque (25-49%). Ramus intermedius: The ramus intermedius contains mild mixed density plaque (25-49%). Left circumflex artery: The LCX is non-dominant. The proximal LCX contains minimal calcified plaque (<25%). There is mild non-calcified plaque (25-49%) in the distal LCX. The LCX gives off 2 patent obtuse marginal branches. Right coronary artery: The RCA is dominant with normal take off from the right coronary cusp. The proximal RCA is patent. The mid RCA contains a mild non-calcified plaque (25-49%). There is artifact in this region  due to an acute marginal branch, but this lesion appears mild. The distal RCA is patent. The RCA terminates as a PDA and right posterolateral branch without evidence of plaque or stenosis. Right Atrium: Right atrial size is within normal limits. Right Ventricle: The right ventricular cavity is within normal limits. Left Atrium: Left atrial size is normal in size with no left atrial appendage filling defect. A small PFO is noted. Left Ventricle: The ventricular cavity size is within normal limits. There are no stigmata of prior infarction. There is no abnormal filling defect. Pulmonary arteries: Normal in size without proximal filling defect. Pulmonary veins: Normal pulmonary venous drainage. Pericardium: Normal thickness with no significant effusion or calcium  present. Cardiac valves: The aortic valve is trileaflet without significant calcification. The mitral valve is normal structure without significant calcification. Aorta: Normal caliber with no significant disease. Extra-cardiac findings: See attached radiology report for non-cardiac structures. IMPRESSION: 1. Coronary calcium  score of 172. This was 80th percentile for age and sex matched control. 2. Normal coronary origin with right dominance. 3. 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 4. Small PFO noted. RECOMMENDATIONS: 1. Mild non-obstructive CAD (25-49%). Consider non-atherosclerotic causes of chest  pain. Consider preventive therapy and risk factor modification. Darryle Decent, MD Electronically Signed   By: Darryle Decent   On: 10/05/2019 18:59   Result Date: 10/05/2019 EXAM: OVER-READ INTERPRETATION  CT CHEST The following report is an over-read performed by radiologist Dr. Marcey Moan of [facility] on 10/05/2019. This over-read does not include interpretation of cardiac or coronary anatomy or pathology. The coronary CTA interpretation by the cardiologist is attached. COMPARISON:  Coronary calcium  study on 07/04/2018 FINDINGS: Vascular: No incidental findings. Mediastinum/Nodes: Visualized mediastinum and hilar regions demonstrate no lymphadenopathy or focal masses. Lungs/Pleura: There is no evidence of pulmonary edema, consolidation, pneumothorax, nodule or pleural fluid. Upper Abdomen: No acute abnormality. Musculoskeletal: No chest wall mass or suspicious bone lesions identified. IMPRESSION: No incidental findings. Electronically Signed: By: Marcey Moan M.D. On: 10/05/2019 11:38    Assessment & Plan:   Problem List Items Addressed This Visit     Elevated PSA   Monitor PSA      Essential hypertension   Cont on Micardis , Norvasc  BP ok at home      Well adult exam - Primary   We discussed age appropriate health related issues, including available/recomended screening tests and vaccinations. Labs were ordered to be later reviewed . All questions were answered. We discussed one or more of the following - seat belt use, use of sunscreen/sun exposure exercise, safe sex, fall risk reduction, second hand smoke exposure, firearm use and storage, seat belt use, a need for adhering to healthy diet and exercise. Labs were ordered.  All questions were answered. Colon due in 2027 (last 2017) Dr Romilda CT Ca score 11/19 - Coronary arteries: Mild calcium  noted in all 3 major epicardial coronary arteries. Coronary calcium  score of 81.  We ordered a Cologuard test      Relevant  Orders   TSH   Urinalysis   CBC with Differential/Platelet   Lipid panel   PSA   Comprehensive metabolic panel with GFR   Cologuard   Other Visit Diagnoses       Screening for colon cancer       Relevant Orders   Cologuard         No orders of the defined types were placed in this encounter.     Follow-up: Return in about 6 months (  around 02/19/2025) for a follow-up visit.  Marolyn Noel, MD "

## 2024-08-21 NOTE — Telephone Encounter (Signed)
 Copied from CRM #[id number]. Topic: Clinical - Request for Lab/Test Order >> Aug 21, 2024  8:49 AM Berwyn MATSU wrote: Reason for CRM: Patient called in requesting to have blood sugar levels checked.   I called CAL who advised that patient would need to have appointment and labs ordered.   Patient declined appt as he was just seen today and is requesting MD to place order for blood sugar testing. Per patient states diabetes runs in his family and would like to be checked.   May you please advise.

## 2024-08-21 NOTE — Telephone Encounter (Signed)
 Spoke With the pt and was able to inform him of the A1c lab being added on to his lab orders. I was able to schedule the pt a lab apptmnt for 08/24/2024.

## 2024-08-21 NOTE — Assessment & Plan Note (Signed)
 Monitor PSA

## 2024-08-21 NOTE — Telephone Encounter (Signed)
 I was able to add this lab order in pts chart. Lab will be notified as well.

## 2024-08-21 NOTE — Addendum Note (Signed)
 Addended by: HEDDY IP R on: 08/21/2024 09:48 AM   Modules accepted: Orders

## 2024-08-21 NOTE — Assessment & Plan Note (Addendum)
 We discussed age appropriate health related issues, including available/recomended screening tests and vaccinations. Labs were ordered to be later reviewed . All questions were answered. We discussed one or more of the following - seat belt use, use of sunscreen/sun exposure exercise, safe sex, fall risk reduction, second hand smoke exposure, firearm use and storage, seat belt use, a need for adhering to healthy diet and exercise. Labs were ordered.  All questions were answered. Colon due in 2027 (last 2017) Dr Romilda CT Ca score 11/19 - Coronary arteries: Mild calcium  noted in all 3 major epicardial coronary arteries. Coronary calcium  score of 81.  We ordered a Cologuard test

## 2024-08-24 ENCOUNTER — Encounter: Admitting: Internal Medicine

## 2024-08-24 ENCOUNTER — Other Ambulatory Visit

## 2024-08-24 DIAGNOSIS — Z0000 Encounter for general adult medical examination without abnormal findings: Secondary | ICD-10-CM

## 2024-08-24 LAB — CBC WITH DIFFERENTIAL/PLATELET
Basophils Absolute: 0 K/uL (ref 0.0–0.1)
Basophils Relative: 0.5 % (ref 0.0–3.0)
Eosinophils Absolute: 0.1 K/uL (ref 0.0–0.7)
Eosinophils Relative: 1.7 % (ref 0.0–5.0)
HCT: 38.6 % — ABNORMAL LOW (ref 39.0–52.0)
Hemoglobin: 12.9 g/dL — ABNORMAL LOW (ref 13.0–17.0)
Lymphocytes Relative: 43 % (ref 12.0–46.0)
Lymphs Abs: 1.7 K/uL (ref 0.7–4.0)
MCHC: 33.4 g/dL (ref 30.0–36.0)
MCV: 93.5 fl (ref 78.0–100.0)
Monocytes Absolute: 0.5 K/uL (ref 0.1–1.0)
Monocytes Relative: 13.2 % — ABNORMAL HIGH (ref 3.0–12.0)
Neutro Abs: 1.6 K/uL (ref 1.4–7.7)
Neutrophils Relative %: 41.6 % — ABNORMAL LOW (ref 43.0–77.0)
Platelets: 233 K/uL (ref 150.0–400.0)
RBC: 4.13 Mil/uL — ABNORMAL LOW (ref 4.22–5.81)
RDW: 13.5 % (ref 11.5–15.5)
WBC: 3.9 K/uL — ABNORMAL LOW (ref 4.0–10.5)

## 2024-08-24 LAB — URINALYSIS
Bilirubin Urine: NEGATIVE
Hgb urine dipstick: NEGATIVE
Ketones, ur: NEGATIVE
Leukocytes,Ua: NEGATIVE
Nitrite: NEGATIVE
Specific Gravity, Urine: 1.015 (ref 1.000–1.030)
Total Protein, Urine: NEGATIVE
Urine Glucose: NEGATIVE
Urobilinogen, UA: 0.2 (ref 0.0–1.0)
pH: 7 (ref 5.0–8.0)

## 2024-08-24 LAB — HEMOGLOBIN A1C: Hgb A1c MFr Bld: 5.9 % (ref 4.6–6.5)

## 2024-08-24 LAB — COMPREHENSIVE METABOLIC PANEL WITH GFR
ALT: 27 U/L (ref 3–53)
AST: 25 U/L (ref 5–37)
Albumin: 4.2 g/dL (ref 3.5–5.2)
Alkaline Phosphatase: 68 U/L (ref 39–117)
BUN: 13 mg/dL (ref 6–23)
CO2: 30 meq/L (ref 19–32)
Calcium: 9.1 mg/dL (ref 8.4–10.5)
Chloride: 101 meq/L (ref 96–112)
Creatinine, Ser: 0.95 mg/dL (ref 0.40–1.50)
GFR: 80.7 mL/min
Glucose, Bld: 98 mg/dL (ref 70–99)
Potassium: 3.9 meq/L (ref 3.5–5.1)
Sodium: 138 meq/L (ref 135–145)
Total Bilirubin: 0.5 mg/dL (ref 0.2–1.2)
Total Protein: 7.2 g/dL (ref 6.0–8.3)

## 2024-08-24 LAB — TSH: TSH: 1.54 u[IU]/mL (ref 0.35–5.50)

## 2024-08-24 LAB — LIPID PANEL
Cholesterol: 119 mg/dL (ref 28–200)
HDL: 51.3 mg/dL
LDL Cholesterol: 58 mg/dL (ref 10–99)
NonHDL: 67.85
Total CHOL/HDL Ratio: 2
Triglycerides: 51 mg/dL (ref 10.0–149.0)
VLDL: 10.2 mg/dL (ref 0.0–40.0)

## 2024-08-24 LAB — PSA: PSA: 2.19 ng/mL (ref 0.10–4.00)

## 2024-08-28 ENCOUNTER — Ambulatory Visit: Payer: Self-pay | Admitting: Internal Medicine

## 2024-08-28 DIAGNOSIS — D649 Anemia, unspecified: Secondary | ICD-10-CM

## 2024-08-28 NOTE — Telephone Encounter (Signed)
 Copied from CRM #[id number]. Topic: Clinical - Lab/Test Results >> Aug 26, 2024  3:56 PM Alfonso ORN wrote: Reason for CRM: pt wanting to know if any suggestions regarding blood test results to get findings in a normal range. Please call to advise

## 2024-09-04 LAB — COLOGUARD: COLOGUARD: NEGATIVE

## 2024-09-23 ENCOUNTER — Encounter: Admitting: Internal Medicine

## 2024-10-02 ENCOUNTER — Encounter: Payer: Self-pay | Admitting: Internal Medicine

## 2024-10-02 ENCOUNTER — Ambulatory Visit: Admitting: Internal Medicine

## 2024-10-02 VITALS — BP 150/82 | HR 64 | Ht 66.0 in | Wt 168.8 lb

## 2024-10-02 DIAGNOSIS — R238 Other skin changes: Secondary | ICD-10-CM | POA: Insufficient documentation

## 2024-10-02 DIAGNOSIS — D649 Anemia, unspecified: Secondary | ICD-10-CM

## 2024-10-02 LAB — CBC WITH DIFFERENTIAL/PLATELET
Basophils Absolute: 0 10*3/uL (ref 0.0–0.1)
Basophils Relative: 0.9 % (ref 0.0–3.0)
Eosinophils Absolute: 0 10*3/uL (ref 0.0–0.7)
Eosinophils Relative: 1.3 % (ref 0.0–5.0)
HCT: 39.4 % (ref 39.0–52.0)
Hemoglobin: 13.1 g/dL (ref 13.0–17.0)
Lymphocytes Relative: 37.4 % (ref 12.0–46.0)
Lymphs Abs: 1.2 10*3/uL (ref 0.7–4.0)
MCHC: 33.4 g/dL (ref 30.0–36.0)
MCV: 92.7 fl (ref 78.0–100.0)
Monocytes Absolute: 0.4 10*3/uL (ref 0.1–1.0)
Monocytes Relative: 11.9 % (ref 3.0–12.0)
Neutro Abs: 1.6 10*3/uL (ref 1.4–7.7)
Neutrophils Relative %: 48.5 % (ref 43.0–77.0)
Platelets: 257 10*3/uL (ref 150.0–400.0)
RBC: 4.25 Mil/uL (ref 4.22–5.81)
RDW: 13.1 % (ref 11.5–15.5)
WBC: 3.3 10*3/uL — ABNORMAL LOW (ref 4.0–10.5)

## 2024-10-02 MED ORDER — DOXYCYCLINE HYCLATE 100 MG PO TABS: 100.0000 mg | ORAL_TABLET | Freq: Two times a day (BID) | ORAL | 0 refills | Status: AC

## 2024-10-02 NOTE — Assessment & Plan Note (Signed)
 Recurrent mild anemia  w/decr WBC Repeat CBC, iron Cologuard (-)

## 2024-10-02 NOTE — Progress Notes (Unsigned)
 "  Subjective:  Patient ID: SCHYLAR WUEBKER, male    DOB: 1951-02-27  Age: 74 y.o. MRN: [id number]  CC: Head Injury (Head injury over the past month (left and right). New possible cyst on left side of forehead, tender to the touch. No drainage present)   HPI Loraine BILAAL LEIB presents for a pimple on forehead   F/u on anemia and decreased WBC   Outpatient Medications Prior to Visit  Medication Sig Dispense Refill   amLODipine  (NORVASC ) 5 MG tablet Take 1 tablet (5 mg total) by mouth daily. 90 tablet 3   b complex vitamins tablet Take 1 tablet by mouth daily. 100 tablet 3   Cholecalciferol (VITAMIN D3) 2000 units capsule Take 1 capsule (2,000 Units total) by mouth daily. 100 capsule 3   methocarbamol  (ROBAXIN ) 500 MG tablet Take 1 tablet (500 mg total) by mouth every 8 (eight) hours as needed. 30 tablet 0   rosuvastatin  (CRESTOR ) 40 MG tablet Take 1 tablet (40 mg total) by mouth daily. 90 tablet 3   telmisartan  (MICARDIS ) 80 MG tablet Take 1 tablet (80 mg total) by mouth daily. 90 tablet 3   triamcinolone  cream (KENALOG ) 0.5 % Apply 1 Application topically 4 (four) times daily. 45 g 1   No facility-administered medications prior to visit.    ROS: Review of Systems  Objective:  BP (!) 150/82   Pulse 64   Ht 5' 6 (1.676 m)   Wt 168 lb 12.8 oz (76.6 kg)   SpO2 99%   BMI 27.25 kg/m   BP Readings from Last 3 Encounters:  10/02/24 (!) 150/82  08/21/24 (!) 140/82  03/23/24 126/80    Wt Readings from Last 3 Encounters:  10/02/24 168 lb 12.8 oz (76.6 kg)  08/21/24 170 lb 12.8 oz (77.5 kg)  03/23/24 167 lb (75.8 kg)    Physical Exam  Resolving <1cm pimple on forehead  Lab Results  Component Value Date   WBC 3.9 (L) 08/24/2024   HGB 12.9 (L) 08/24/2024   HCT 38.6 (L) 08/24/2024   PLT 233.0 08/24/2024   GLUCOSE 98 08/24/2024   CHOL 119 08/24/2024   TRIG 51.0 08/24/2024   HDL 51.30 08/24/2024   LDLDIRECT 172.5 09/02/2006   LDLCALC 58 08/24/2024   ALT 27 08/24/2024    AST 25 08/24/2024   NA 138 08/24/2024   K 3.9 08/24/2024   CL 101 08/24/2024   CREATININE 0.95 08/24/2024   BUN 13 08/24/2024   CO2 30 08/24/2024   TSH 1.54 08/24/2024   PSA 2.19 08/24/2024   HGBA1C 5.9 08/24/2024    CT CORONARY MORPH W/CTA COR W/SCORE W/CA W/CM &/OR WO/CM Addendum Date: 10/05/2019 ADDENDUM REPORT: 10/05/2019 18:59 CLINICAL DATA:  Chest pain/syncope EXAM: Cardiac/Coronary CTA TECHNIQUE: The patient was scanned on a [employer]. A 100 kV prospective scan was triggered in the descending thoracic aorta at 111 HU's. Axial non-contrast 3 mm slices were carried out through the heart. The data set was analyzed on a dedicated work station and scored using the Agatson method. Gantry rotation speed was 250 msecs and collimation was .6 mm. No beta blockade and 0.8 mg of sl NTG was given. The 3D data set was reconstructed in 5% intervals of the 35-75 % of the R-R cycle. Diastolic phases were analyzed on a dedicated work station using MPR, MIP and VRT modes. The patient received 80 cc of contrast. FINDINGS: Image quality: excellent. Noise artifact is: Limited. Coronary Arteries:  Normal coronary origin.  Right dominance.  Left main: The left main is a large caliber vessel with a normal take off from the left coronary cusp that trifurcates into a LAD, LCX, and ramus intermedius. There is no plaque or stenosis. Left anterior descending artery: The proximal LAD is patent without plaque or stenosis. The minimal LAD contains mild non-calcified plaque (<25%). The distal LAD contains mild calcified plaque (25-49%). The first diagonal contains mild calcified plaque (25-49%). Ramus intermedius: The ramus intermedius contains mild mixed density plaque (25-49%). Left circumflex artery: The LCX is non-dominant. The proximal LCX contains minimal calcified plaque (<25%). There is mild non-calcified plaque (25-49%) in the distal LCX. The LCX gives off 2 patent obtuse marginal branches. Right coronary  artery: The RCA is dominant with normal take off from the right coronary cusp. The proximal RCA is patent. The mid RCA contains a mild non-calcified plaque (25-49%). There is artifact in this region due to an acute marginal branch, but this lesion appears mild. The distal RCA is patent. The RCA terminates as a PDA and right posterolateral branch without evidence of plaque or stenosis. Right Atrium: Right atrial size is within normal limits. Right Ventricle: The right ventricular cavity is within normal limits. Left Atrium: Left atrial size is normal in size with no left atrial appendage filling defect. A small PFO is noted. Left Ventricle: The ventricular cavity size is within normal limits. There are no stigmata of prior infarction. There is no abnormal filling defect. Pulmonary arteries: Normal in size without proximal filling defect. Pulmonary veins: Normal pulmonary venous drainage. Pericardium: Normal thickness with no significant effusion or calcium  present. Cardiac valves: The aortic valve is trileaflet without significant calcification. The mitral valve is normal structure without significant calcification. Aorta: Normal caliber with no significant disease. Extra-cardiac findings: See attached radiology report for non-cardiac structures. IMPRESSION: 1. Coronary calcium  score of 172. This was 80th percentile for age and sex matched control. 2. Normal coronary origin with right dominance. 3. 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 4. Small PFO noted. RECOMMENDATIONS: 1. Mild non-obstructive CAD (25-49%). Consider non-atherosclerotic causes of chest pain. Consider preventive therapy and risk factor modification. Darryle Decent, MD Electronically Signed   By: Darryle Decent   On: 10/05/2019 18:59   Result Date: 10/05/2019 EXAM: OVER-READ INTERPRETATION  CT CHEST The following report is an over-read performed by radiologist Dr. Marcey Moan of [facility] on 10/05/2019. This over-read  does not include interpretation of cardiac or coronary anatomy or pathology. The coronary CTA interpretation by the cardiologist is attached. COMPARISON:  Coronary calcium  study on 07/04/2018 FINDINGS: Vascular: No incidental findings. Mediastinum/Nodes: Visualized mediastinum and hilar regions demonstrate no lymphadenopathy or focal masses. Lungs/Pleura: There is no evidence of pulmonary edema, consolidation, pneumothorax, nodule or pleural fluid. Upper Abdomen: No acute abnormality. Musculoskeletal: No chest wall mass or suspicious bone lesions identified. IMPRESSION: No incidental findings. Electronically Signed: By: Marcey Moan M.D. On: 10/05/2019 11:38    Assessment & Plan:   Problem List Items Addressed This Visit   None     Meds ordered this encounter  Medications   doxycycline  (VIBRA -TABS) 100 MG tablet    Sig: Take 1 tablet (100 mg total) by mouth 2 (two) times daily.    Dispense:  20 tablet    Refill:  0      Follow-up: No follow-ups on file.  Marolyn Noel, MD "

## 2024-10-02 NOTE — Assessment & Plan Note (Signed)
 Resolving on forehead Doxy if relapsed

## 2025-02-22 ENCOUNTER — Ambulatory Visit: Admitting: Internal Medicine

## 2025-03-02 ENCOUNTER — Ambulatory Visit
